# Patient Record
Sex: Female | Born: 1935 | Race: Black or African American | Hispanic: No | Smoking: Former smoker
Health system: Southern US, Community
[De-identification: ages and names within clinical notes are randomized; demographics above are authoritative.]

## PROBLEM LIST (undated history)

## (undated) DIAGNOSIS — I509 Heart failure, unspecified: Secondary | ICD-10-CM

## (undated) DIAGNOSIS — N189 Chronic kidney disease, unspecified: Secondary | ICD-10-CM

## (undated) DIAGNOSIS — E119 Type 2 diabetes mellitus without complications: Secondary | ICD-10-CM

## (undated) DIAGNOSIS — I214 Non-ST elevation (NSTEMI) myocardial infarction: Secondary | ICD-10-CM

## (undated) DIAGNOSIS — R001 Bradycardia, unspecified: Secondary | ICD-10-CM

## (undated) DIAGNOSIS — Z515 Encounter for palliative care: Secondary | ICD-10-CM

## (undated) DIAGNOSIS — G459 Transient cerebral ischemic attack, unspecified: Secondary | ICD-10-CM

## (undated) DIAGNOSIS — I2510 Atherosclerotic heart disease of native coronary artery without angina pectoris: Secondary | ICD-10-CM

## (undated) DIAGNOSIS — I219 Acute myocardial infarction, unspecified: Secondary | ICD-10-CM

## (undated) DIAGNOSIS — E785 Hyperlipidemia, unspecified: Secondary | ICD-10-CM

## (undated) DIAGNOSIS — I10 Essential (primary) hypertension: Secondary | ICD-10-CM

## (undated) DIAGNOSIS — M1990 Unspecified osteoarthritis, unspecified site: Secondary | ICD-10-CM

## (undated) HISTORY — PX: EYE SURGERY: SHX253

---

## 1997-06-20 ENCOUNTER — Ambulatory Visit (HOSPITAL_COMMUNITY): Admission: RE | Admit: 1997-06-20 | Discharge: 1997-06-20 | Payer: Self-pay

## 1997-07-17 ENCOUNTER — Ambulatory Visit (HOSPITAL_COMMUNITY): Admission: RE | Admit: 1997-07-17 | Discharge: 1997-07-17 | Payer: Self-pay | Admitting: Internal Medicine

## 1997-08-30 ENCOUNTER — Other Ambulatory Visit: Admission: RE | Admit: 1997-08-30 | Discharge: 1997-08-30 | Payer: Self-pay | Admitting: Internal Medicine

## 1998-07-02 ENCOUNTER — Other Ambulatory Visit: Admission: RE | Admit: 1998-07-02 | Discharge: 1998-07-02 | Payer: Self-pay | Admitting: Family Medicine

## 1998-07-04 ENCOUNTER — Encounter: Payer: Self-pay | Admitting: Internal Medicine

## 1998-07-04 ENCOUNTER — Ambulatory Visit (HOSPITAL_COMMUNITY): Admission: RE | Admit: 1998-07-04 | Discharge: 1998-07-04 | Payer: Self-pay | Admitting: Internal Medicine

## 1999-07-16 ENCOUNTER — Other Ambulatory Visit: Admission: RE | Admit: 1999-07-16 | Discharge: 1999-07-16 | Payer: Self-pay | Admitting: Family Medicine

## 2000-09-06 ENCOUNTER — Ambulatory Visit (HOSPITAL_COMMUNITY): Admission: RE | Admit: 2000-09-06 | Discharge: 2000-09-06 | Payer: Self-pay | Admitting: Family Medicine

## 2001-03-08 ENCOUNTER — Other Ambulatory Visit: Admission: RE | Admit: 2001-03-08 | Discharge: 2001-03-08 | Payer: Self-pay | Admitting: Family Medicine

## 2001-08-24 ENCOUNTER — Ambulatory Visit (HOSPITAL_COMMUNITY): Admission: RE | Admit: 2001-08-24 | Discharge: 2001-08-24 | Payer: Self-pay | Admitting: Obstetrics & Gynecology

## 2001-12-19 ENCOUNTER — Other Ambulatory Visit: Admission: RE | Admit: 2001-12-19 | Discharge: 2001-12-19 | Payer: Self-pay | Admitting: *Deleted

## 2002-12-21 ENCOUNTER — Ambulatory Visit: Admission: RE | Admit: 2002-12-21 | Discharge: 2002-12-21 | Payer: Self-pay | Admitting: Unknown Physician Specialty

## 2002-12-21 ENCOUNTER — Encounter: Payer: Self-pay | Admitting: Cardiovascular Disease

## 2002-12-26 ENCOUNTER — Ambulatory Visit (HOSPITAL_COMMUNITY): Admission: RE | Admit: 2002-12-26 | Discharge: 2002-12-26 | Payer: Self-pay | Admitting: Family Medicine

## 2002-12-26 ENCOUNTER — Encounter: Payer: Self-pay | Admitting: Occupational Therapy

## 2003-01-04 DIAGNOSIS — H903 Sensorineural hearing loss, bilateral: Secondary | ICD-10-CM

## 2003-01-18 ENCOUNTER — Ambulatory Visit (HOSPITAL_COMMUNITY): Admission: RE | Admit: 2003-01-18 | Discharge: 2003-01-18 | Payer: Self-pay | Admitting: Family Medicine

## 2004-02-05 ENCOUNTER — Ambulatory Visit: Payer: Self-pay | Admitting: Family Medicine

## 2004-02-20 ENCOUNTER — Ambulatory Visit (HOSPITAL_COMMUNITY): Admission: RE | Admit: 2004-02-20 | Discharge: 2004-02-20 | Payer: Self-pay | Admitting: Family Medicine

## 2004-02-26 ENCOUNTER — Ambulatory Visit: Payer: Self-pay | Admitting: Family Medicine

## 2004-03-10 ENCOUNTER — Ambulatory Visit: Payer: Self-pay | Admitting: Family Medicine

## 2004-03-12 ENCOUNTER — Ambulatory Visit (HOSPITAL_COMMUNITY): Admission: RE | Admit: 2004-03-12 | Discharge: 2004-03-12 | Payer: Self-pay | Admitting: Family Medicine

## 2004-04-11 ENCOUNTER — Ambulatory Visit: Payer: Self-pay | Admitting: Family Medicine

## 2004-04-23 ENCOUNTER — Ambulatory Visit: Payer: Self-pay | Admitting: Family Medicine

## 2004-06-17 ENCOUNTER — Ambulatory Visit: Payer: Self-pay | Admitting: Family Medicine

## 2004-10-29 ENCOUNTER — Ambulatory Visit: Payer: Self-pay | Admitting: Family Medicine

## 2005-04-10 ENCOUNTER — Ambulatory Visit (HOSPITAL_COMMUNITY): Admission: RE | Admit: 2005-04-10 | Discharge: 2005-04-10 | Payer: Self-pay | Admitting: Family Medicine

## 2005-05-20 ENCOUNTER — Ambulatory Visit: Payer: Self-pay | Admitting: Family Medicine

## 2005-06-01 ENCOUNTER — Ambulatory Visit: Payer: Self-pay | Admitting: Family Medicine

## 2005-06-05 ENCOUNTER — Ambulatory Visit: Payer: Self-pay | Admitting: Family Medicine

## 2005-06-12 ENCOUNTER — Ambulatory Visit: Payer: Self-pay | Admitting: Family Medicine

## 2005-06-24 ENCOUNTER — Ambulatory Visit: Payer: Self-pay | Admitting: Family Medicine

## 2005-08-27 ENCOUNTER — Ambulatory Visit: Payer: Self-pay | Admitting: Family Medicine

## 2005-09-14 ENCOUNTER — Ambulatory Visit: Payer: Self-pay | Admitting: Family Medicine

## 2005-09-18 ENCOUNTER — Ambulatory Visit: Payer: Self-pay | Admitting: Cardiology

## 2005-09-21 ENCOUNTER — Ambulatory Visit: Payer: Self-pay | Admitting: Family Medicine

## 2005-10-08 ENCOUNTER — Ambulatory Visit: Payer: Self-pay

## 2005-10-08 ENCOUNTER — Encounter: Payer: Self-pay | Admitting: Cardiology

## 2005-10-09 ENCOUNTER — Ambulatory Visit: Payer: Self-pay | Admitting: Family Medicine

## 2005-10-12 ENCOUNTER — Ambulatory Visit: Payer: Self-pay | Admitting: Family Medicine

## 2005-10-23 ENCOUNTER — Ambulatory Visit: Payer: Self-pay | Admitting: Cardiology

## 2005-11-26 ENCOUNTER — Ambulatory Visit: Payer: Self-pay | Admitting: Family Medicine

## 2006-01-14 ENCOUNTER — Ambulatory Visit: Payer: Self-pay | Admitting: Family Medicine

## 2006-01-21 ENCOUNTER — Ambulatory Visit: Payer: Self-pay | Admitting: Family Medicine

## 2006-02-22 ENCOUNTER — Encounter: Admission: RE | Admit: 2006-02-22 | Discharge: 2006-02-22 | Payer: Self-pay | Admitting: Nephrology

## 2006-03-18 ENCOUNTER — Ambulatory Visit: Payer: Self-pay | Admitting: Family Medicine

## 2006-05-06 ENCOUNTER — Ambulatory Visit: Payer: Self-pay | Admitting: Internal Medicine

## 2006-05-12 ENCOUNTER — Ambulatory Visit: Payer: Self-pay | Admitting: Family Medicine

## 2006-08-27 ENCOUNTER — Inpatient Hospital Stay (HOSPITAL_COMMUNITY): Admission: EM | Admit: 2006-08-27 | Discharge: 2006-08-31 | Payer: Self-pay | Admitting: Emergency Medicine

## 2006-08-27 ENCOUNTER — Ambulatory Visit: Payer: Self-pay | Admitting: Cardiology

## 2006-08-27 DIAGNOSIS — Z9889 Other specified postprocedural states: Secondary | ICD-10-CM

## 2006-08-27 DIAGNOSIS — I214 Non-ST elevation (NSTEMI) myocardial infarction: Secondary | ICD-10-CM

## 2006-08-27 HISTORY — DX: Non-ST elevation (NSTEMI) myocardial infarction: I21.4

## 2006-09-03 ENCOUNTER — Ambulatory Visit: Payer: Self-pay | Admitting: Cardiology

## 2006-09-03 LAB — CONVERTED CEMR LAB
CO2: 32 meq/L (ref 19–32)
Calcium: 9.3 mg/dL (ref 8.4–10.5)
Chloride: 105 meq/L (ref 96–112)
Creatinine, Ser: 1.2 mg/dL (ref 0.4–1.2)
GFR calc Af Amer: 56 mL/min
Glucose, Bld: 107 mg/dL — ABNORMAL HIGH (ref 70–99)

## 2006-09-07 ENCOUNTER — Ambulatory Visit: Payer: Self-pay | Admitting: Cardiology

## 2006-09-07 LAB — CONVERTED CEMR LAB
CO2: 29 meq/L (ref 19–32)
Chloride: 106 meq/L (ref 96–112)
Creatinine, Ser: 1.2 mg/dL (ref 0.4–1.2)
Glucose, Bld: 112 mg/dL — ABNORMAL HIGH (ref 70–99)
Sodium: 140 meq/L (ref 135–145)

## 2006-09-14 ENCOUNTER — Ambulatory Visit: Payer: Self-pay | Admitting: Family Medicine

## 2006-10-01 ENCOUNTER — Ambulatory Visit: Payer: Self-pay | Admitting: Cardiology

## 2006-10-01 ENCOUNTER — Observation Stay (HOSPITAL_COMMUNITY): Admission: EM | Admit: 2006-10-01 | Discharge: 2006-10-02 | Payer: Self-pay | Admitting: Emergency Medicine

## 2006-10-08 ENCOUNTER — Ambulatory Visit: Payer: Self-pay

## 2006-10-08 ENCOUNTER — Ambulatory Visit: Payer: Self-pay | Admitting: Cardiology

## 2006-10-08 LAB — CONVERTED CEMR LAB
Eosinophils Absolute: 0.2 10*3/uL (ref 0.0–0.6)
Lymphocytes Relative: 35.5 % (ref 12.0–46.0)
MCHC: 33.9 g/dL (ref 30.0–36.0)
MCV: 95.6 fL (ref 78.0–100.0)
Monocytes Absolute: 0.4 10*3/uL (ref 0.2–0.7)
Monocytes Relative: 6.9 % (ref 3.0–11.0)
Neutro Abs: 3.1 10*3/uL (ref 1.4–7.7)
Platelets: 191 10*3/uL (ref 150–400)

## 2006-10-22 ENCOUNTER — Ambulatory Visit: Payer: Self-pay | Admitting: Cardiology

## 2006-11-16 ENCOUNTER — Ambulatory Visit: Payer: Self-pay | Admitting: Cardiology

## 2006-11-29 ENCOUNTER — Ambulatory Visit: Payer: Self-pay | Admitting: Family Medicine

## 2006-12-15 ENCOUNTER — Emergency Department (HOSPITAL_COMMUNITY): Admission: EM | Admit: 2006-12-15 | Discharge: 2006-12-15 | Payer: Self-pay | Admitting: Emergency Medicine

## 2006-12-16 ENCOUNTER — Ambulatory Visit: Payer: Self-pay | Admitting: Cardiology

## 2006-12-21 ENCOUNTER — Ambulatory Visit: Payer: Self-pay | Admitting: Family Medicine

## 2006-12-21 DIAGNOSIS — I219 Acute myocardial infarction, unspecified: Secondary | ICD-10-CM | POA: Insufficient documentation

## 2006-12-21 DIAGNOSIS — K5730 Diverticulosis of large intestine without perforation or abscess without bleeding: Secondary | ICD-10-CM | POA: Insufficient documentation

## 2006-12-21 DIAGNOSIS — D649 Anemia, unspecified: Secondary | ICD-10-CM

## 2006-12-21 DIAGNOSIS — E785 Hyperlipidemia, unspecified: Secondary | ICD-10-CM

## 2006-12-21 DIAGNOSIS — E669 Obesity, unspecified: Secondary | ICD-10-CM

## 2006-12-21 DIAGNOSIS — R609 Edema, unspecified: Secondary | ICD-10-CM

## 2006-12-21 DIAGNOSIS — E119 Type 2 diabetes mellitus without complications: Secondary | ICD-10-CM

## 2006-12-21 DIAGNOSIS — J45909 Unspecified asthma, uncomplicated: Secondary | ICD-10-CM | POA: Insufficient documentation

## 2006-12-21 DIAGNOSIS — N189 Chronic kidney disease, unspecified: Secondary | ICD-10-CM | POA: Insufficient documentation

## 2006-12-22 ENCOUNTER — Ambulatory Visit: Payer: Self-pay

## 2006-12-22 ENCOUNTER — Encounter: Payer: Self-pay | Admitting: Cardiology

## 2006-12-22 ENCOUNTER — Encounter (INDEPENDENT_AMBULATORY_CARE_PROVIDER_SITE_OTHER): Payer: Self-pay | Admitting: Family Medicine

## 2006-12-22 LAB — CONVERTED CEMR LAB
ALT: 19 units/L (ref 0–35)
AST: 19 units/L (ref 0–37)
Basophils Relative: 0.5 % (ref 0.0–1.0)
Bilirubin, Direct: 0.1 mg/dL (ref 0.0–0.3)
CO2: 32 meq/L (ref 19–32)
Calcium: 9.8 mg/dL (ref 8.4–10.5)
Chloride: 104 meq/L (ref 96–112)
Cholesterol: 192 mg/dL (ref 0–200)
Eosinophils Absolute: 0.2 10*3/uL (ref 0.0–0.6)
Eosinophils Relative: 3.2 % (ref 0.0–5.0)
GFR calc non Af Amer: 39 mL/min
Glucose, Bld: 127 mg/dL — ABNORMAL HIGH (ref 70–99)
HCT: 33.6 % — ABNORMAL LOW (ref 36.0–46.0)
HDL: 34.3 mg/dL — ABNORMAL LOW (ref >39.0)
Lymphocytes Relative: 32.2 % (ref 12.0–46.0)
MCV: 94.7 fL (ref 78.0–100.0)
Neutrophils Relative %: 54.6 % (ref 43.0–77.0)
Prothrombin Time: 12.6 s (ref 10.9–13.3)
RBC: 3.55 M/uL — ABNORMAL LOW (ref 3.87–5.11)
Sodium: 141 meq/L (ref 135–145)
Total Bilirubin: 0.8 mg/dL (ref 0.3–1.2)
Total Protein: 7.1 g/dL (ref 6.0–8.3)
VLDL: 25 mg/dL (ref 0–40)
WBC: 5.3 10*3/uL (ref 4.5–10.5)
aPTT: 25.9 s (ref 21.7–29.8)

## 2006-12-23 ENCOUNTER — Ambulatory Visit: Payer: Self-pay | Admitting: Cardiology

## 2006-12-23 ENCOUNTER — Inpatient Hospital Stay (HOSPITAL_BASED_OUTPATIENT_CLINIC_OR_DEPARTMENT_OTHER): Admission: RE | Admit: 2006-12-23 | Discharge: 2006-12-23 | Payer: Self-pay | Admitting: Cardiology

## 2006-12-23 ENCOUNTER — Inpatient Hospital Stay (HOSPITAL_COMMUNITY): Admission: AD | Admit: 2006-12-23 | Discharge: 2006-12-28 | Payer: Self-pay | Admitting: Cardiology

## 2006-12-23 HISTORY — PX: CARDIAC CATHETERIZATION: SHX172

## 2007-01-04 ENCOUNTER — Ambulatory Visit: Payer: Self-pay | Admitting: Cardiology

## 2007-01-10 ENCOUNTER — Ambulatory Visit: Payer: Self-pay | Admitting: Cardiology

## 2007-01-19 ENCOUNTER — Telehealth (INDEPENDENT_AMBULATORY_CARE_PROVIDER_SITE_OTHER): Payer: Self-pay | Admitting: *Deleted

## 2007-02-11 ENCOUNTER — Encounter (INDEPENDENT_AMBULATORY_CARE_PROVIDER_SITE_OTHER): Payer: Self-pay | Admitting: Family Medicine

## 2007-02-18 ENCOUNTER — Telehealth (INDEPENDENT_AMBULATORY_CARE_PROVIDER_SITE_OTHER): Payer: Self-pay | Admitting: *Deleted

## 2007-02-24 ENCOUNTER — Encounter (INDEPENDENT_AMBULATORY_CARE_PROVIDER_SITE_OTHER): Payer: Self-pay | Admitting: Family Medicine

## 2007-03-01 ENCOUNTER — Ambulatory Visit: Payer: Self-pay | Admitting: Cardiology

## 2007-03-01 ENCOUNTER — Encounter (INDEPENDENT_AMBULATORY_CARE_PROVIDER_SITE_OTHER): Payer: Self-pay | Admitting: Family Medicine

## 2007-03-02 ENCOUNTER — Ambulatory Visit: Payer: Self-pay | Admitting: Family Medicine

## 2007-03-04 ENCOUNTER — Encounter (INDEPENDENT_AMBULATORY_CARE_PROVIDER_SITE_OTHER): Payer: Self-pay | Admitting: Family Medicine

## 2007-03-09 ENCOUNTER — Encounter (INDEPENDENT_AMBULATORY_CARE_PROVIDER_SITE_OTHER): Payer: Self-pay | Admitting: Family Medicine

## 2007-03-15 ENCOUNTER — Ambulatory Visit: Payer: Self-pay | Admitting: Cardiology

## 2007-03-15 ENCOUNTER — Inpatient Hospital Stay (HOSPITAL_COMMUNITY): Admission: EM | Admit: 2007-03-15 | Discharge: 2007-03-24 | Payer: Self-pay | Admitting: Emergency Medicine

## 2007-03-16 ENCOUNTER — Encounter: Payer: Self-pay | Admitting: Cardiothoracic Surgery

## 2007-03-16 ENCOUNTER — Ambulatory Visit: Payer: Self-pay | Admitting: Cardiothoracic Surgery

## 2007-03-16 ENCOUNTER — Ambulatory Visit: Payer: Self-pay | Admitting: Vascular Surgery

## 2007-03-16 HISTORY — PX: CARDIAC CATHETERIZATION: SHX172

## 2007-03-18 HISTORY — PX: CORONARY ANGIOPLASTY WITH STENT PLACEMENT: SHX49

## 2007-03-18 HISTORY — PX: CORONARY ARTERY BYPASS GRAFT: SHX141

## 2007-04-21 ENCOUNTER — Encounter: Admission: RE | Admit: 2007-04-21 | Discharge: 2007-04-21 | Payer: Self-pay | Admitting: Cardiothoracic Surgery

## 2007-04-21 ENCOUNTER — Ambulatory Visit: Payer: Self-pay | Admitting: Cardiothoracic Surgery

## 2007-04-25 ENCOUNTER — Ambulatory Visit: Payer: Self-pay | Admitting: Family Medicine

## 2007-04-26 ENCOUNTER — Ambulatory Visit: Payer: Self-pay | Admitting: Cardiology

## 2007-04-26 ENCOUNTER — Encounter (INDEPENDENT_AMBULATORY_CARE_PROVIDER_SITE_OTHER): Payer: Self-pay | Admitting: Family Medicine

## 2007-04-29 DIAGNOSIS — I2510 Atherosclerotic heart disease of native coronary artery without angina pectoris: Secondary | ICD-10-CM | POA: Insufficient documentation

## 2007-05-05 DIAGNOSIS — I219 Acute myocardial infarction, unspecified: Secondary | ICD-10-CM

## 2007-05-05 HISTORY — DX: Acute myocardial infarction, unspecified: I21.9

## 2007-06-09 ENCOUNTER — Encounter (INDEPENDENT_AMBULATORY_CARE_PROVIDER_SITE_OTHER): Payer: Self-pay | Admitting: Family Medicine

## 2007-06-23 ENCOUNTER — Ambulatory Visit: Payer: Self-pay | Admitting: Cardiology

## 2007-07-01 ENCOUNTER — Encounter (INDEPENDENT_AMBULATORY_CARE_PROVIDER_SITE_OTHER): Payer: Self-pay | Admitting: Family Medicine

## 2007-07-01 ENCOUNTER — Telehealth (INDEPENDENT_AMBULATORY_CARE_PROVIDER_SITE_OTHER): Payer: Self-pay | Admitting: *Deleted

## 2007-07-07 ENCOUNTER — Encounter (INDEPENDENT_AMBULATORY_CARE_PROVIDER_SITE_OTHER): Payer: Self-pay | Admitting: Family Medicine

## 2007-07-08 ENCOUNTER — Ambulatory Visit: Payer: Self-pay | Admitting: Cardiology

## 2007-07-08 LAB — CONVERTED CEMR LAB
BUN: 22 mg/dL (ref 6–23)
CO2: 34 meq/L — ABNORMAL HIGH (ref 19–32)
Calcium: 9.6 mg/dL (ref 8.4–10.5)
Chloride: 105 meq/L (ref 96–112)
Creatinine, Ser: 1.1 mg/dL (ref 0.4–1.2)
Pro B Natriuretic peptide (BNP): 328 pg/mL — ABNORMAL HIGH (ref 0.0–100.0)

## 2007-08-15 ENCOUNTER — Ambulatory Visit: Payer: Self-pay | Admitting: Family Medicine

## 2007-08-15 LAB — CONVERTED CEMR LAB: Blood Glucose, Fingerstick: 86

## 2007-08-18 ENCOUNTER — Encounter (INDEPENDENT_AMBULATORY_CARE_PROVIDER_SITE_OTHER): Payer: Self-pay | Admitting: Family Medicine

## 2007-08-18 ENCOUNTER — Ambulatory Visit: Payer: Self-pay | Admitting: Cardiology

## 2007-09-01 LAB — CONVERTED CEMR LAB
CO2: 33 meq/L — ABNORMAL HIGH (ref 19–32)
Chloride: 102 meq/L (ref 96–112)
Creatinine, Ser: 1.2 mg/dL (ref 0.4–1.2)
GFR calc non Af Amer: 46 mL/min
Potassium: 4.5 meq/L (ref 3.5–5.1)
Sodium: 139 meq/L (ref 135–145)

## 2007-09-02 ENCOUNTER — Ambulatory Visit: Payer: Self-pay | Admitting: Cardiology

## 2007-09-22 ENCOUNTER — Ambulatory Visit: Payer: Self-pay | Admitting: Cardiology

## 2007-10-10 ENCOUNTER — Encounter (INDEPENDENT_AMBULATORY_CARE_PROVIDER_SITE_OTHER): Payer: Self-pay | Admitting: Family Medicine

## 2007-10-17 ENCOUNTER — Ambulatory Visit (HOSPITAL_COMMUNITY): Admission: RE | Admit: 2007-10-17 | Discharge: 2007-10-17 | Payer: Self-pay | Admitting: Ophthalmology

## 2007-10-17 ENCOUNTER — Encounter (INDEPENDENT_AMBULATORY_CARE_PROVIDER_SITE_OTHER): Payer: Self-pay | Admitting: Family Medicine

## 2007-10-17 HISTORY — PX: CATARACT EXTRACTION W/ INTRAOCULAR LENS IMPLANT: SHX1309

## 2007-10-24 ENCOUNTER — Ambulatory Visit: Payer: Self-pay | Admitting: Cardiology

## 2007-10-25 ENCOUNTER — Encounter (INDEPENDENT_AMBULATORY_CARE_PROVIDER_SITE_OTHER): Payer: Self-pay | Admitting: Family Medicine

## 2007-12-15 ENCOUNTER — Encounter (HOSPITAL_COMMUNITY): Admission: RE | Admit: 2007-12-15 | Discharge: 2008-03-14 | Payer: Self-pay | Admitting: Cardiology

## 2007-12-27 ENCOUNTER — Ambulatory Visit: Payer: Self-pay | Admitting: Family Medicine

## 2007-12-27 LAB — CONVERTED CEMR LAB
Blood Glucose, Fingerstick: 146
Hgb A1c MFr Bld: 6 %

## 2007-12-28 ENCOUNTER — Encounter (INDEPENDENT_AMBULATORY_CARE_PROVIDER_SITE_OTHER): Payer: Self-pay | Admitting: Family Medicine

## 2007-12-28 LAB — CONVERTED CEMR LAB: Microalb, Ur: 0.2 mg/dL (ref 0.00–1.89)

## 2008-01-06 LAB — CONVERTED CEMR LAB
ALT: 17 units/L (ref 0–35)
AST: 20 units/L (ref 0–37)
Albumin: 4.3 g/dL (ref 3.5–5.2)
Basophils Absolute: 0 10*3/uL (ref 0.0–0.1)
Basophils Relative: 0 % (ref 0–1)
CO2: 28 meq/L (ref 19–32)
Calcium: 9.5 mg/dL (ref 8.4–10.5)
Chloride: 105 meq/L (ref 96–112)
Cholesterol: 162 mg/dL (ref 0–200)
Hemoglobin: 11.8 g/dL — ABNORMAL LOW (ref 12.0–15.0)
Lymphocytes Relative: 31 % (ref 12–46)
MCHC: 32.1 g/dL (ref 30.0–36.0)
Monocytes Absolute: 0.4 10*3/uL (ref 0.1–1.0)
Monocytes Relative: 8 % (ref 3–12)
Neutro Abs: 3.2 10*3/uL (ref 1.7–7.7)
Neutrophils Relative %: 59 % (ref 43–77)
Potassium: 4.9 meq/L (ref 3.5–5.3)
RBC: 3.78 M/uL — ABNORMAL LOW (ref 3.87–5.11)
TSH: 1.378 microintl units/mL (ref 0.350–4.50)

## 2008-02-01 ENCOUNTER — Encounter (INDEPENDENT_AMBULATORY_CARE_PROVIDER_SITE_OTHER): Payer: Self-pay | Admitting: Family Medicine

## 2008-02-10 ENCOUNTER — Ambulatory Visit: Payer: Self-pay | Admitting: Cardiology

## 2008-02-22 ENCOUNTER — Ambulatory Visit: Payer: Self-pay | Admitting: Cardiology

## 2008-02-22 LAB — CONVERTED CEMR LAB
CO2: 34 meq/L — ABNORMAL HIGH (ref 19–32)
Chloride: 104 meq/L (ref 96–112)
GFR calc non Af Amer: 39 mL/min
Potassium: 4.4 meq/L (ref 3.5–5.1)
Sodium: 143 meq/L (ref 135–145)

## 2008-03-12 ENCOUNTER — Ambulatory Visit: Payer: Self-pay | Admitting: Family Medicine

## 2008-03-12 DIAGNOSIS — M545 Low back pain: Secondary | ICD-10-CM

## 2008-03-12 LAB — CONVERTED CEMR LAB: Blood Glucose, Fingerstick: 199

## 2008-03-14 ENCOUNTER — Encounter (INDEPENDENT_AMBULATORY_CARE_PROVIDER_SITE_OTHER): Payer: Self-pay | Admitting: Family Medicine

## 2008-03-25 DIAGNOSIS — I5042 Chronic combined systolic (congestive) and diastolic (congestive) heart failure: Secondary | ICD-10-CM | POA: Insufficient documentation

## 2008-03-28 ENCOUNTER — Ambulatory Visit: Payer: Self-pay | Admitting: Family Medicine

## 2008-03-28 LAB — CONVERTED CEMR LAB
Chloride: 102 meq/L (ref 96–112)
Creatinine, Ser: 1.27 mg/dL — ABNORMAL HIGH (ref 0.40–1.20)
Potassium: 4.9 meq/L (ref 3.5–5.3)
Sodium: 139 meq/L (ref 135–145)

## 2008-04-13 ENCOUNTER — Encounter (INDEPENDENT_AMBULATORY_CARE_PROVIDER_SITE_OTHER): Payer: Self-pay | Admitting: Family Medicine

## 2008-04-18 ENCOUNTER — Ambulatory Visit: Payer: Self-pay | Admitting: Family Medicine

## 2008-04-18 LAB — CONVERTED CEMR LAB: Hgb A1c MFr Bld: 6.2 %

## 2008-05-08 ENCOUNTER — Ambulatory Visit (HOSPITAL_COMMUNITY): Admission: RE | Admit: 2008-05-08 | Discharge: 2008-05-08 | Payer: Self-pay | Admitting: Family Medicine

## 2008-05-10 ENCOUNTER — Encounter (INDEPENDENT_AMBULATORY_CARE_PROVIDER_SITE_OTHER): Payer: Self-pay | Admitting: Family Medicine

## 2008-06-05 ENCOUNTER — Encounter: Payer: Self-pay | Admitting: Cardiology

## 2008-06-05 ENCOUNTER — Ambulatory Visit: Payer: Self-pay | Admitting: Cardiology

## 2008-06-05 DIAGNOSIS — I6529 Occlusion and stenosis of unspecified carotid artery: Secondary | ICD-10-CM

## 2008-06-12 ENCOUNTER — Telehealth (INDEPENDENT_AMBULATORY_CARE_PROVIDER_SITE_OTHER): Payer: Self-pay | Admitting: *Deleted

## 2008-06-13 ENCOUNTER — Telehealth (INDEPENDENT_AMBULATORY_CARE_PROVIDER_SITE_OTHER): Payer: Self-pay | Admitting: *Deleted

## 2008-06-15 ENCOUNTER — Encounter (INDEPENDENT_AMBULATORY_CARE_PROVIDER_SITE_OTHER): Payer: Self-pay | Admitting: Family Medicine

## 2008-07-11 ENCOUNTER — Telehealth (INDEPENDENT_AMBULATORY_CARE_PROVIDER_SITE_OTHER): Payer: Self-pay | Admitting: *Deleted

## 2008-07-12 ENCOUNTER — Encounter (INDEPENDENT_AMBULATORY_CARE_PROVIDER_SITE_OTHER): Payer: Self-pay | Admitting: Family Medicine

## 2008-07-25 ENCOUNTER — Emergency Department (HOSPITAL_COMMUNITY): Admission: EM | Admit: 2008-07-25 | Discharge: 2008-07-25 | Payer: Self-pay | Admitting: Emergency Medicine

## 2008-07-25 ENCOUNTER — Encounter (INDEPENDENT_AMBULATORY_CARE_PROVIDER_SITE_OTHER): Payer: Self-pay | Admitting: Family Medicine

## 2008-07-27 ENCOUNTER — Encounter (INDEPENDENT_AMBULATORY_CARE_PROVIDER_SITE_OTHER): Payer: Self-pay | Admitting: Family Medicine

## 2008-07-27 ENCOUNTER — Emergency Department (HOSPITAL_COMMUNITY): Admission: EM | Admit: 2008-07-27 | Discharge: 2008-07-27 | Payer: Self-pay | Admitting: Family Medicine

## 2008-09-12 ENCOUNTER — Ambulatory Visit: Payer: Self-pay | Admitting: Cardiology

## 2008-09-12 DIAGNOSIS — E7800 Pure hypercholesterolemia, unspecified: Secondary | ICD-10-CM

## 2008-09-12 LAB — CONVERTED CEMR LAB
ALT: 15 units/L (ref 0–35)
Albumin: 3.6 g/dL (ref 3.5–5.2)
Bilirubin, Direct: 0.1 mg/dL (ref 0.0–0.3)
Cholesterol: 150 mg/dL (ref 0–200)
HDL: 43.4 mg/dL (ref >39.00)
Total Protein: 7.1 g/dL (ref 6.0–8.3)
Triglycerides: 82 mg/dL (ref 0.0–149.0)
VLDL: 16.4 mg/dL (ref 0.0–40.0)

## 2008-09-13 ENCOUNTER — Ambulatory Visit: Payer: Self-pay

## 2008-09-13 ENCOUNTER — Ambulatory Visit: Payer: Self-pay | Admitting: Cardiology

## 2008-09-13 ENCOUNTER — Encounter: Payer: Self-pay | Admitting: Cardiology

## 2008-09-19 ENCOUNTER — Encounter: Payer: Self-pay | Admitting: Cardiology

## 2008-09-20 ENCOUNTER — Encounter (INDEPENDENT_AMBULATORY_CARE_PROVIDER_SITE_OTHER): Payer: Self-pay | Admitting: Family Medicine

## 2008-09-26 ENCOUNTER — Ambulatory Visit: Payer: Self-pay | Admitting: Family Medicine

## 2008-09-26 LAB — CONVERTED CEMR LAB: Hgb A1c MFr Bld: 6.2 %

## 2008-10-11 DIAGNOSIS — Z8719 Personal history of other diseases of the digestive system: Secondary | ICD-10-CM | POA: Insufficient documentation

## 2008-10-12 ENCOUNTER — Inpatient Hospital Stay (HOSPITAL_COMMUNITY): Admission: EM | Admit: 2008-10-12 | Discharge: 2008-10-15 | Payer: Self-pay | Admitting: Emergency Medicine

## 2008-10-12 ENCOUNTER — Ambulatory Visit: Payer: Self-pay | Admitting: Cardiology

## 2008-10-13 ENCOUNTER — Ambulatory Visit: Payer: Self-pay | Admitting: Internal Medicine

## 2008-10-16 ENCOUNTER — Telehealth: Payer: Self-pay | Admitting: Cardiology

## 2008-10-17 ENCOUNTER — Telehealth (INDEPENDENT_AMBULATORY_CARE_PROVIDER_SITE_OTHER): Payer: Self-pay | Admitting: Internal Medicine

## 2008-10-19 ENCOUNTER — Encounter (INDEPENDENT_AMBULATORY_CARE_PROVIDER_SITE_OTHER): Payer: Self-pay | Admitting: Family Medicine

## 2008-10-22 ENCOUNTER — Telehealth (INDEPENDENT_AMBULATORY_CARE_PROVIDER_SITE_OTHER): Payer: Self-pay | Admitting: Family Medicine

## 2008-10-22 ENCOUNTER — Encounter (INDEPENDENT_AMBULATORY_CARE_PROVIDER_SITE_OTHER): Payer: Self-pay | Admitting: Nurse Practitioner

## 2008-10-31 ENCOUNTER — Encounter (INDEPENDENT_AMBULATORY_CARE_PROVIDER_SITE_OTHER): Payer: Self-pay | Admitting: Internal Medicine

## 2008-11-06 ENCOUNTER — Encounter (INDEPENDENT_AMBULATORY_CARE_PROVIDER_SITE_OTHER): Payer: Self-pay | Admitting: Internal Medicine

## 2008-11-06 ENCOUNTER — Ambulatory Visit: Payer: Self-pay | Admitting: Nurse Practitioner

## 2008-11-06 LAB — CONVERTED CEMR LAB
Basophils Absolute: 0 10*3/uL (ref 0.0–0.1)
Basophils Relative: 0 % (ref 0–1)
Eosinophils Relative: 2 % (ref 0–5)
Hemoglobin: 10.2 g/dL — ABNORMAL LOW (ref 12.0–15.0)
MCHC: 30.4 g/dL (ref 30.0–36.0)
Monocytes Absolute: 0.5 10*3/uL (ref 0.1–1.0)
Neutro Abs: 3.8 10*3/uL (ref 1.7–7.7)
RDW: 13.5 % (ref 11.5–15.5)

## 2008-11-07 ENCOUNTER — Encounter (INDEPENDENT_AMBULATORY_CARE_PROVIDER_SITE_OTHER): Payer: Self-pay | Admitting: Nurse Practitioner

## 2008-11-19 ENCOUNTER — Encounter: Payer: Self-pay | Admitting: Internal Medicine

## 2008-12-06 ENCOUNTER — Encounter: Payer: Self-pay | Admitting: Physician Assistant

## 2008-12-28 ENCOUNTER — Encounter (INDEPENDENT_AMBULATORY_CARE_PROVIDER_SITE_OTHER): Payer: Self-pay | Admitting: Internal Medicine

## 2009-01-01 ENCOUNTER — Encounter (INDEPENDENT_AMBULATORY_CARE_PROVIDER_SITE_OTHER): Payer: Self-pay | Admitting: *Deleted

## 2009-02-28 ENCOUNTER — Ambulatory Visit: Payer: Self-pay | Admitting: Physician Assistant

## 2009-02-28 LAB — CONVERTED CEMR LAB: Blood Glucose, Fingerstick: 117

## 2009-03-04 ENCOUNTER — Ambulatory Visit: Payer: Self-pay | Admitting: Internal Medicine

## 2009-03-04 DIAGNOSIS — R5383 Other fatigue: Secondary | ICD-10-CM

## 2009-03-04 DIAGNOSIS — R5381 Other malaise: Secondary | ICD-10-CM | POA: Insufficient documentation

## 2009-03-04 DIAGNOSIS — I5032 Chronic diastolic (congestive) heart failure: Secondary | ICD-10-CM

## 2009-03-05 ENCOUNTER — Encounter: Payer: Self-pay | Admitting: Physician Assistant

## 2009-03-27 ENCOUNTER — Ambulatory Visit: Payer: Self-pay | Admitting: Physician Assistant

## 2009-04-02 ENCOUNTER — Ambulatory Visit: Payer: Self-pay | Admitting: Physician Assistant

## 2009-04-02 DIAGNOSIS — R269 Unspecified abnormalities of gait and mobility: Secondary | ICD-10-CM | POA: Insufficient documentation

## 2009-04-02 LAB — CONVERTED CEMR LAB
CO2: 30 meq/L (ref 19–32)
Calcium: 9.4 mg/dL (ref 8.4–10.5)
Creatinine, Ser: 1.74 mg/dL — ABNORMAL HIGH (ref 0.40–1.20)
Glucose, Bld: 100 mg/dL — ABNORMAL HIGH (ref 70–99)

## 2009-04-03 ENCOUNTER — Encounter: Payer: Self-pay | Admitting: Physician Assistant

## 2009-04-19 ENCOUNTER — Ambulatory Visit (HOSPITAL_COMMUNITY): Admission: RE | Admit: 2009-04-19 | Discharge: 2009-04-19 | Payer: Self-pay | Admitting: Internal Medicine

## 2009-04-19 ENCOUNTER — Encounter: Payer: Self-pay | Admitting: Physician Assistant

## 2009-05-02 ENCOUNTER — Encounter: Admission: RE | Admit: 2009-05-02 | Discharge: 2009-05-07 | Payer: Self-pay | Admitting: Physician Assistant

## 2009-05-07 ENCOUNTER — Encounter: Admission: RE | Admit: 2009-05-07 | Discharge: 2009-05-31 | Payer: Self-pay | Admitting: Physician Assistant

## 2009-05-13 ENCOUNTER — Encounter: Payer: Self-pay | Admitting: Physician Assistant

## 2009-05-15 ENCOUNTER — Encounter: Payer: Self-pay | Admitting: Physician Assistant

## 2009-05-15 DIAGNOSIS — M810 Age-related osteoporosis without current pathological fracture: Secondary | ICD-10-CM | POA: Insufficient documentation

## 2009-05-16 ENCOUNTER — Encounter: Payer: Self-pay | Admitting: Physician Assistant

## 2009-05-19 ENCOUNTER — Encounter: Payer: Self-pay | Admitting: Physician Assistant

## 2009-05-30 ENCOUNTER — Encounter: Payer: Self-pay | Admitting: Physician Assistant

## 2009-05-30 ENCOUNTER — Encounter (INDEPENDENT_AMBULATORY_CARE_PROVIDER_SITE_OTHER): Payer: Self-pay | Admitting: *Deleted

## 2009-05-31 ENCOUNTER — Encounter: Payer: Self-pay | Admitting: Physician Assistant

## 2009-07-11 ENCOUNTER — Encounter: Payer: Self-pay | Admitting: Physician Assistant

## 2009-07-15 ENCOUNTER — Encounter: Payer: Self-pay | Admitting: Physician Assistant

## 2009-07-23 ENCOUNTER — Encounter: Payer: Self-pay | Admitting: Physician Assistant

## 2009-07-26 ENCOUNTER — Telehealth: Payer: Self-pay | Admitting: Physician Assistant

## 2009-08-15 ENCOUNTER — Ambulatory Visit: Payer: Self-pay | Admitting: Internal Medicine

## 2010-03-12 ENCOUNTER — Ambulatory Visit: Payer: Self-pay | Admitting: Nurse Practitioner

## 2010-03-20 ENCOUNTER — Ambulatory Visit: Payer: Self-pay | Admitting: Nurse Practitioner

## 2010-03-20 DIAGNOSIS — I10 Essential (primary) hypertension: Secondary | ICD-10-CM

## 2010-03-20 LAB — CONVERTED CEMR LAB
HDL goal, serum: 40 mg/dL
Hgb A1c MFr Bld: 6.1 %

## 2010-03-21 LAB — CONVERTED CEMR LAB
ALT: 15 units/L (ref 0–35)
Albumin: 4.2 g/dL (ref 3.5–5.2)
Basophils Absolute: 0 10*3/uL (ref 0.0–0.1)
CO2: 29 meq/L (ref 19–32)
Calcium: 8.9 mg/dL (ref 8.4–10.5)
Chloride: 101 meq/L (ref 96–112)
Creatinine, Ser: 1.24 mg/dL — ABNORMAL HIGH (ref 0.40–1.20)
Hemoglobin: 11.7 g/dL — ABNORMAL LOW (ref 12.0–15.0)
Lymphocytes Relative: 33 % (ref 12–46)
Monocytes Absolute: 0.7 10*3/uL (ref 0.1–1.0)
Monocytes Relative: 12 % (ref 3–12)
Neutro Abs: 3.2 10*3/uL (ref 1.7–7.7)
RBC: 3.64 M/uL — ABNORMAL LOW (ref 3.87–5.11)
RDW: 12.8 % (ref 11.5–15.5)
TSH: 1.064 microintl units/mL (ref 0.350–4.500)

## 2010-04-03 ENCOUNTER — Ambulatory Visit: Payer: Self-pay | Admitting: Internal Medicine

## 2010-04-18 ENCOUNTER — Ambulatory Visit: Payer: Self-pay | Admitting: Nurse Practitioner

## 2010-05-20 ENCOUNTER — Encounter (INDEPENDENT_AMBULATORY_CARE_PROVIDER_SITE_OTHER): Payer: Self-pay | Admitting: Internal Medicine

## 2010-05-24 ENCOUNTER — Encounter: Payer: Self-pay | Admitting: Occupational Therapy

## 2010-06-01 LAB — CONVERTED CEMR LAB
Blood Glucose, Fingerstick: 117
Nitrite: NEGATIVE
Specific Gravity, Urine: 1.025
WBC Urine, dipstick: NEGATIVE
pH: 6

## 2010-06-03 NOTE — Progress Notes (Signed)
Summary: Office Visit/DEPRESSION SCREENING  Office Visit/DEPRESSION SCREENING   Imported By: Arta Bruce 05/24/2009 09:59:31  _____________________________________________________________________  External Attachment:    Type:   Image     Comment:   External Document

## 2010-06-03 NOTE — Miscellaneous (Signed)
Summary: Rehab Report//DISCHARGE  Rehab Report//DISCHARGE   Imported By: Arta Bruce 07/29/2009 16:07:06  _____________________________________________________________________  External Attachment:    Type:   Image     Comment:   External Document

## 2010-06-03 NOTE — Miscellaneous (Signed)
Summary: Colonscopy 12/2008:  Needs Repeat 12/2013  Clinical Lists Changes  Observations: Added new observation of COLONRECACT: Repeat colonoscopy in 5 years.  (12/06/2008 11:03) Added new observation of COLONOSCOPY:  Results: Sessile Polyp.  Results: Hemorrhoids. ("medium")    Results: Diverticulosis.       Pathology:  Adenomatous polyp.  (Tubular Adenoma)      Location:  [facility].  (Dr. Jeani Hawking)  Performed for diverticular bleeding  (12/06/2008 11:03)      Colonoscopy  Procedure date:  12/06/2008  Findings:       Results: Sessile Polyp.  Results: Hemorrhoids. ('medium')    Results: Diverticulosis.       Pathology:  Adenomatous polyp.  (Tubular Adenoma)      Location:  [facility].  (Dr. Jeani Hawking)  Performed for diverticular bleeding   Comments:      Repeat colonoscopy in 5 years.

## 2010-06-03 NOTE — Miscellaneous (Signed)
Summary: Rehab Report//INITIAL SUMMARY  Rehab Report//INITIAL SUMMARY   Imported By: Arta Bruce 07/11/2009 15:57:00  _____________________________________________________________________  External Attachment:    Type:   Image     Comment:   External Document

## 2010-06-03 NOTE — Assessment & Plan Note (Signed)
Summary: HAD TO GO TO ER///[place]   Vital Signs:  Patient profile:   77 year old female Height:      61.5 inches Weight:      229 pounds BMI:     42.72 Temp:     97.2 degrees F oral Pulse rate:   76 / minute Pulse rhythm:   regular Resp:     18 per minute BP sitting:   160 / 82  (right arm) Cuff size:   large  Vitals Entered By: [place] Shannon (Sep 26, 2008 10:34 AM)  Serial Vital Signs/Assessments:  Time      Position  BP       Pulse  Resp  Temp     By 11:17 AM            130/64   80                    Beverley Fiedler MD  CC: pt is here for f/u...Marland KitchenMarland KitchenMarland Kitchen   Pain Assessment Patient in pain? yes     Location: joint Intensity: 6 Type: aching Onset of pain  Constant CBG Result 158 Comments     Primary Care Provider:  [facility]  CC:  pt is here for f/u......  .  History of Present Illness: Here for f/u on 07/27/2008 ER visit for bronchitis...given Doxy and tussionex. No further cough. No fevers. still occasionally feels SOB with her exercises([employer] aerobics for about 15 min).   Saw Dr.Hochrein,cards, last week.Had carotid dopplers done.  Meds reviewed(bottles) and pt had 2 bottles(cozaar and Losartin) plus she does not have her lasix. pt says has old bottle of lasix at home 20 mg and has been taking 2 two times a day.  Current Medications (verified): 1)  Levemir Flexpen 100 Unit/ml Soln (Insulin Detemir) .... 25  Units Subcutaneously Each Morning 2)  Plavix 75 Mg Tabs (Clopidogrel Bisulfate) .[name].. 1 By Mouth Qday 3)  Bayer Low Strength 81 Mg  Tbec (Aspirin) .[name].. 1 By Mouth Qday 4)  Advair Diskus 250-50 Mcg/dose Misc (Fluticasone-Salmeterol) .[name].. 1 Inh Bid 5)  Caltrate 600+d Plus 600-400 Mg-Unit  Chew (Calcium Carbonate-Vit D-Min) .[name].. 1 By Mouth Qday 6)  Coreg 12.5 Mg  Tabs (Carvedilol) .... Take 1 Tablet By Mouth Every 12 Hours(Per Dr.hochrein) 7)  Lasix 40 Mg Tabs (Furosemide) .... Two Times A Day 8)  Potassium Chloride Crys Cr 20 Meq  Tbcr (Potassium Chloride  Crys Cr) .... Take 2 Tablet Once By Mouth Daily 9)  Levemir Flexpen Needles .... Use For Daily Levemir Injection. 10)  Cozaar 50 Mg Tabs (Losartan Potassium) .... Two Times A Day 11)  Simvastatin 40 Mg Tabs (Simvastatin) .... Take One Tablet At Bedtime  Allergies (verified): 1)  ! * Avoid Nsaids  Past History:  Past Medical History: Reviewed history from 03/25/2008 and no changes required.  1. Coronary artery disease, status post CABG   2. Mild LV dysfunction (EF 40%-50%).   3. Hypertension.   4. Morbid obesity.   5. Diabetes mellitus.   6. Dyslipidemia.   7. Asthma.   8. Renal insufficiency.   9. Hyponatremia.   10.Diastolic heart failure.   Past Surgical History: Reviewed history from 03/25/2008 and no changes required. Coronary artery bypass graft; 03/18/2007 (Dr.Gerhardt LIMA to the LAD, SVG to       distal circumflex, and SVG to right coronary artery)  Physical Exam  General:  Well-developed,well-nourished,in no acute distress; alert,appropriate and cooperative throughout examination Lungs:  Normal  respiratory effort, chest expands symmetrically. Lungs are clear to auscultation, no crackles or wheezes. Heart:  Normal rate and regular rhythm. S1 and S2 normal without gallop, murmur, click, rub or other extra sounds. Extremities:  No CCE.  Diabetes Management Exam:    Foot Exam (with socks and/or shoes not present):       Sensory-Monofilament:          Left foot: normal          Right foot: normal   Impression & Recommendations:  Problem # 1:  DIABETES MELLITUS, TYPE II (ICD-250.00) HgA1c=6.2% Pt to see Eye MD,Dr.Gary Rankin, this Friday. Td and pneumovax UTD. Had recent labs done per Dr.Hochrein and present in EMR.  Her updated medication list for this problem includes:    Levemir Flexpen 100 Unit/ml Soln (Insulin detemir) .[name]... 25  units subcutaneously each morning    Bayer Low Strength 81 Mg Tbec (Aspirin) .[name]... 1 by mouth qday    Cozaar 50 Mg Tabs (Losartan  potassium) .[name]..[name] Two times a day  Problem # 2:  Bronchitis dx'd ER 07/2008 Seems to have resolved.  Complete Medication List: 1)  Levemir Flexpen 100 Unit/ml Soln (Insulin detemir) .... 25  units subcutaneously each morning 2)  Plavix 75 Mg Tabs (Clopidogrel bisulfate) .[name].. 1 by mouth qday 3)  Bayer Low Strength 81 Mg Tbec (Aspirin) .[name].. 1 by mouth qday 4)  Advair Diskus 250-50 Mcg/dose Misc (Fluticasone-salmeterol) .[name].. 1 inh bid 5)  Caltrate 600+d Plus 600-400 Mg-unit Chew (Calcium carbonate-vit d-min) .[name].. 1 by mouth qday 6)  Coreg 12.5 Mg Tabs (Carvedilol) .... Take 1 tablet by mouth every 12 hours(per dr.hochrein) 7)  Lasix 40 Mg Tabs (Furosemide) .... Two times a day 8)  Potassium Chloride Crys Cr 20 Meq Tbcr (Potassium chloride crys cr) .... Take 2 tablet once by mouth daily 9)  Levemir Flexpen Needles  .... Use for daily levemir injection. 10)  Cozaar 50 Mg Tabs (Losartan potassium) .... Two times a day 11)  Simvastatin 40 Mg Tabs (Simvastatin) .... Take one tablet at bedtime  Patient Instructions: 1)  Please schedule a follow-up appointment in 6 months or as needed. Prescriptions: LEVEMIR FLEXPEN 100 UNIT/ML SOLN (INSULIN DETEMIR) 25  units subcutaneously each morning  #1 month x 5   Entered and Authorized by:   Beverley Fiedler MD   Signed by:   Beverley Fiedler MD on 09/26/2008   Method used:   Electronically to        [facility]  [id number]* (retail)       [address]       [place], [place]  [address]       Ph: [phone] or [phone]       Fax: [phone]   RxID:   [id number]   Laboratory Results   Blood Tests   Date/Time Received: Sep 26, 2008 10:40 AM   HGBA1C: 6.2%   (Normal Range: Non-Diabetic - 3-6%   Control Diabetic - 6-8%) CBG Random:: 158mg /dL       Last LDL:                                                 90 (09/12/2008 9:17:57 AM)        Diabetic Foot Exam Foot Inspection Is there a  history of a  foot ulcer?              No Is there a foot ulcer now?              No Can the patient see the bottom of their feet?          Yes Are the shoes appropriate in style and fit?          Yes Is there swelling or an abnormal foot shape?          No Are the toenails long?                No Are the toenails thick?                Yes Are the toenails ingrown?              No Is there heavy callous build-up?              Yes Is there a claw toe deformity?                          No Is there elevated skin temperature?            No Is there limited ankle dorsiflexion?            No Is there foot or ankle muscle weakness?            No Do you have pain in calf while walking?           No         10-g (5.07) Semmes-Weinstein Monofilament Test Performed by: [place] Shannon          Right Foot          Left Foot Visual Inspection               Test Control      normal         normal Site 1         normal         normal Site 2         normal         normal Site 3         normal         normal Site 4         normal         normal Site 5         normal         normal Site 6         normal         normal Site 7         normal         normal Site 8         normal         normal Site 9         normal         normal Site 10         normal         normal  Impression      normal         normal

## 2010-06-03 NOTE — Letter (Signed)
Summary: [facility]ILED REQUESTED RECORDS TO [facility]  MAILED REQUESTED RECORDS TO [facility]   Imported By: Arta Bruce 09/16/2009 16:04:05  _____________________________________________________________________  External Attachment:    Type:   Image     Comment:   External Document

## 2010-06-03 NOTE — Progress Notes (Signed)
  Phone Note Outgoing Call   Summary of Call: Rec'd notification patient may not be filling simvastatin. Please find out if she is taking simvastatin. If not, is she taking something else for chol.? If not, why is she not taking anything? Initial call taken by: Brynda Rim,  July 26, 2009 4:48 PM  Follow-up for Phone Call        spoke with pt and she is no longer with [place] because of her insurance and she says she has another primary provider now Follow-up by: [place] Shannon,  July 29, 2009 3:39 PM  Additional Follow-up for Phone Call Additional follow up Details #1::        ok Additional Follow-up by: Tereso Newcomer PA-C,  July 29, 2009 5:18 PM

## 2010-06-03 NOTE — Assessment & Plan Note (Signed)
Summary: Diabetes/HTN   Vital Signs:  Patient profile:   77 year old female Menstrual status:  postmenopausal Weight:      230.0 pounds BMI:     43.62 Temp:     97.1 degrees F oral Pulse rate:   90 / minute Pulse rhythm:   regular Resp:     20 per minute BP sitting:   210 / 90  (left arm) Cuff size:   regular  Vitals Entered By: Levon Hedger (March 20, 2010 12:24 PM)  Nutrition Counseling: Patient's BMI is greater than 25 and therefore counseled on weight management options.  Serial Vital Signs/Assessments:  Time      Position  BP       Pulse  Resp  Temp     By                     200/94   88                    Arm[place] Shannon  CC: renew medication has been without medication over a week, Lipid Management, Hypertension Management Is Patient Diabetic? Yes Pain Assessment Patient in pain? no      CBG Result 184 CBG Device ID B  Does patient need assistance? Functional Status Self care Ambulation Normal     Menstrual Status postmenopausal   Primary Care Provider:  [facility]  CC:  renew medication has been without medication over a week, Lipid Management, and Hypertension Management.  History of Present Illness:  Pt into the office for f/u. Reports that she has changed providers since her last visit here due to insurance coverage. She has now decided to return to this office for primary care. Pt reports that her medications were changed in that office and she did NOT take them as ordered because she felt she was stable on her previous medicatons.  Pt did NOT take any medications today.    Hypertension History:      She complains of peripheral edema, but denies headache, chest pain, and palpitations.  pt did not take her blood pressure medications as ordered.        Positive major cardiovascular risk factors include female age 11 years old or older, diabetes, hyperlipidemia, and hypertension.  Negative major cardiovascular risk factors include  non-tobacco-user status.        Positive history for target organ damage include ASHD (either angina/prior MI/prior CABG) and cardiac end organ damage (either CHF or LVH).    Lipid Management History:      Positive NCEP/ATP III risk factors include female age 37 years old or older, diabetes, hypertension, and ASHD (either angina/prior MI/prior CABG).  Negative NCEP/ATP III risk factors include non-tobacco-user status.        Comments include: pt is not fasting today for labs and she has not taking cholesterol medications consecutively over the past 2 weeks.      Allergies (verified): 1)  ! * Avoid Nsaids  Review of Systems General:  Denies fever. CV:  Complains of shortness of breath with exertion and swelling of feet; denies chest pain or discomfort. Resp:  Denies cough. GI:  Denies abdominal pain, nausea, and vomiting. MS:  Complains of low back pain.  Physical Exam  General:  alert.   Head:  normocephalic.   Lungs:  normal breath sounds.   Heart:  normal rate and regular rhythm.   Extremities:  1+ left pedal edema and 1+ right pedal edema.  Neurologic:  alert & oriented X3.   Skin:  color normal.   Psych:  Oriented X3.    Diabetes Management Exam:    Foot Exam (with socks and/or shoes not present):       Sensory-Monofilament:          Left foot: normal          Right foot: normal   Impression & Recommendations:  Problem # 1:  HYPERTENSION, BENIGN ESSENTIAL (ICD-401.1) BP VERY elevated today advised pt to restart on medications clonidine 0.1mg  by mouth given in office today DASH diet reviewed Her updated medication list for this problem includes:    Lasix 40 Mg Tabs (Furosemide) .[name]... Take one tablet by mouth twice daily.    Cozaar 100 Mg Tabs (Losartan potassium) .[name]... Take 1 tablet by mouth once a day    Carvedilol 12.5 Mg Tabs (Carvedilol) .[name]... Take 1 tablet by mouth twice a day  Problem # 2:  DIABETES MELLITUS, TYPE II (ICD-250.00) pt is stable  continue  current medications Her updated medication list for this problem includes:    Levemir Flexpen 100 Unit/ml Soln (Insulin detemir) .[name]... 20  units subcutaneously each morning    Cozaar 100 Mg Tabs (Losartan potassium) .[name]... Take 1 tablet by mouth once a day  Orders: Capillary Blood Glucose/CBG (82948) Hemoglobin A1C (83036)  Problem # 3:  PURE HYPERCHOLESTEROLEMIA (ICD-272.0)  Her updated medication list for this problem includes:    Simvastatin 20 Mg Tabs (Simvastatin) ..... One tablet by mouth nightly for cholesterol  Problem # 4:  ANEMIA, NORMOCYTIC, CHRONIC (ICD-285.9)  Orders: T-CBC w/Diff ([address])  Problem # 5:  OBESITY NOS (ICD-278.00)  Orders: T-TSH ([address]-91478)  Problem # 6:  DEPENDENT EDEMA, LEGS, BILATERAL (ICD-782.3)  Her updated medication list for this problem includes:    Lasix 40 Mg Tabs (Furosemide) .[name]... Take one tablet by mouth twice daily. Pt reports that she is not able to get her medications today will complete forms so that pt can get medications [facility] so that medications will be delivered to pts house  Complete Medication List: 1)  Levemir Flexpen 100 Unit/ml Soln (Insulin detemir) .... 20  units subcutaneously each morning 2)  Advair Diskus 250-50 Mcg/dose Misc (Fluticasone-salmeterol) .... One inhalation twice daily 3)  Caltrate 600+d Plus 600-400 Mg-unit Chew (Calcium carbonate-vit d-min) .... Take 1 tablet by mouth two times a day 4)  Lasix 40 Mg Tabs (Furosemide) .... Take one tablet by mouth twice daily. 5)  Potassium Chloride Crys Cr 20 Meq Tbcr (Potassium chloride crys cr) .... One tablet by mouth two times a day 6)  Novofine 30g X 8 Mm Misc (Insulin pen needle) .... To use with levemir flexpen 7)  Cozaar 100 Mg Tabs (Losartan potassium) .... Take 1 tablet by mouth once a day 8)  Simvastatin 20 Mg Tabs (Simvastatin) .... One tablet by mouth nightly for cholesterol 9)  Carvedilol 12.5 Mg Tabs (Carvedilol) ....  Take 1 tablet by mouth twice a day 10)  Singulair 10 Mg Tabs (Montelukast sodium) .... One tablet by mouth nightly for allergies/asthma  Other Orders: T-Comprehensive Metabolic Panel ([address]-13086)  Hypertension Assessment/Plan:      The patient's hypertensive risk group is category C: Target organ damage and/or diabetes.  Today's blood pressure is 210/90.  Her blood pressure goal is < 130/80.  Lipid Assessment/Plan:      Based on NCEP/ATP III, the patient's risk factor category is "history of coronary disease, peripheral vascular disease, cerebrovascular disease,  or aortic aneurysm along with either diabetes, current smoker, or LDL > 130 plus HDL < 40 plus triglycerides > 200".  The patient's lipid goals are as follows: Total cholesterol goal is 200; LDL cholesterol goal is 70; HDL cholesterol goal is 40; Triglyceride goal is 150.    Patient Instructions: 1)  Restart on all your medications as previously prescribed in this office. 2)  I will contact [facility] so that they can deliver the medications to your home. 3)  They will contact you. 4)  Follow up in 2 weeks for triage visit - blood pressure check  5)  goal < 140/90 Prescriptions: POTASSIUM CHLORIDE CRYS CR 20 MEQ  TBCR (POTASSIUM CHLORIDE CRYS CR) One tablet by mouth two times a day  #60 x 5   Entered and Authorized by:   Lehman Prom FNP   Signed by:   Lehman Prom FNP on 03/20/2010   Method used:   Print then Give to Patient   RxID:   [id number] SIMVASTATIN 20 MG TABS (SIMVASTATIN) One tablet by mouth nightly for cholesterol  #30 x 5   Entered and Authorized by:   Lehman Prom FNP   Signed by:   Lehman Prom FNP on 03/20/2010   Method used:   Print then Give to Patient   RxID:   [id number] CARVEDILOL 12.5 MG TABS (CARVEDILOL) Take 1 tablet by mouth twice a day  #60 Tablet x 5   Entered and Authorized by:   Lehman Prom FNP   Signed by:   Lehman Prom FNP on 03/20/2010   Method  used:   Print then Give to Patient   RxID:   [id number] SINGULAIR 10 MG TABS (MONTELUKAST SODIUM) One tablet by mouth nightly for allergies/asthma  #30 Tablet x 5   Entered and Authorized by:   Lehman Prom FNP   Signed by:   Lehman Prom FNP on 03/20/2010   Method used:   Print then Give to Patient   RxID:   [id number] COZAAR 100 MG TABS (LOSARTAN POTASSIUM) Take 1 tablet by mouth once a day  #30 x 5   Entered and Authorized by:   Lehman Prom FNP   Signed by:   Lehman Prom FNP on 03/20/2010   Method used:   Print then Give to Patient   RxID:   [id number] LASIX 40 MG TABS (FUROSEMIDE) Take one tablet by mouth twice daily.  #60 x 5   Entered and Authorized by:   Lehman Prom FNP   Signed by:   Lehman Prom FNP on 03/20/2010   Method used:   Print then Give to Patient   RxID:   [id number] ADVAIR DISKUS 250-50 MCG/DOSE MISC (FLUTICASONE-SALMETEROL) One inhalation twice daily  #1 x 5   Entered and Authorized by:   Lehman Prom FNP   Signed by:   Lehman Prom FNP on 03/20/2010   Method used:   Print then Give to Patient   RxID:   [id number] LEVEMIR FLEXPEN 100 UNIT/ML SOLN (INSULIN DETEMIR) 20  units subcutaneously each morning  #1 month qs x 5   Entered and Authorized by:   Lehman Prom FNP   Signed by:   Lehman Prom FNP on 03/20/2010   Method used:   Print then Give to Patient   RxID:   [id number]   Diabetic Foot Exam Last Podiatry Exam Date: 02/28/2009 Foot Inspection Is there a history of a foot ulcer?  No Is there a foot ulcer now?              No Is there swelling or an abnormal foot shape?          No Are the toenails long?                Yes Are the toenails thick?                Yes Are the toenails ingrown?              No Is there heavy callous build-up?              No Is there pain in the calf muscle (Intermittent claudication) when walking?    NoIs there a claw toe deformity?               No Is there elevated skin temperature?            No Is there limited ankle dorsiflexion?            No Is there foot or ankle muscle weakness?            No  Diabetic Foot Care Education Patient educated on appropriate care of diabetic feet.  Pulse Check          Right Foot          Left Foot Dorsalis Pedis:        normal            normal    10-g (5.07) Semmes-Weinstein Monofilament Test Performed by: Levon Hedger          Right Foot          Left Foot Visual Inspection                 Orders Added: 1)  Capillary Blood Glucose/CBG [82948] 2)  Est. Patient Level IV [21308] 3)  Hemoglobin A1C [83036] 4)  T-Comprehensive Metabolic Panel [80053-22900] 5)  T-CBC w/Diff [65784-69629] 6)  T-TSH [52841-32440]     Last LDL:                                                 90 (09/12/2008 9:17:57 AM)        Diabetic Foot Exam Last Podiatry Exam Date: 02/28/2009 Diabetic Foot Care Education :Patient educated on appropriate care of diabetic feet.  Pulse Check          Right Foot          Left Foot Dorsalis Pedis:        normal            normal    10-g (5.07) Semmes-Weinstein Monofilament Test Performed by: Levon Hedger          Right Foot          Left Foot Visual Inspection               Test Control      normal         normal Site 1         abnormal         normal Site 2         normal         normal Site 3  normal         normal Site 4         abnormal         normal Site 5         normal         normal Site 6         normal         normal Site 7         abnormal         normal Site 8         normal         normal Site 9         abnormal         normal Site 10         normal         normal  Impression      normal         normal   Laboratory Results   Blood Tests   Date/Time Received: March 20, 2010 1:11 PM   HGBA1C: 6.1%   (Normal Range: Non-Diabetic - 3-6%   Control Diabetic - 6-8%) CBG Random:: 184mg /dL

## 2010-06-03 NOTE — Medication Information (Signed)
Summary: [facility]/dr.'s order //faxed  [facility]/dr[facility]s order //faxed   Imported By: Arta Bruce 07/11/2009 10:31:57  _____________________________________________________________________  External Attachment:    Type:   Image     Comment:   External Document

## 2010-06-03 NOTE — Letter (Signed)
Summary: DEXA SCAN REPORT  DEXA SCAN REPORT   Imported By: Arta Bruce 05/28/2009 10:02:54  _____________________________________________________________________  External Attachment:    Type:   Image     Comment:   External Document

## 2010-06-03 NOTE — Letter (Signed)
Summary: colonoscopy report  colonoscopy report   Imported By: Arta Bruce 06/20/2009 11:55:18  _____________________________________________________________________  External Attachment:    Type:   Image     Comment:   External Document

## 2010-06-03 NOTE — Letter (Signed)
Summary: STATIN USE IN DIABETIC PT  STATIN USE IN DIABETIC PT   Imported By: Arta Bruce 10/07/2009 10:58:52  _____________________________________________________________________  External Attachment:    Type:   Image     Comment:   External Document

## 2010-06-03 NOTE — Letter (Signed)
Summary: *HSN Results Follow up  [facility]  [address] [place], [place] [address]   Phone: [phone]  Fax: [phone]      05/30/2009   BEVIN DAS [address] [place], [place]  [address]   Dear  [name],                            ____S.Drinkard,FNP   ____D. Gore,FNP       ____B. McPherson,MD   ____V. Rankins,MD    ____E. Mulberry,MD    ____N. Daphine Deutscher, FNP  ____D. Reche Dixon, MD    ____K. Philipp Deputy, MD    ____Other     This letter is to inform you that your recent test(s):  _______Pap Smear    _______Lab Test     _______X-ray    _______ is within acceptable limits  ___X____ requires a medication change  _______ requires a follow-up lab visit  _______ requires a follow-up visit with your provider   Comments:  We have been trying to reach you.  Please give the office a call at your earliest convenience.       _________________________________________________________ If you have any questions, please contact our office                     Sincerely,  [place] Shannon [facility]

## 2010-06-03 NOTE — Assessment & Plan Note (Signed)
Summary: BLOOD PRESSURE//MC  Nurse Visit   Vital Signs:  Patient profile:   77 year old female Menstrual status:  postmenopausal Pulse rate:   76 / minute Pulse rhythm:   regular Resp:     20 per minute BP sitting:   164 / 72  (right arm) Cuff size:   regular  Vitals Entered By: Dutch Quint RN (April 03, 2010 9:10 AM)  Impression & Recommendations:  Problem # 1:  HYPERTENSION, BENIGN ESSENTIAL (ICD-401.1)  BP still elevated Not taking meds prescribed by this office. Taking amlodipine 5 mg. daily prescribed by cardiologist To start taking Carvedilol Hold Cozaar for now Return in two weeks for BP recheck with triage nurse -- bring all med bottles  Her updated medication list for this problem includes:    Lasix 40 Mg Tabs (Furosemide) .[name]... Take one tablet by mouth twice daily.    Cozaar 100 Mg Tabs (Losartan potassium) .[name]... Take 1 tablet by mouth once a day    Carvedilol 12.5 Mg Tabs (Carvedilol) .[name]... Take 1 tablet by mouth twice a day  Orders: Est. Patient Level I (28315)   Patient Instructions: 1)  Reviewed with Jesse Fall 2)  Your blood pressure is still elevated 3)  Continue taking the amlodipine 5 mg. as you have been doing. 4)  Start taking the Carvedilol 12.5 mg. by mouth twice a day. 5)  Return in two weeks for a blood pressure check with the triage nurse -- bring all of your medication bottles with you.  Make sure you take your medications at least 2 hours before you come. 6)  Call if you have any questions or if anything changes.   Primary Care Provider:  [facility]  CC:  BP recheck.  History of Present Illness: 03/20/10 Had been off meds -- to restart meds as ordered from this office.  BP 210/90.  States she started having some shakiness about a week now.  States she has not been taking Advair, Caltrate, potassium, singulair, carvedilol or losartan.  States she is only taking amlodipine 5 mg.(confirmed with pharmacy) daily that was ordered by  cardiologist.  Rochele Pages that med about 7 am.     Review of Systems CV:  Complains of shortness of breath with exertion and swelling of feet; SOB is per normal, no change.  Has pain in back of legs on palpation only.  +2 pitting edema to RLE, trace pitting to LLE.  States is having "chilly" feeling.   Physical Exam  General:  normal appearance.     CC: BP recheck Is Patient Diabetic? Yes Did you bring your meter with you today? No Pain Assessment Patient in pain? no     Location: lower back Onset of pain  Intermittent  Does patient need assistance? Functional Status Self care Ambulation Impaired:Risk for fall Comments Uses cane to ambulate   Allergies: 1)  ! * Avoid Nsaids  Orders Added: 1)  Est. Patient Level I [17616]

## 2010-06-03 NOTE — Assessment & Plan Note (Signed)
Summary: FLU SHOT//[facility]  Nurse Visit   Allergies: 1)  ! * Avoid Nsaids  Immunizations Administered:  Influenza Vaccine # 1:    Vaccine Type: Fluvax 3+    Site: right deltoid    Mfr: Gl[employer]oSmith[employer]    Dose: 0.5 ml    Route: IM    Given by: Gaylyn Cheers RN    Exp. Date: 11/01/2010    Lot #: [id number]    VIS given: 11/26/09 version given March 12, 2010.  Flu Vaccine Consent Questions:    Do you have a history of severe allergic reactions to this vaccine? no    Any prior history of allergic reactions to egg and/or gelatin? no    Do you have a sensitivity to the preservative Thimersol? no    Do you have a past history of Guillan-Barre Syndrome? no    Do you currently have an acute febrile illness? no    Have you ever had a severe reaction to latex? no    Vaccine information given and explained to patient? yes    Are you currently pregnant? no  Orders Added: 1)  Flu Vaccine 79yrs + [90658] 2)  Admin 1st Vaccine [30865]

## 2010-06-03 NOTE — Assessment & Plan Note (Signed)
Summary: 4 month rov/sl   Visit Type:  Follow-up Primary Provider:  [facility]  CC:  SOB.  History of Present Illness: 77 y/o woman with multiple medical problems including CAD s/p stenting x 2 and then CABG 2008 by Dr. Tyrone Sage, diastolic HF (EF 60% by ECHO 5/10), obesity, HTN, HL and DM2.  We saw her severeal months ago and lasix was increased for volume overload.  Returns for routine f/u. Doing OK. Tries to walk for 30 minutes over the day. Does get dyspneic with just mild activity. No change. Edema much better. Weight down 8 pounds. No orthopnea or PND. Compliant with all medications. (Didn't take lasix this am due to appt). No CP. SBP typically in 135-140 range.   Has scale at home but doesn't weigh herself every day b/c she thinks it is not healthy.   Current Medications (verified): 1)  Levemir Flexpen 100 Unit/ml Soln (Insulin Detemir) .... 20  Units Subcutaneously Each Morning 2)  Advair Diskus 250-50 Mcg/dose Misc (Fluticasone-Salmeterol) .... One Inhalation Twice Daily 3)  Caltrate 600+d Plus 600-400 Mg-Unit  Chew (Calcium Carbonate-Vit D-Min) .... Take 1 Tablet By Mouth Two Times A Day 4)  Lasix 40 Mg Tabs (Furosemide) .... Take One Tablet By Mouth Twice Daily. 5)  Potassium Chloride Crys Cr 20 Meq  Tbcr (Potassium Chloride Crys Cr) .... Take 2 Tablet Once By Mouth Daily 6)  Novofine 30g X 8 Mm Misc (Insulin Pen Needle) .... To Use With Levemir Flexpen 7)  Cozaar 100 Mg Tabs (Losartan Potassium) .... Take 1 Tablet By Mouth Once A Day 8)  Simvastatin 40 Mg Tabs (Simvastatin) .... Take One Tablet At Bedtime 9)  Carvedilol 12.5 Mg Tabs (Carvedilol) .... Take 1 Tablet By Mouth Twice A Day 10)  Singulair 10 Mg Tabs (Montelukast Sodium) .... One Tablet By Mouth Nightly For Allergies/asthma  Allergies (verified): 1)  ! * Avoid Nsaids  Past History:  Past Medical History: Last updated: 05/15/2009  1. Coronary artery disease, status post CABG   2. h/o mild LV dysfunction (EF  40%-50%)      --ECHO 5/10: EF 60%  RSVP  ~50 + diastolic dysfunctio  3. Hypertension.   4. Morbid obesity.   5. Diabetes mellitus.   6. Dyslipidemia.   7. Asthma.   8. Renal insufficiency.   9. Hyponatremia.   10.Diastolic heart failure.  11. Osteoporosis (DEXA 04/2009 Tscore -3.4)  Review of Systems       As per HPI and past medical history; otherwise all systems negative.   Vital Signs:  Patient profile:   77 year old female Height:      61 inches Weight:      221 pounds BMI:     41.91 Pulse rate:   85 / minute BP sitting:   156 / 72  (left arm) Cuff size:   regular  Vitals Entered By: Hardin Negus, RMA (August 15, 2009 2:28 PM)  Physical Exam  General:   Gen: well appearing. no resp difficulty HEENT: normal Neck: supple. JVP 6. Carotids 2+ bilat; no bruits. No lymphadenopathy or thryomegaly appreciated. Cor: PMI nondisplaced. Regular rate & rhythm. No rubs, gallops. +S4. Lungs: clear Abdomen: obese soft, nontender, nondistended. Good bowel sounds. Extremities: no cyanosis, clubbing, rash, 1+ edema Neuro: alert & orientedx3, cranial nerves grossly intact. moves all 4 extremities w/o difficulty. affect pleasant     Impression & Recommendations:  Problem # 1:  DIASTOLIC HEART FAILURE, CHRONIC (ICD-428.32) Doing fairly well. Only mild volume overload. We had  long talk about daily weights and how to use sliding scale lasix. (If weight up 3 or more pounds take 1 1/2 lasix tablets in am and 1 at night).  Problem # 2:  HYPERTENSION (ICD-401.9) Mildly elevated. Followed by PCP.  Conside addin amlodipine as needed.  Problem # 3:  CORONARY ARTERY DISEASE (ICD-414.00) Stable. No evidence of ischemia. Continue current regimen.  Patient Instructions: 1)  Follow up in 9 months

## 2010-06-03 NOTE — Miscellaneous (Signed)
Summary: DEXA:  Osteoporosis  Patient's bone density shows that she has osteoporosis.  I need to start her on Fosamax 70 mg every week and she needs to increase her caltrate to two times a day. What pharmacy??   [facility] on [address]....... spoke with pt and she is aware of bone density results..... [name]  May 16, 2009 4:56 PM   Clinical Lists Changes  Problems: Added new problem of OSTEOPOROSIS (ICD-733.00) - Signed Assessed OSTEOPOROSIS as comment only -  DEXA scan + for osteoporosis will contact patient to start  Fosamax 70 mg q week and increase Caltrat + D to two times a day  Her updated medication list for this problem includes:    Caltrate 600+d Plus 600-400 Mg-unit Chew (Calcium carbonate-vit d-min) .[name]... 1 by mouth qday  - Signed Observations: Added new observation of PAST MED HX:  1. Coronary artery disease, status post CABG   2. h/o mild LV dysfunction (EF 40%-50%)      --ECHO 5/10: EF 60%  RSVP  ~50 + diastolic dysfunctio  3. Hypertension.   4. Morbid obesity.   5. Diabetes mellitus.   6. Dyslipidemia.   7. Asthma.   8. Renal insufficiency.   9. Hyponatremia.   10.Diastolic heart failure.  11. Osteoporosis (DEXA 04/2009 Tscore -3.4) (05/15/2009 21:17) Added new observation of BONE DENSITY: Right Femur Neck with a T score of -3.4 (04/19/2009 21:20)      Bone Density  Procedure date:  04/19/2009  Findings:      Right Femur Neck with a T score of -3.4  Comments:       Assessment:  Osteoporosis.        Past History:  Past Medical History:  1. Coronary artery disease, status post CABG   2. h/o mild LV dysfunction (EF 40%-50%)      --ECHO 5/10: EF 60%  RSVP  ~50 + diastolic dysfunctio  3. Hypertension.   4. Morbid obesity.   5. Diabetes mellitus.   6. Dyslipidemia.   7. Asthma.   8. Renal insufficiency.   9. Hyponatremia.   10.Diastolic heart failure.  11. Osteoporosis (DEXA 04/2009 Tscore -3.4)   Impression &  Recommendations:  Problem # 1:  OSTEOPOROSIS (ICD-733.00) Assessment Comment Only  DEXA scan + for osteoporosis will contact patient to start  Fosamax 70 mg q week and increase Caltrat + D to two times a day  Her updated medication list for this problem includes:    Caltrate 600+d Plus 600-400 Mg-unit Chew (Calcium carbonate-vit d-min) .[name]... 1 by mouth qday  Complete Medication List: 1)  Levemir Flexpen 100 Unit/ml Soln (Insulin detemir) .... 20  units subcutaneously each morning 2)  Plavix 75 Mg Tabs (Clopidogrel bisulfate) .[name].. 1 by mouth qday 3)  Advair Diskus 250-50 Mcg/dose Misc (Fluticasone-salmeterol) .... One inhalation twice daily 4)  Caltrate 600+d Plus 600-400 Mg-unit Chew (Calcium carbonate-vit d-min) .[name].. 1 by mouth qday 5)  Lasix 40 Mg Tabs (Furosemide) .... Take one tablet by mouth twice daily. 6)  Potassium Chloride Crys Cr 20 Meq Tbcr (Potassium chloride crys cr) .... Take 2 tablet once by mouth daily 7)  Novofine 30g X 8 Mm Misc (Insulin pen needle) .... To use with levemir flexpen 8)  Cozaar 100 Mg Tabs (Losartan potassium) .... Take 1 tablet by mouth once a day 9)  Simvastatin 40 Mg Tabs (Simvastatin) .... Take one tablet at bedtime 10)  Carvedilol 12.5 Mg Tabs (Carvedilol) .... Take 1 tablet by mouth twice a day  11)  Singulair 10 Mg Tabs (Montelukast sodium) .... One tablet by mouth nightly for allergies/asthma  Appended Document: Orders Update I have reviewed her findings with Dr. Reche Dixon. In light of her kidney disease, heart disease and other health problems, I have decided to hold off on starting her on Fosamax.  In review of her kidney function, it is too low for her to take the Fosamax.  I want her to take the Caltrate two times a day like I suggested.  This is all she needs to do for now.  She does not need the Fosamax.    Left message on answering machine for pt to call back...Marland KitchenMarland KitchenArmenia Richardson  May 27, 2009 12:34 PM  Left message on answering machine  for pt to call back...Marland KitchenMarland KitchenArmenia Richardson  May 29, 2009 10:20 AM   Left message on answering machine for pt to call back...Marland KitchenMarland KitchenMarland Kitchen will mail letter. [name]  May 30, 2009 12:35 PM     Clinical Lists Changes  Problems: Assessed OSTEOPOROSIS as comment only - reviewed with Dr. Reche Dixon GFR too low to take Fosamax with comorbidities, rec. taking Ca+Vit D only increase caltrate to two times a day   Her updated medication list for this problem includes:    Caltrate 600+d Plus 600-400 Mg-unit Chew (Calcium carbonate-vit d-min) .[name]... 1 by mouth qday  - Signed Medications: Changed medication from CALTRATE 600+D PLUS 600-400 MG-UNIT  CHEW (CALCIUM CARBONATE-VIT D-MIN) 1 by mouth qday to CALTRATE 600+D PLUS 600-400 MG-UNIT  CHEW (CALCIUM CARBONATE-VIT D-MIN) Take 1 tablet by mouth two times a day - Signed        Impression & Recommendations:  Problem # 1:  OSTEOPOROSIS (ICD-733.00) reviewed with Dr. Reche Dixon GFR too low to take Fosamax with comorbidities, rec. taking Ca+Vit D only increase caltrate to two times a day   Her updated medication list for this problem includes:    Caltrate 600+d Plus 600-400 Mg-unit Chew (Calcium carbonate-vit d-min) .[name]... Take 1 tablet by mouth two times a day  Complete Medication List: 1)  Levemir Flexpen 100 Unit/ml Soln (Insulin detemir) .... 20  units subcutaneously each morning 2)  Plavix 75 Mg Tabs (Clopidogrel bisulfate) .[name].. 1 by mouth qday 3)  Advair Diskus 250-50 Mcg/dose Misc (Fluticasone-salmeterol) .... One inhalation twice daily 4)  Caltrate 600+d Plus 600-400 Mg-unit Chew (Calcium carbonate-vit d-min) .... Take 1 tablet by mouth two times a day 5)  Lasix 40 Mg Tabs (Furosemide) .... Take one tablet by mouth twice daily. 6)  Potassium Chloride Crys Cr 20 Meq Tbcr (Potassium chloride crys cr) .... Take 2 tablet once by mouth daily 7)  Novofine 30g X 8 Mm Misc (Insulin pen needle) .... To use with levemir flexpen 8)  Cozaar 100 Mg Tabs  (Losartan potassium) .... Take 1 tablet by mouth once a day 9)  Simvastatin 40 Mg Tabs (Simvastatin) .... Take one tablet at bedtime 10)  Carvedilol 12.5 Mg Tabs (Carvedilol) .... Take 1 tablet by mouth twice a day 11)  Singulair 10 Mg Tabs (Montelukast sodium) .... One tablet by mouth nightly for allergies/asthma

## 2010-06-05 NOTE — Assessment & Plan Note (Signed)
Summary: BLOOD PRESSURE//[facility]  Nurse Visit   Vital Signs:  Patient profile:   77 year old female Menstrual status:  postmenopausal Weight:      233.8 pounds Pulse rate:   68 / minute Pulse rhythm:   regular Resp:     20 per minute BP sitting:   140 / 62  (right arm) Cuff size:   regular  Vitals Entered By: Dutch Quint RN (April 18, 2010 9:18 AM)  Patient Instructions: 1)  Your blood pressure has greatly improved! 2)  Keep doing what you're doing, continue taking your medications daily. 3)  Stop Crestor, continue taking simvastatin 4)  Make appointment for three months with provider for follow-up with your blood pressure. 5)  Call if anything changes or if you have any questions.   Impression & Recommendations:  Problem # 1:  HYPERTENSION, BENIGN ESSENTIAL (ICD-401.1)  BP greatly improved Continue meds F/U with provider in 3 months  Her updated medication list for this problem includes:    Lasix 40 Mg Tabs (Furosemide) .[name]... Take one tablet by mouth twice daily.    Cozaar 100 Mg Tabs (Losartan potassium) .[name]... Take 1 tablet by mouth once a day    Carvedilol 12.5 Mg Tabs (Carvedilol) .[name]... Take 1 tablet by mouth twice a day  Orders: Est. Patient Level I (16109)  Problem # 2:  PURE HYPERCHOLESTEROLEMIA (ICD-272.0) Has been taking Crestor and simvastatin Instructed not to take Crestor, just simvastatin  Her updated medication list for this problem includes:    Simvastatin 20 Mg Tabs (Simvastatin) ..... One tablet by mouth nightly for cholesterol   Serial Vital Signs/Assessments:  Time      Position  BP       Pulse  Resp  Temp     By 9:39 AM   R Arm     110/72                         Dutch Quint RN 9:39 AM   L Arm     118/56                         Dutch Quint RN  CC: BP recheck Is Patient Diabetic? Yes Did you bring your meter with you today? No Pain Assessment Patient in pain? yes     Location: back of left leg Intensity: 6-7 Type: aching, sometimes  sharp Onset of pain  just came on today  Does patient need assistance? Functional Status Self care Ambulation Impaired:Risk for fall Comments Uses cane for ambulation   Primary Provider:  [facility]  CC:  BP recheck.  History of Present Illness: 04/03/10 - BP 164/72.  Was only taking amlodipine Rx'd by cardiologist.  Was told to hold Cozaar and start carvedilol which she had not been taking.  Took all her BP meds this morning, did not take lasix.  Started carvedilol last Thursday, has been taking daily since.  Confirmed meds against medication list -- has been taking Crestor and simvastatin both.  Asymptomatic.     Physical Exam  General:  normal appearance. Extremities:  BLE grossly edematous -- 2+ left pedal edema and 2+ right pedal edema.     Review of Systems Eyes:  Complains of peripheral edema; Denies SOB except with exertion, but states told that she's "breathing hard" denies pain with respirations.  Back of legs only hurt with palpation.  Both legs are swollen, states edema varies.  Denies headache, visual changes.  .   Allergies: 1)  ! * Avoid Nsaids  Orders Added: 1)  Est. Patient Level I [04540]

## 2010-06-05 NOTE — Medication Information (Signed)
Summary: RX Folder/DIABETES TESTING SUPPLIES  RX Folder/DIABETES TESTING SUPPLIES   Imported By: Arta Bruce 05/20/2010 14:51:24  _____________________________________________________________________  External Attachment:    Type:   Image     Comment:   External Document

## 2010-06-06 NOTE — Letter (Signed)
Summary: TEST ORDER FORM//DEXA SCAN//APPT DATE & TIME  TEST ORDER FORM//DEXA SCAN//APPT DATE & TIME   Imported By: Arta Bruce 05/23/2009 15:14:28  _____________________________________________________________________  External Attachment:    Type:   Image     Comment:   External Document

## 2010-06-06 NOTE — Medication Information (Signed)
Summary: [name] //[employer]  DOCTOR ORDERS //[employer]   Imported By: Arta Bruce 07/09/2009 12:47:19  _____________________________________________________________________  External Attachment:    Type:   Image     Comment:   External Document

## 2010-06-06 NOTE — Letter (Signed)
Summary: REFERRAL/PHYSICAL THERAPY  REFERRAL/PHYSICAL THERAPY   Imported By: Arta Bruce 06/11/2009 15:07:38  _____________________________________________________________________  External Attachment:    Type:   Image     Comment:   External Document

## 2010-08-11 LAB — BASIC METABOLIC PANEL
BUN: 28 mg/dL — ABNORMAL HIGH (ref 6–23)
CO2: 29 mEq/L (ref 19–32)
CO2: 29 mEq/L (ref 19–32)
Calcium: 8.6 mg/dL (ref 8.4–10.5)
Chloride: 102 mEq/L (ref 96–112)
Chloride: 107 mEq/L (ref 96–112)
Creatinine, Ser: 1.3 mg/dL — ABNORMAL HIGH (ref 0.4–1.2)
Creatinine, Ser: 1.37 mg/dL — ABNORMAL HIGH (ref 0.4–1.2)
GFR calc Af Amer: 45 mL/min — ABNORMAL LOW (ref >60)
Glucose, Bld: 127 mg/dL — ABNORMAL HIGH (ref 70–99)
Glucose, Bld: 94 mg/dL (ref 70–99)
Sodium: 138 mEq/L (ref 135–145)

## 2010-08-11 LAB — DIFFERENTIAL
Basophils Absolute: 0 10*3/uL (ref 0.0–0.1)
Basophils Absolute: 0.1 10*3/uL (ref 0.0–0.1)
Basophils Relative: 0 % (ref 0–1)
Basophils Relative: 0 % (ref 0–1)
Eosinophils Absolute: 0.1 10*3/uL (ref 0.0–0.7)
Eosinophils Relative: 3 % (ref 0–5)
Lymphocytes Relative: 28 % (ref 12–46)
Lymphocytes Relative: 29 % (ref 12–46)
Lymphs Abs: 1.4 10*3/uL (ref 0.7–4.0)
Monocytes Absolute: 0.4 10*3/uL (ref 0.1–1.0)
Monocytes Absolute: 0.5 10*3/uL (ref 0.1–1.0)
Monocytes Relative: 11 % (ref 3–12)
Neutro Abs: 2.8 10*3/uL (ref 1.7–7.7)
Neutrophils Relative %: 58 % (ref 43–77)
Neutrophils Relative %: 59 % (ref 43–77)

## 2010-08-11 LAB — GLUCOSE, CAPILLARY
Glucose-Capillary: 100 mg/dL — ABNORMAL HIGH (ref 70–99)
Glucose-Capillary: 101 mg/dL — ABNORMAL HIGH (ref 70–99)
Glucose-Capillary: 104 mg/dL — ABNORMAL HIGH (ref 70–99)
Glucose-Capillary: 107 mg/dL — ABNORMAL HIGH (ref 70–99)
Glucose-Capillary: 107 mg/dL — ABNORMAL HIGH (ref 70–99)
Glucose-Capillary: 110 mg/dL — ABNORMAL HIGH (ref 70–99)
Glucose-Capillary: 134 mg/dL — ABNORMAL HIGH (ref 70–99)
Glucose-Capillary: 139 mg/dL — ABNORMAL HIGH (ref 70–99)
Glucose-Capillary: 95 mg/dL (ref 70–99)
Glucose-Capillary: 99 mg/dL (ref 70–99)

## 2010-08-11 LAB — COMPREHENSIVE METABOLIC PANEL
ALT: 14 U/L (ref 0–35)
AST: 20 U/L (ref 0–37)
Albumin: 3 g/dL — ABNORMAL LOW (ref 3.5–5.2)
Alkaline Phosphatase: 72 U/L (ref 39–117)
Alkaline Phosphatase: 86 U/L (ref 39–117)
BUN: 21 mg/dL (ref 6–23)
BUN: 28 mg/dL — ABNORMAL HIGH (ref 6–23)
CO2: 28 mEq/L (ref 19–32)
CO2: 29 mEq/L (ref 19–32)
Calcium: 8.6 mg/dL (ref 8.4–10.5)
Calcium: 8.8 mg/dL (ref 8.4–10.5)
Chloride: 104 mEq/L (ref 96–112)
Chloride: 106 mEq/L (ref 96–112)
Creatinine, Ser: 1.23 mg/dL — ABNORMAL HIGH (ref 0.4–1.2)
Creatinine, Ser: 1.25 mg/dL — ABNORMAL HIGH (ref 0.4–1.2)
GFR calc Af Amer: 51 mL/min — ABNORMAL LOW (ref >60)
GFR calc non Af Amer: 39 mL/min — ABNORMAL LOW (ref >60)
GFR calc non Af Amer: 42 mL/min — ABNORMAL LOW (ref >60)
Glucose, Bld: 114 mg/dL — ABNORMAL HIGH (ref 70–99)
Glucose, Bld: 481 mg/dL — ABNORMAL HIGH (ref 70–99)
Potassium: 4.5 mEq/L (ref 3.5–5.1)
Potassium: 5.1 mEq/L (ref 3.5–5.1)
Sodium: 138 mEq/L (ref 135–145)
Total Bilirubin: 0.5 mg/dL (ref 0.3–1.2)
Total Bilirubin: 0.8 mg/dL (ref 0.3–1.2)
Total Protein: 6.7 g/dL (ref 6.0–8.3)

## 2010-08-11 LAB — CBC
HCT: 24.8 % — ABNORMAL LOW (ref 36.0–46.0)
HCT: 33.2 % — ABNORMAL LOW (ref 36.0–46.0)
Hemoglobin: 10.5 g/dL — ABNORMAL LOW (ref 12.0–15.0)
Hemoglobin: 11.1 g/dL — ABNORMAL LOW (ref 12.0–15.0)
MCHC: 33.6 g/dL (ref 30.0–36.0)
MCHC: 34.1 g/dL (ref 30.0–36.0)
MCHC: 34.2 g/dL (ref 30.0–36.0)
MCV: 94 fL (ref 78.0–100.0)
MCV: 96.1 fL (ref 78.0–100.0)
RBC: 2.58 MIL/uL — ABNORMAL LOW (ref 3.87–5.11)
RBC: 2.66 MIL/uL — ABNORMAL LOW (ref 3.87–5.11)
RBC: 3.18 MIL/uL — ABNORMAL LOW (ref 3.87–5.11)
RDW: 12.8 % (ref 11.5–15.5)
RDW: 14.1 % (ref 11.5–15.5)
WBC: 3.6 10*3/uL — ABNORMAL LOW (ref 4.0–10.5)

## 2010-08-11 LAB — TYPE AND SCREEN: ABO/RH(D): A POS

## 2010-08-11 LAB — MAGNESIUM: Magnesium: 2.2 mg/dL (ref 1.5–2.5)

## 2010-08-11 LAB — TSH: TSH: 1.529 u[IU]/mL (ref 0.350–4.500)

## 2010-08-11 LAB — HEMOGLOBIN AND HEMATOCRIT, BLOOD
HCT: 27.4 % — ABNORMAL LOW (ref 36.0–46.0)
HCT: 27.6 % — ABNORMAL LOW (ref 36.0–46.0)
HCT: 28.8 % — ABNORMAL LOW (ref 36.0–46.0)
HCT: 30.8 % — ABNORMAL LOW (ref 36.0–46.0)
Hemoglobin: 10.1 g/dL — ABNORMAL LOW (ref 12.0–15.0)
Hemoglobin: 9.7 g/dL — ABNORMAL LOW (ref 12.0–15.0)

## 2010-08-11 LAB — PREPARE RBC (CROSSMATCH)

## 2010-08-11 LAB — POCT CARDIAC MARKERS: CKMB, poc: 2.2 ng/mL (ref 1.0–8.0)

## 2010-08-11 LAB — PROTIME-INR
INR: 1.1 (ref 0.00–1.49)
INR: 1.1 (ref 0.00–1.49)
INR: 1.2 (ref 0.00–1.49)
Prothrombin Time: 15 seconds (ref 11.6–15.2)

## 2010-08-11 LAB — HEMOGLOBIN A1C: Mean Plasma Glucose: 114 mg/dL

## 2010-08-14 LAB — GLUCOSE, CAPILLARY

## 2010-09-16 NOTE — Assessment & Plan Note (Signed)
[facility]                            CARDIOLOGY OFFICE NOTE   Kristina, Richardson                     MRN:          [id number]  DATE:01/10/2007                            DOB:          1928/04/22    PRIMARY:  Dr. Beverley Fiedler.   REASON FOR PRESENTATION:  Evaluate patient, status post recent  angioplasty and stenting.   HISTORY OF PRESENT ILLNESS:  The patient presents for evaluation of her  known coronary disease.  She had a stress perfusion study after  describing increasing shortness of breath and some chest pressure with  exertion.  It showed inferolateral wall scar with moderate peri-infarct  ischemia and her ejection fraction was slightly low at 45%.  The  catheterization demonstrated the left main had 30% stenosis followed by  distal 50% stenosis; the LAD had ostial 30% stenosis followed by  proximal long 30% stenosis; there was distal 50% stenosis; the first  diagonal had 40% stenosis; the circumflex had an ostial long 99%  stenosis in a previously stented area; there was 30% after the stent.  There were mid stents traveling into the marginal.  There was a diffuse  in-stent restenosis there.  The right coronary artery had 25% and 30%  lesions.  A PDA had a long 60% stenosis.  EF was approximately 50% with  moderate lateral wall akinesis.  She subsequently had PCI of the in-  stent restenosis using a new Promus stent cutting balloon angioplasty  and PCI of the in-stent restenotic lesion in the mid circumflex using  cutting balloon.   She came back to the office for a nurse visit and was slightly  lightheaded.   She has felt well since then.  She has had some lightheadedness upon  standing, but no presyncope or syncope.  She has had none of the chest  pressure that she had prior to her stress test and is starting to walk  again.  She denies any neck, arm discomfort, activity-induced nausea,  vomiting or excessive diaphoresis.  She  has had no palpitations,  presyncope or syncope.  She denies any PND or orthopnea.   PAST MEDICAL HISTORY:  1. Coronary artery disease (status post non-Q-wave myocardial      infarction, 2008, and angioplasty/stenting then and as described      above).  2. Hypertension.  3. Diabetes mellitus.  4. Dyslipidemia.  5. Obesity.  6. Asthma  7. Renal insufficiency.  8. Hyponatremia, diastolic heart failure.   ALLERGIES:  None.   MEDICATIONS:  1. Caltrate.  2. Whole Source dietary vitamin.  3. Pravastatin 40 mg daily.  4. Advair 250/50.  5. Triamterine/hydrochlorothiazide 37.5/25.  6. Aspirin 81 mg daily.  7. Carvedilol 25 mg b.i.d.  8. Plavix 75 mg daily.  9. Levemir.  10.Furosemide 20 mg daily.  11.Insulin.  12.Lisinopril 10 mg daily.   REVIEW OF SYSTEMS:  As stated in the HPI and otherwise negative for  other systems.   PHYSICAL EXAMINATION:  The patient is in no distress.  Blood pressure 132/59, heart rate 67 and regular.  HEENT:  Eyelids unremarkable.  Pupils are  equal, round and reactive to  light.  Fundi not visualized.  Oral mucosa unremarkable.  NECK:  No jugular venous distention at 45 degrees.  Carotid upstroke  brisk and symmetric, no bruits.  No thyromegaly.  LYMPHATICS:  No cervical, axillary or inguinal adenopathy.  LUNGS:  Clear to auscultation bilaterally.  BACK:  No costovertebral angle tenderness.  CHEST:  Unremarkable.  HEART:  PMI not displaced or sustained.  S1 and S2 within normal limits.  No S3, no S4, no clicks, no rubs, no murmurs.  ABDOMEN:  Obese.  Positive bowel sounds, normal in frequency and pitch.  No bruits, no rebound, no guarding, no midline pulsatile mass, no  hepatomegaly, no splenomegaly.  SKIN:  No rashes, no nodules.  EXTREMITIES:  Pulses 2+.  No edema.   EKG:  Sinus rhythm, rate 67, axis within normal limits, intervals within  normal limits, no acute ST-T wave changes.   ASSESSMENT AND PLAN:  1. Coronary disease:  The patient is  doing well, status post      percutaneous transluminal coronary intervention and stenting.  She      is to continue on Plavix indefinitely.  We will continue with      aggressive risk reduction.  No further cardiovascular testing is      suggested at this time.  2. Dyslipidemia.  Her LDL on 20 mg of Pravastatin was 133; that was      increased to 40.  She has been relatively intolerant of other      drugs.  I am going to work hard though to manage this aggressively.  3. Hypertension.  Blood pressure is controlled.  She will continue      with the medications as      listed.  4. Followup:  I will see her back in about 2 months or sooner if      needed.     [name], MD, [facility]  Electronically Signed    JH/MedQ  DD: 01/10/2007  DT: 01/11/2007  Job #: [id number]   cc:   [name], M.D.

## 2010-09-16 NOTE — Assessment & Plan Note (Signed)
[facility]                            CARDIOLOGY OFFICE NOTE   AMAYIAH, GOSNELL                     MRN:          [id number]  DATE:03/01/2007                            DOB:          Aug 25, 1927    PRIMARY CARE PHYSICIAN:  Turkey R. Rankins, M.D.   REASON FOR PRESENTATION:  Evaluate patient with coronary disease and  hypertension.   HISTORY OF PRESENT ILLNESS:  The patient is 77 years old. She has a  history of coronary disease as described below. She has had no further  chest discomfort since I last saw her. She has some chronic dyspnea with  exertion, but this is not any worse than previous. She has no resting  shortness of breath. Denies any PND or orthopnea. She admits to dietary  indiscretion which she believes is the cause of her elevated blood  pressure today. She was losing weight, but has actually gained back 4-5  pounds. She denies any chest pain, neck or arm discomfort. She has had  no palpitations, pre-syncope or syncope.   PAST MEDICAL HISTORY:  1. Coronary artery disease (Left main 30% stenosis followed by distal      50% stenosis; the left anterior descending artery had ostial 30%      stenosis followed by proximal long 30% stenosis. There was distal      50% stenosis. The first diagonal had 40% stenosis. Circumflex had      ostial long 99% stenosis in a previously stented area. There was      30% stenosis after the stent. There were mid stents traveling into      a marginal. There was diffuse in-stent re-stenosis. The right      coronary artery had 25-30% stenosis. The posterior descending      artery had 60% stenosis.) She had subsequently percutaneous      coronary intervention of the in-stent re-stenosis using a new      Promus stent and cutting balloon angioplasty. She had percutaneous      coronary intervention of the in-stent re-stenosis in the mid      circumflex using cutting balloon.  2. Mildly reduced ejection  fraction (45-50%).  3. Hypertension.  4. Diabetes mellitus.  5. Dyslipidemia.  6. Obesity.  7. Asthma.  8. Renal insufficiency.  9. Hyponatremia.  10.Diastolic heart failure.   ALLERGIES:  None.   CURRENT MEDICATIONS:  1. Caltrate.  2. Whole source dietary.  3. Advair.  4. Triamterene/hydrochlorothiazide 7.5/25.  5. Aspirin 81 mg daily.  6. Carvedilol 25 mg b.i.d.  7. Plavix 75 mg daily.  8. Levemir.  9. Furosemide 20 mg a day.  10.Insulin.  11.Lisinopril 10 mg daily.  12.Pravastatin 80 mg daily.   REVIEW OF SYSTEMS:  As stated in the HPI and otherwise negative for  other systems.   PHYSICAL EXAMINATION:  The patient is in no distress. Blood pressure  210/90, heart rate 70 and regular. Weight 227 pounds. BMI is 41.  HEENT: Eyelids unremarkable. Pupils equal, round, and reactive to light.  Fundi not visualized.  NECK: No jugular venous distention at 45 degrees.  Carotid upstroke brisk  and symmetrical. No bruits, no thyromegaly.  LYMPHATICS: No cervical, axillary or inguinal adenopathy.  LUNGS: Clear to auscultation bilaterally.  BACK: No costovertebral angle tenderness.  CHEST: Unremarkable.  HEART: PMI not displaced or sustained. S1, S2 within normal limits. No  S3. No S4. No clicks, rub or murmurs.  ABDOMEN: Obese, positive bowel sounds, normal in frequency and pitch. No  bruits. No rebounds. No guarding. No midline pulsatile mass. No  hepatomegaly, splenomegaly.  SKIN: No rashes, no nodules.  EXTREMITIES: 2+ pulses throughout.   ASSESSMENT/PLAN:  1. Hypertension. Blood pressure is elevated today. I suspect it is      elevated more frequently than she realizes. For management of her      diastolic heart failure, risk reduction and slightly reduced      ejection fraction, I am going to increase the lisinopril to 20 mg      daily. She will get a BMET in two weeks. She will continue to have      the medications as listed. She needs to lose weight.  2. Coronary  disease. She is having no symptoms consistent with new      stenosis. She will continue with secondary risk reduction. She      knows she cannot stop the Plavix.  3. Obesity. We talked about the need to lose weight with diet and      exercise.  4. Diabetes mellitus per Dr.  Luciana Axe.  5. Dyslipidemia. The patient is tolerating pravastatin. She had a      slightly elevated LDL recently. When she comes back we will get      another lipid profile.  6. Followup. Will see the patient back in about four months or sooner      if needed.     [name], MD, [facility]  Electronically Signed   JH/MedQ  DD: 03/01/2007  DT: 03/01/2007  Job #: [id number]   cc:   [name], M.D.

## 2010-09-16 NOTE — Assessment & Plan Note (Signed)
OFFICE VISIT   [name], [name]  DOB:  01/10/1928                                        April 21, 2007  CHART #:  [id number]   HISTORY OF PRESENT ILLNESS:  The patient is a 77 year old black female  who is now one month status post urgent coronary artery bypass grafting  x3.  She is in the office for her first postoperative visit.  She  reports overall that she has been making slow but steady progress.  She  describes some shortness of breath, but does feel that this is improving  over time.  Additionally she has had some mild chest discomfort  associated with her incision, but this also is improving and she uses no  pain medication at this time.  She also describes peripheral edema  although this also is reported as improving over time.  She reports that  she is not going to cardiac rehabilitation, but does ambulation on her  own, catching the bus to go to [employer] to take college classes.  She also  walks the steps while she is there.  She canceled her postoperative  cardiology appointment as she was out of town at that time, but is  scheduled to see [name], MD, [facility] in the office next week.   Chest x-ray was reviewed and it reveals improving aeration with  decreased bibasilar atelectasis.  There is also trace bilateral pleural  effusions.  This is improved from previous film.   PHYSICAL EXAMINATION:  VITAL SIGNS:  Blood pressure 163/80, pulse 85,  respirations 18, oxygen saturation 93% on room air.  GENERAL:  Elderly black female in no acute distress.  LUNGS:  Clear breath sounds without rales, rhonchi, or wheeze.  HEART:  Regular rate and rhythm.  Normal S1 and S2 without murmurs,  rubs, or gallops.  EXTREMITIES:  The right lower extremity has significant edema from the  level of the foot to the knee.  The left side has edema, but much less  so.  Incisions are all healing well without evidence of infection.   ASSESSMENT:  The patient is  improving over time.  She is encouraged to  continue her ambulation.  We have instructed her to double her Lasix  dosage from 20 mg daily to 40 mg.  She will be reassessed by Dr.  Antoine Poche in his office next week to further manage her volume overload.  She is encouraged to continue her sternal precautions with no lifting  more than approximately 20 pounds.  In regard to follow-up at the  surgery office, this will be now on a p.r.n. basis.   Rowe Clack, P.A.-C.   Sherryll Burger  D:  04/21/2007  T:  04/22/2007  Job:  [id number]   cc:   [name], MD, [facility]  [name], M.D.

## 2010-09-16 NOTE — Discharge Summary (Signed)
NAME:  [name], [name]              ACCOUNT NO.:  [id number]   MEDICAL RECORD NO.:  [id number]          PATIENT TYPE:  INP   LOCATION:  6531                         FACILITY:  [facility]   PHYSICIAN:  Kristina Beals. Juanda Chance, MD, FACCDATE OF BIRTH:  [birth year]   DATE OF ADMISSION:  12/23/2006  DATE OF DISCHARGE:  12/28/2006                               DISCHARGE SUMMARY   PRIMARY CARDIOLOGIST:  Kristina Rotunda, MD, [facility]   PRIMARY CARE PHYSICIAN:  [name], M.D.   DISCHARGE DIAGNOSES:  Unstable angina, coronary artery disease.   SECONDARY DIAGNOSES:  1. Hypertension.  2. Hyperlipidemia.  3. Type 2 diabetes mellitus.  4. Obesity.  5. Asthma.  6. Renal insufficiency.  7. Hyponatremia.  8. History of diastolic congestive heart failure.   ALLERGIES:  No known drug allergies.   PROCEDURES:  Left heart cardiac catheterization with successful PCI and  cutting balloon angioplasty as well as drug-eluting stent placement  within the circumflex.   HISTORY OF PRESENT ILLNESS:  This is a 77 year old African-American  female with prior history of coronary artery disease status post non-ST-  elevation MI in April 2008 with successful drug-eluting stent placement  in the proximal left circumflex who recently saw Dr. Antoine Richardson in clinic  on August 14 with a vague complaint of chest discomfort.  She  subsequently underwent Myoview functional testing revealing an EF of 45%  as well as inferolateral infarct with peri-infarct ischemia.  As her EF  was down from previous documentation, decision was made to perform  cardiac catheterization.   HOSPITAL COURSE:  The patient was initially brought in for outpatient  cardiac catheterization.  However, this showed significant in-stent  restenosis within the left circumflex.  Further, she had chest  discomfort, and decision was made to admit her for PCI.  Cardiac markers  did reveal a slight bump with a CK of 179, MB of 3.6, troponin I of  0.33.  She had  no recurrence of chest discomfort over the weekend and  was taken back to the catheterization lab on August 25 when she  underwent successful cutting balloon angioplasty as well as placement of  a 2.5 x 12 mm PROMUS drug-eluting stent.  She tolerated this procedure  well and postprocedure has been ambulating without recurrent chest  discomfort.  She will be discharged home today in satisfactory  condition.   DISCHARGE LABORATORY DATA:  Hemoglobin 10.1, hematocrit 29.9, WBC 6.3,  platelets 152, MCV 94.3. Sodium 139, potassium 4.6, chloride 105, CO2  29, BUN 19, creatinine 1.23, glucose 112.  Total bilirubin 0.7, alkaline  phosphatase 72, AST 16, ALT 16, albumin 3.0. CK 144, MB 4.1. Total  cholesterol 165, triglycerides 100, HDL 33, LDL 112. Calcium 9.5.  TSH  0.782.   DISPOSITION:  Patient is being discharged home today in good condition.   FOLLOWUP PLANS AND APPOINTMENTS:  [name] has followup with Dr.  Antoine Richardson on September 8 at 9:30 a.m.[name]  She has followup with Dr. Luciana Richardson  as previously scheduled.   DISCHARGE MEDICATIONS:  1. Aspirin 325 mg daily  2. Plavix 75 mg daily  3. Advair  250/50 one puff 3 times a day.  4. Caltrate dietary supplement daily  5. Triamterene/hydrochlorothiazide 37.5/25 mg daily,  6. Lisinopril 20 mg daily.  7. Carvedilol 25 mg b.i.d.  8. Furosemide 20 mg daily.  9. Levemir as previously prescribed  10.Pravachol 4 mg nightly.  11.Regular insulin FlexPen as previously prescribed  12.Nitroglycerin 0.4 mg sublingual p.r.n. chest pain.   OUTSTANDING LABORATORY STUDIES:  None.   DURATION OF DISCHARGE ENCOUNTER:  45 minutes including physician time.      Nicolasa Ducking, ANP      Bruce R. Juanda Chance, MD, [facility]  Electronically Signed    CB/MEDQ  D:  12/28/2006  T:  12/28/2006  Job:  [id number]   cc:   Fanny Dance. Richardson, M.D.

## 2010-09-16 NOTE — Consult Note (Signed)
NAME:  [name], [name] NO.:  [id number]   MEDICAL RECORD NO.:  [id number]          PATIENT TYPE:  INP   LOCATION:  3701                         FACILITY:  [facility]   PHYSICIAN:  Sheliah Plane, MD    DATE OF BIRTH:  1928-03-26   DATE OF CONSULTATION:  03/16/2007  DATE OF DISCHARGE:                                 CONSULTATION   REQUESTING PHYSICIAN:  [name], M.D.   Elenor Quinones CARDIOLOGIST:  [name], M.D.   PRIMARY CARE PHYSICIAN:  Turkey R. Rankins, M.D., [facility].   REASON FOR CONSULTATION:  Recurrent in-stent stenosis and progression of  coronary artery disease with unstable angina.   HISTORY OF PRESENT ILLNESS:  The patient is a 77 year old female with  known coronary occlusive disease. Her symptoms started with a non-ST MI  in April of 2008 and underwent placement of 2 ostial drug-eluting stents  in the proximal circumflex and 1 in the mid circumflex.  In April of  2008, she was started on Plavix at that time. She developed recurrent  symptoms in August of 2008 with restenosis and had a cutting balloon and  restenting from the ostium of the circumflex. She initially did well but  over the past 2 weeks has had increasing episodes of substernal chest  pain with shortness of breath. She denies nausea or vomiting with it. In  addition, she underwent cardiac catheterization today after having an  episode of pain early this morning and then after her arrival to the  floor prior to me seeing her had what she described as the worst episode  of pain that she had that was relieved with nitroglycerin. She is now  pain free on nitroglycerin drip.   Cardiac risk factors include hypertension, hyperlipidemia, positive  family history - mother died at age 21 of myocardial infarction -  father's history is unknown. She has 25-year history of diabetes, is a  smoker but quit more than 40 years ago. The patient has a history of  asthma, chronic  renal insufficiency, acute diastolic congestive heart  failure and chronic diastolic congestive heart failure. Denies previous  stroke. Denies claudication. Does have known renal insufficiency.  Current creatinine is 1.6. She has had no previous surgery before. The  patient lives with her grandson. Does not use any alcohol.   CURRENT MEDICATIONS:  Include:  1. Advair inhaler 250/50 b.i.d.  2. Lasix 20 a day.  3. Lisinopril 20 a day.  4. Triamterene hydrochlorothiazide 37.5/25 daily.  5. Aspirin 81 mg a day.  6. Plavix 75 mg a day. She received her dose of Plavix today.  7. Coreg 25 mg b.i.d.  8. Pravastatin 80 mg at night.  9. Levemir 32 units q.a.m.  10.Nitroglycerin p.r.n.   Cardiac review of systems is positive for chest pain, exertional  shortness of breath, palpitations, lower extremity edema. She denies  resting shortness of breath. General review of systems:  The patient's  weight has been stable. Denies constitutional symptoms. She denies any  visual changes. Denies amaurosis or TIAs. Denies hematuria. Denies blood  in her stool. Since being on Plavix,  she has had no increasing bleeding  tendency. Denies psychiatric history. Does have complaint of numbness in  the lower extremities.   On exam, the patient is resting on 3700, awake and alert on  nitroglycerin drip. Blood pressure 154/78, heart rate 64, respiratory  rate 18, temperature is 98.2, O2 saturation is 94% on room air.  The patient's lungs are clear bilaterally.  CARDIAC EXAM:  Reveals regular rate and rhythm without murmur or gallop.  ABDOMINAL EXAM:  Is benign. She is moderately obese. There is a palpable  mass.  The groin site is free of significant bleeding or hematoma.  LOWER EXTREMITIES:  Appear to have adequate veins at the ankles. She has  +1 DP and PT pulses bilaterally.   LABORATORY FINDINGS:  CK was 198, MB was 3.4. Hematocrit was 28, white  count was 4.9. Troponin is 0.05.   Cardiac catheterization  films are reviewed and discussed with Dr.  Antoine Poche. The patient has greater than 90% stenosis of the ostium of the  circumflex. Also has dampening and ostial right coronary artery stenosis  with areas of at least sequential 70-80% lesions of the right.  LAD has  proximal 60% stenosis. Ventriculogram was not done to avoid dye load.   IMPRESSION:  Patient with unstable angina with at least two episodes of  pain today with restenosis of critical ostial circumflex and three-  vessel disease. The patient is now pain free on nitroglycerin and  heparin. Ideally, we would like to wait 5-7 days before proceeding with  surgery to allow washout of the Plavix. However, with her symptoms, it  is very unlikely that she will remain stable for that long. After  discussing with Dr. Antoine Poche, we will move the patient to the intensive  care unit on heparin and nitroglycerin and proceed with bypass surgery  tomorrow. I have discussed the risks and options with the patient and  her daughter, specifically risks of death, infection, stroke, myocardial  infarction, bleeding, blood transfusion. We specifically discussed the  risk of increasing risk of bleeding and requirements for transfusion  because of the use of Plavix. The patient is agreeable with this and  will obtain an echocardiogram and Doppler studies this evening.      Sheliah Plane, MD  Electronically Signed     EG/MEDQ  D:  03/16/2007  T:  03/16/2007  Job:  [id number]

## 2010-09-16 NOTE — Assessment & Plan Note (Signed)
[facility]                            CARDIOLOGY OFFICE NOTE   Kristina, Richardson                     MRN:          [id number]  DATE:11/16/2006                            DOB:          1927/12/19    PRIMARY:  Dr. Beverley Fiedler.   REASON FOR PRESENTATION:  Evaluate patient with coronary artery disease.   HISTORY OF PRESENT ILLNESS:  The patient returns for followup. Since I  last saw her, she has been trying to walk.  She says she does this about  20 minutes 3 or 4 days a week.  She tries to go up stairs.  She says her  legs feel sluggish.  She does not feel like she has the energy that she  should.  Unfortunately, her weight has gone up rather than gone down.  She says she is not really dieting.  She has not had any new chest  complaints.  She has not had any more blurred vision.  She has not had  any loss of speech or motor.  She has not had any chest discomfort, neck  or arm discomfort.  She has not had any palpitation, no presyncope or  syncope.  She has had no PND or orthopnea.   PAST MEDICAL HISTORY:  Coronary artery disease (status post non-Q-wave  myocardial infarction August 27, 2006.  A 99% proximal circumflex treated  with drug-eluting stent, mid 70% circumflex treated with drug-eluting  stent, the left main had 30% stenosis, the LAD 50% stenosis, the PDA 50%  stenosis, OM 40% stenosis.  EF of 60%).  Hypertension, diabetes  mellitus, dyslipidemia, obesity, asthma, renal insufficiency,  hyponatremia, diastolic heart failure.   ALLERGIES:  None.   MEDICATIONS:  1. Multivitamin.  2. Levemir.  3. Plavix 75 mg daily.  4. Triamterene/hydrochlorothiazide 37.5/25.  5. Aspirin 325 mg daily.  6. Coreg 12.5 mg b.i.d.  7. Lisinopril 10 mg daily.  8. Calcium.  9. __________ 40 mg daily.   REVIEW OF SYSTEMS:  As stated in the HPI.  Otherwise negative for other  systems.   PHYSICAL EXAMINATION:  The patient is in no acute distress.  Blood pressure 182/94, heart rate 77 and regular.  Weight 229 pounds.  Body mass index 41.  HEENT:  Eyelids unremarkable, pupils equal, round and reactive to light,  fundi not visualized.  Oral mucosa unremarkable.  NECK:  No jugular venous distention at 45 degrees, carotid upstroke  brisk and symmetric, no bruits, no thyromegaly.  LYMPHATICS:  No cervical, axillary, inguinal adenopathy.  LUNGS:  Clear to auscultation bilaterally.  BACK:  No costovertebral angle tenderness.  CHEST:  Unremarkable.  HEART:  PMI not displaced or sustained.  S1 and S2 within normal limits.  No S3, no S4, no clicks, no rubs, no murmurs.  ABDOMEN:  Obese, positive bowel sounds, normal in frequency and pitch.  No bruits, no rebound, no guarding, no midline pulsatile mass, no  hepatomegaly, no splenomegaly.  SKIN:  No rashes, no nodules.  EXTREMITIES:  2+ pulses throughout, mild bilateral ankle edema, no  cyanosis or clubbing.  NEURO:  Oriented to person,  place and time.  Cranial nerves II through  XII grossly intact, motor grossly intact.   ASSESSMENT AND PLAN:  1. Coronary artery disease.  The patient is having no symptoms related      to this.  No further cardiovascular testing is suggested.  She will      continue the medications as listed with the change as described      below.  2. Hypertension.  She is still encouraged with lifestyle changes and      we discussed these again.  However, I do not know that she is going      to accomplish this.  I am going to increase the lisinopril to 20 mg      daily.  She will be given written instructions to get a BMET in 2      weeks.  She had some mild renal insufficiency and we will watch      this closely.  3. Obesity as above.  4. Dyslipidemia.  This will be followed by Dr. Barbaraann Barthel.  The goal will      be an LDL of less than 70 and HDL in the 40s.  5. Diastolic heart failure.  This will be managed in the context of      treating her blood pressure and also  educating her.  A Dietary      Approach to Stop Hypertension diet would be a reasonable one for      her.  6. Followup.  We will see her back in about 4 months or sooner if      needed.     [name], MD, [facility]  Electronically Signed    JH/MedQ  DD: 11/16/2006  DT: 11/17/2006  Job #: [id number]   cc:   [name], M.D.

## 2010-09-16 NOTE — H&P (Signed)
NAME:  BALERIA, WYMAN NO.:  [id number]   MEDICAL RECORD NO.:  [id number]          PATIENT TYPE:  INP   LOCATION:  5017                         FACILITY:  [facility]   PHYSICIAN:  Manus Gunning, MD      DATE OF BIRTH:  April 28, 1928   DATE OF ADMISSION:  10/12/2008  DATE OF DISCHARGE:                              HISTORY & PHYSICAL   ADMITTING SERVICE:  Hospitalist Service.   CHIEF COMPLAINT:  Blood clots per rectum.   HISTORY OF PRESENT ILLNESS:  Ms. Crumbley is a pleasant 77 year old  African American female who presented today post passing a large blood  clot during a bowel movement.  She claims that since this and since her  initial presentation she has had additional problem in the emergency  department and all have consisted of blood clots.  She claims that this  has never happened before and that she has had a colonoscopy in the  remote past for which she cannot exactly when.  It is not part of our  electronic medical record.  She denies any abdominal pain, no nausea or  vomiting, no straining during bowel movement, no constipation, no  diarrhea, no melenic stools.  She describes them as dark black maroon  colored clots that come out with her feces.  There is no dysuria,  polyuria or hematuria.  No chest pain, palpitations, PND or orthopnea.  No cough, no expectoration.  No dyspnea on exertion.  No loss of  consciousness, no syncope, no presyncope, no headaches, no blurring of  vision, no tinnitus.   PAST MEDICAL/SURGICAL HISTORY:  1. CAD, status post CABG with stents.  2. History of diabetes mellitus type 2.  3. Hypertension.  4. Dyslipidemia.  5. Asthma.   FAMILY HISTORY:  Mother had heart disease, father unknown.   SOCIAL HISTORY:  Quit tobacco 40 years ago.  Denies illicits, denies  alcohol.   ALLERGIES:  THE PATIENT HAS NO KNOWN DRUG ALLERGIES.   HOME MEDICATIONS:  Unfortunately, the patient could not recall and there  is no recent discharge or  clinic notes to verify the patient's home  medications.  She claims that her daughter will obtain her medicines  tomorrow and bring them to the hospital.   REVIEW OF SYSTEMS:  Essentially a 14-point review of systems performed  with pertinent positives and negatives as described above.   PHYSICAL EXAMINATION:  VITAL SIGNS:  At the time of presentation,  temperature 98.6, heart rate 75, respiratory rate 16, blood pressure  200/81.  O2 saturation 97% on room air.  GENERAL:  A well-nourished, well-developed African American female  sleeping in bed, easily arousable.  HEENT:  Normocephalic, atraumatic.  Moist oral mucosa.  No thrush,  erythema or postnasal drip.  Eyes anicteric.  Extraocular muscles are  intact.  Pupils are equal and reactive to light and accommodation.  CARDIOVASCULAR:  S1-S2 normal.  Regular rate and rhythm.  No murmurs,  rubs or gallops.  RS:  Air entry is bilaterally equal.  No rales, rhonchi or wheezes  appreciated.  ABDOMEN:  Soft, nontender, nondistended.  Positive bowel sounds.  No  organomegaly.  EXTREMITIES:  No clubbing, cyanosis or edema.  Positive bilateral  dorsalis pedis.  CNS:  Alert and oriented x3 after waking patient from sleep.  Power,  sensation and reflexes bilaterally symmetrical.  SKIN:  No breakdown, swelling, ulcerations or masses.  HEM/ONC:  No palpable lymphadenopathy, ecchymosis, bruising or  petechiae.   LABORATORY TESTS:  Sodium 129, potassium 4.6, chloride 97, CO2 of 22,  glucose 481.  BUN 21, creatinine 1.25, T bili 1, alk phos 46, AST 18,  ALT 20, __________, albumin 3.8, calcium 8.6.  INR 1.1, Protime 14.3.  White blood cell count 14,900.  Hemoglobin 11.1, hematocrit 33.2,  platelet count 145, polymorphs 58.  Troponin-I less than 0.05.  CK-MB  2.2, myoglobin 141.   ASSESSMENT/PLAN:  1. Hematochezia with bright red blood clots per rectum.  The patient      does not describe them overtly as bright red blood, but more likely       maroon __________.  At this time, we believe this is most likely      secondary to her diverticulosis which has been described on her CT      abdomen and pelvis.  We will check serial hemoglobin and hematocrit      q.4 hours.  If hemoglobin drops below 9, we will transfuse 2 units      of packed red blood cells in light of the patient's coronary artery      disease.  At this time, we will type and cross and hold 2 units and      continue to monitor.  Likely, the patient will require a      gastroenterology consult to evaluate further.  2. Hypertension, currently better controlled post emergency department      p.r.n. medications.  We will start hydralazine 10 mg IV q.6 hours.      The systolic blood pressure is greater than 180, we will hold all      p.o. medications at this time, especially since we do not know      exactly what they are.  I cannot corroborate  the emergency      department list whatsoever at this time.  The patient cannot      provide me with any names and we will await the daughter's arrival      in the morning with her home medications.  3. Diabetes.  Again, cannot corroborate the dose, the patient does      know.  Therefore, __________protocol, especially since the patient      is going to be n.p.o. until further orders.  4. Gastrointestinal and deep venous thrombosis prophylaxis.  Protonix      40 mg IV daily, as well as sequential compression devices.      Manus Gunning, MD     SP/MEDQ  D:  10/12/2008  T:  10/12/2008  Job:  [id number]

## 2010-09-16 NOTE — Consult Note (Signed)
NAME:  [name], [name] NO.:  [id number]   MEDICAL RECORD NO.:  [id number]          PATIENT TYPE:  INP   LOCATION:  5017                         FACILITY:  [facility]   PHYSICIAN:  Jordan Hawks. Elnoria Howard, MD    DATE OF BIRTH:  01/12/1928   DATE OF CONSULTATION:  10/12/2008  DATE OF DISCHARGE:                                 CONSULTATION   REASON FOR CONSULTATION:  Diverticular bleed.   This is an unassigned patient from the Hospitalist Service.   HISTORY OF PRESENT ILLNESS:  This is a 77 year old female with a past  medical history of coronary artery disease status post bypass with stent  placement, diabetes, hypertension, hyperlipidemia, asthma, and morbid  obesity.  She was admitted to the hospital with complaints of pain with  hematochezia.  The patient states that she had multiple bouts of bloody  bowel movements when she was at home, subsequently she presented to the  emergency room, had 4 more bowel movements and today she reports having  6 bowel movements.  No complaints of any abdominal pain, nausea,  vomiting, fevers, or chills.  In the past, she had a colonoscopy, but  she cannot remember who performed and what was exactly identified at  that time.  A CT scan was performed in the emergency room.  She was  noted to have descending colon and sigmoid diverticulosis.  At this  time, she denies any chest pain, shortness of breath, or dyspnea on  exertion.   PAST MEDICAL HISTORY:  As stated above.   PAST SURGICAL HISTORY:  As stated above.   FAMILY HISTORY:  Noncontributory.   SOCIAL HISTORY:  Negative for alcohol, tobacco, or illicit drug use.   ALLERGIES:  No known drug allergies.   MEDICATIONS:  1. Coreg 12.5 mg p.o. b.i.d.  2. Sliding scale insulin.  3. Cozaar 50 mg p.o. daily.  4. Protonix 40 mg IV daily.  5. Zocor 40 mg p.o. daily.  6. Tylenol 650 mg p.o. q.4 h. p.r.n.  7. Hydralazine 10 mg IV q.6 h. p.r.n.  8. Zofran 4 mg IV q.6 h p.r.n.   ALLERGIES:   No known drug allergies.   REVIEW OF SYSTEMS:  As stated above in the history of present illness,  otherwise, negative.   PHYSICAL EXAMINATION:  VITAL SIGNS:  Blood pressure is 160/74, heart  rate is 61, respirations 20, and temperature is 97.9.  No evidence of  any orthostasis.  GENERAL:  The patient is in no acute distress.  Alert and oriented.  HEENT:  Normocephalic, atraumatic.  Extraocular muscles intact.  NECK:  Supple.  No lymphadenopathy.  LUNGS:  Clear to auscultation bilaterally.  CARDIOVASCULAR:  Regular rate and rhythm.  ABDOMEN:  Morbidly obese, soft, nontender, and nondistended.  EXTREMITIES:  No clubbing, cyanosis, or edema.  RECTAL:  Fresh blood on the examination.  No masses palpated.   LABORATORY VALUES:  White blood cell count is 5.0, hemoglobin 9.3, MCV  is 96.7, platelets 137.  Sodium 138, potassium 5.1, chloride 104, CO2 of  28, glucose 114, BUN 41, and creatinine 1.3.  Total bilirubin 0.8,  alk  phos 86, AST is 18, ALT of 14.   IMPRESSION:  1. Diverticular bleed.  2. History of coronary artery bypass, previously on Plavix.  At this      time, the patient is stable hemodynamically.  Given her      presentation and the findings on her CT scan, I believe this is a      diverticular bleed.  Hopefully, this should stop, if needed      transfusions can be provided.   PLAN:  Plan at this time is supportive care and if bleeding persists  then GI endoscopic evaluations can be performed and possible surgical  intervention if the bleeding does not cease.      Jordan Hawks Elnoria Howard, MD  Electronically Signed     PDH/MEDQ  D:  10/12/2008  T:  10/13/2008  Job:  [id number]

## 2010-09-16 NOTE — Cardiovascular Report (Signed)
NAME:  [name], [name] NO.:  [id number]   MEDICAL RECORD NO.:  [id number]          PATIENT TYPE:  INP   LOCATION:  2304                         FACILITY:  [facility]   PHYSICIAN:  [name], MD, FACCDATE OF BIRTH:  03/23/1928   DATE OF PROCEDURE:  03/16/2007  DATE OF DISCHARGE:                            CARDIAC CATHETERIZATION   PRIMARY CARE PHYSICIAN:  Dr. Barton Fanny.   CARDIOLOGIST:  Dr. Antoine Poche.   PROCEDURE:  Left heart catheterization/coronary arteriography.   INDICATION FOR PROCEDURE:  Evaluate patient with chest pain and unstable  angina.  She has had multiple interventions to her circumflex.  She has  had restenosis of a drug-eluting stent to the ostium with cutting  balloon angioplasty within the stent.   PROCEDURE NOTE:  Left heart catheterization performed via right femoral  artery.  The artery was cannulated using anterior wall puncture with #6-  French arterial sheath inserted via the modified Seldinger technique.  Judkins and pigtail catheter were utilized.  The patient a procedure  well and left the lab in stable condition.   RESULTS:  Hemodynamics:  LV1 73/21, AO 175/67.  Coronaries:  Left main  had distal 25% stenosis.  The proximal and ostial LAD had 40-50%  stenosis.  There is mid 30% stenosis.  There is a more distal 30%  stenosis.  First diagonal was moderate-sized with mid 40% stenosis.  The  circumflex had ostial long 99% stenosis in a previously stented area.  The remainder of the mid stent covering the marginal had diffuse luminal  irregularities.  Mid obtuse marginal was small without high-grade  disease.  The right coronary artery was the dominant vessel.  There was  ostial 80% stenosis.  There were diffuse luminal irregularities.  The  PDA was somewhat small to moderate and had long proximal 70% stenosis.  Left ventriculogram:  We crossed the left ventricle for pressures but  did not inject secondary to some  insufficiency.   CONCLUSION:  Severe circumflex and right coronary artery stenosis.  There is recurrent stenosis in the circumflex.  There is moderate LAD  disease.  Given the recurrent high-grade nature of the circumflex as  well as the RCA stenosis, the patient will be referred for consideration  of CABG.      [name], MD, [facility]  Electronically Signed     JH/MEDQ  D:  03/16/2007  T:  03/17/2007  Job:  [id number]   cc:   [name], M.D.

## 2010-09-16 NOTE — Assessment & Plan Note (Signed)
[facility]                            CARDIOLOGY OFFICE NOTE   [name], RORABAUGH                     MRN:          [id number]  DATE:10/22/2006                            DOB:          [birth year]    PRIMARY:  Dr. Beverley Fiedler.   REASON FOR PRESENTATION:  Evaluate patient status post hospitalization  for blurred vision.   HISTORY OF PRESENT ILLNESS:  The patient was last admitted on May 30 for  blurred vision.  No cardiac etiology was clearly identified for this.  She had previously been admitted on April 25 with a non-Q-wave  myocardial infarction.  She had coronary anatomy as described below.   Since being home she has continued to have occasional chest discomfort.  However, it is sporadic.  It is an aching discomfort.  It hurts in her  shoulders and spreads to her chest.  This is similar to symptoms that  she has had for quite awhile and not similar to the pain she had at the  time of her non-Q-wave myocardial infarction.  She tries to walk stairs  and walk a hallway.  With this she can bring on this discomfort.  She  denies any resting symptoms such as substernal chest pressure, neck or  arm discomfort consistent with previous angina.  She has no shortness of  breath, PND or orthopnea.  She had no palpitations, presyncope or  syncope.  Of note, she is taking her aspirin only every other day.  She  is currently not taking her Vytorin.   PAST MEDICAL HISTORY:  1. Coronary artery disease (status post non-Q-wave myocardial      infarction August 27, 2006.  Heart catheterization demonstrated 99%      proximal circumflex treated with a drug-eluting stent, mid 70%      circumflex treated with drug-eluting stents, the left main had 30%      stenosis, LAD 50% stenosis, PDA 50% stenosis, OM 40% stenosis.      This was managed medically.  Her EF was 60%.).  2. Hypertension.  3. Diabetes mellitus.  4. Dyslipidemia.  5. Obesity.  6. Asthma.  7.  Renal insufficiency.  8. Hyponatremia.  9. Diastolic heart failure.   ALLERGIES:  None.   MEDICATIONS:  1. Multivitamin.  2. Levemir.  3. Plavix 75 mg daily.  4. Triamterene hydrochlorothiazide 37.5/25 daily.  5. Aspirin 325 mg daily.  6. Coreg 12.5 mg b.i.d.  7. Lisinopril 10 mg daily.  8. Calcium.  9. Coreg 40 mg daily.   REVIEW OF SYSTEMS:  As stated in the HPI and otherwise negative for  other systems.   PHYSICAL EXAMINATION:  The patient is in no acute distress.  Blood  pressure 133/65, heart rate 74 and regular, weight 225 pounds.  HEENT:  Eyes unremarkable, pupils equal, round, react to light, fundi  not visualized, oral mucosa unremarkable.  NECK:  No jugular venous distention at 45 degrees, carotid upstroke  brisk and symmetric, no bruits, no thyromegaly.  LYMPHATICS:  No cervical, axillary or inguinal adenopathy.  LUNGS:  Clear to auscultation bilaterally.  BACK:  No  costovertebral angle tenderness.  CHEST:  Unremarkable.  HEART:  PMI not displaced or sustained, S1 and S2 within normal limits,  no S3, no S4, no clicks, no rubs, no murmurs.  ABDOMEN:  Obese, positive bowel sounds normal in frequency and pitch, no  bruits, no rebound, no guarding, no midline pulses, no masses, no  organomegaly.  SKIN:  No rashes, no nodules.  EXTREMITIES:  Upper pulses 2+, 2+ femorals, absent dorsalis pedis and  posterior tibialis bilaterally, no cyanosis, no clubbing, no edema.  NEURO:  Oriented to person, place and time, cranial nerves II-XII  grossly intact, motor grossly intact.   EKG:  Sinus rhythm, rate 69, axis within normal limits, intervals within  normal limits, no acute ST-T wave changes.   ASSESSMENT AND PLAN:  1. Dizziness.  The patient did not have an etiology to her dizziness      identified.  She did have an outpatient carotid ultrasound that      demonstrated 60-79% right stenosis and 40-59% left stenosis.  She      is due to have followup of this in 6  months.  2. Coronary disease.  The patient had coronary disease as described.      She understands the importance of her medications, and in      particular the Plavix.  I am going to have her start taking an 81      mg aspirin rather than taking the adult aspirin every other day.      Will practice secondary risk reduction.  Dyslipidemia.  The patient      is currently not taking a statin.  I have convinced her to try      Pravastatin at 20 mg a day.  She can check lipid and liver in about      8 weeks.  3. Followup.  Will see the patient back in about 4 months or sooner if      needed.    [name], MD, [facility]  Electronically Signed   JH/MedQ  DD: 10/24/2006  DT: 10/24/2006  Job #: [id number]   cc:   [name], M.D.

## 2010-09-16 NOTE — H&P (Signed)
NAME:  KAMAURI, KATHOL NO.:  [id number]   MEDICAL RECORD NO.:  [id number]          PATIENT TYPE:  OBV   LOCATION:  1852                         FACILITY:  [facility]   PHYSICIAN:  Salvadore Farber, MD  DATE OF BIRTH:  [birth year]   DATE OF ADMISSION:  10/01/2006  DATE OF DISCHARGE:                              HISTORY & PHYSICAL   Primary care physician is Turkey R. Rankins, MD.  Primary cardiologist is [name], MD.   CHIEF COMPLAINTS:  Blurring vision.   HISTORY OF PRESENT ILLNESS:  Ms. Avera is a 77 year old African  American female with a history of coronary artery disease that was  diagnosed on August 27, 2006.  She was seen in the office Sep 07, 2006,  and doing well.   Today she was in her usual state of health upon a rising and got dressed  and was getting on the bus to run an errand.  While she was on the bus  she had sudden onset of blurred vision.  She stated that she did not  lose vision and did not feel that she was going to lose consciousness.  She stated that her vision was blurred but she could still see colors  and shapes.  She also had developed left shoulder pain last p.m. and  that was ongoing.  She had a little nausea and some shortness of breath.  Because of the associated left arm pain as well as the shortness of  breath and nausea, she took sublingual nitroglycerin x2.  Her symptoms  then resolved.  She contacted her family and came to the emergency room.  Currently she is asymptomatic.   Ms. Knoke has some ongoing complaints that include a multi-year history  of sharp pain in her left arm and shoulder that she has had in the past  and is felt secondary to bursitis.  She also complains of occasional  dull pain in her left shoulder that radiates into her upper arm.  She  states that she mainly gets this at night and it lasts approximately 5  minutes.  She states that reaches a 6/10.  She stated that these  symptoms preceded her MI  and they are not associated with exertion.  She  also complains of bilateral lower extremity weakness that she has had  for years but she states that she has able to ambulate 20-30 minutes  five times a week per rehab guidelines.  She complains of fatigue after  this and feels that she has to rest in the afternoons but states she is  doing her best.  She also complains of dyspnea on exertion but is not  short of breath at rest.  She denies orthopnea or PND and states that  her symptoms have improved slightly since she was discharged from the  hospital in April.   PAST MEDICAL HISTORY:  1. Diabetes.  2. Hypertension.  3. Hyperlipidemia.  4. Family history of coronary artery disease but not prematurely.  5. Obesity.  6. Non-ST segment elevation MI August 27, 2006, with circumflex 99%      proximally  treated with one drug-eluting stent and 70% mid stenosis      treated with another Endeavor drug-eluting stent.  7. Residual left main 30% stenosis, LAD 50% stenosis, PDA 50%, and OM-      1 40%, medical therapy recommended.  8. Preserved left ventricular function with an EF of 60% at      catheterization.  9. Chronic diastolic CHF.  10.CKD stage III.  11.History of asthma.   SURGICAL HISTORY:  Cardiac catheterization.   ALLERGIES:  No known drug allergies.   CURRENT MEDICATIONS:  Ms. Mannina is not able to name her medications and  states that she is taking a half of 1 pill and 1-1/2 of another pill  that is different from the medication list from our office.  Her family  has been requested to bring all of her pill bottles to the hospital so  that she can tell us exactly when she is taking.  Per the office visit  on Sep 07, 2006:   1. Advair 250/50 mcg b.i.d.  2. Aspirin 325 mg daily.  3. Lasix 20 mg daily.  4. Vytorin 10/80 mg daily.  5. Triamterene/hydrochlorothiazide 37.5/25 mg daily.  6. Plavix 75 mg daily.  7. NovoLog 10 32 units q.a.m.  8. Coreg 12.5 mg b.i.d.  9.  Lisinopril 10 mg daily.   SOCIAL HISTORY:  She lives in [place] with her grandson.  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  She quit tobacco 40 years ago and does not  abuse alcohol or drugs.  She feels that she eats a reasonably heart-  healthy diabetic diet and tries to exercise 20-30 minutes 5 times a  week.   FAMILY HISTORY:  Her mother died at age 52 with a history of coronary  artery disease.  Her father died in his 85s without any coronary artery  disease, and she is an only child.   REVIEW OF SYSTEMS:  She states that she has had the beginnings of chest  tightness approximately 8 times since discharge and each was relieved  with sublingual nitroglycerin x1, and none were associated with  exertion.  She denies any hematemesis, hemoptysis, melena.  She denies  any GI symptoms.  She feels that she gets constipated if she does not  get 4 prunes a day.  She denies depression or anxiety.  She has  occasional arthralgias and slight hearing loss.  Review of systems is  otherwise negative.   PHYSICAL EXAMINATION:  VITAL SIGNS:  Temperature is 97.4, blood pressure  initially 114/59, then 142/70, pulse 63, respiratory rate 22, O2  saturation 97% on room air.  GENERAL:  She is a well-developed, elderly, obese African American  female in no acute distress.  HEENT:  Normal.  NECK:  Supple.  There is no lymphadenopathy, thyromegaly or carotid  bruits appreciated.  She has JVD less than one half.  CARDIOVASCULAR:  Her heart is regular in rate and rhythm with an S1 and  S2 and no significant murmur, rub or gallop is noted.  She has slightly  decreased DP pulses, left greater than right, and 2+ bilateral radial  pulses.  No femoral bruits are noted.  LUNGS:  She has a few rales at the bases but her lungs are otherwise  clear.  SKIN:  No rashes or lesions are noted.  ABDOMEN:  Soft and nontender with active bowel  sounds. EXTREMITIES:  She has trace  to 1+ pedal edema noted.  MUSCULOSKELETAL:  There is no joint deformity or effusion and no spine  or CVA tenderness is noted.  NEUROLOGIC:  She is alert and oriented with cranial nerves II-XII  grossly intact.   Chest x-ray showed COPD with chronic changes and decreased lung volumes  with vascular crowding and atelectasis.   EKG:  Sinus bradycardia, rate 54 beats per minute, with Q waves leads II  and III and inverted T-waves in lead III that are slightly different  from an EKG post intervention August 28, 2006.   LABORATORY VALUES:  Hemoglobin 11.2, hematocrit 33, WBC 5.5, platelets  192.  Sodium 139, potassium 5.7, chloride 109, CO2 29, BUN 36,  creatinine 1.6, glucose 120.  Point care markers negative except for a  slightly elevated myoglobin at 293.   IMPRESSION:  Ms. Cody is a 77 year old female who is now 4 weeks post  percutaneous coronary intervention of the circumflex and no other  critical disease.  She has atypical left shoulder pain and a brief less  than 5-minute episode of blurring over the entire visual field.  The  symptoms are not suggestive of amaurosis fugax or cerebrovascular  accident.  I do not think symptoms are vascular but with recent  percutaneous coronary intervention, she will be admitted and cardiac  enzymes will be cycled.  If her enzymes are negative she can be  discharged in the morning with outpatient carotid Dopplers and follow-up  appointment already arranged.  She will be continued on her other  medications, and we will also check orthostatic vital signs.      Theodore Demark, PA-C      Salvadore Farber, MD  Electronically Signed    RB/MEDQ  D:  10/01/2006  T:  10/01/2006  Job:  [id number]   cc:   [name], M.D.

## 2010-09-16 NOTE — Cardiovascular Report (Signed)
NAME:  [name], [name] NO.:  [id number]   MEDICAL RECORD NO.:  [id number]          PATIENT TYPE:  INP   LOCATION:  2917                         FACILITY:  [facility]   PHYSICIAN:  [name], MD, FACCDATE OF BIRTH:  [birth year]   DATE OF PROCEDURE:  12/23/2006  DATE OF DISCHARGE:                            CARDIAC CATHETERIZATION   PRIMARY CARE PHYSICIAN:  Dr. Barton Fanny.   PROCEDURE:  Left heart catheterization/coronary angiography.   INDICATIONS:  Evaluate patient with recurrent dyspnea and chest  discomfort.  She has had previous stenting to her circumflex in the  ostial and mid location.  These were drug-eluting stents but I do not  have the details at the time of this dictation.  This was in April of  this year.  She did have a stress perfusion study which demonstrated new  inferolateral infarct from base to mid with peri-infarct ischemia.   PROCEDURE NOTE:  The catheterization was performed via the right femoral  artery. The artery was cannulated using anterior wall puncture.  A #4-  [place] arterial sheath was inserted via the modified Seldinger  technique.  A preformed Judkins and a pigtail catheter were utilized.  The patient tolerated the procedure well and left the lab in stable  condition.  She did have some chest discomfort.  This was relieved with  sublingual nitroglycerin.  She started on a nitroglycerin drip.   RESULTS:  HEMODYNAMICS:  LV 148/26, AO 145/102.   CORONARIES:  The left main had proximal 30% stenosis and distal 50%  stenosis. The LAD had proximal and ostial 30% stenosis. There was a  proximal mid long 30% stenosis.  There was a distal 50% stenosis. The  first diagonal was large with mid 40% stenosis.  The circumflex had an  ostial long 99% stenosis in the previously stented area.  There was a  proximal 30% stenosis after the stent.  There were mid stents traveling  into the marginal.  These had diffuse 40% InStent restenosis.  The  right  coronary artery was a dominant somewhat narrow caliber vessel.  There  were diffuse 25 and 30% lesions in the mid and proximal segment.  The  PDA was moderate-sized.  It was a long proximal 60% stenosis.   LEFT VENTRICULOGRAM:  The left ventriculogram was obtained in the RAO  projection.  The EF was about 50% with moderate lateral akinesis.   CONCLUSION:  The patient has recurrent stenosis with the proximal  circumflex stenosis being critically diseased.  She is currently having  unstable symptoms status post this cath.  She will be admitted to the  hospital and will review her films for a plans of percutaneous  revascularization.      [name], MD, [facility]  Electronically Signed     JH/MEDQ  D:  12/23/2006  T:  12/24/2006  Job:  [id number]   cc:   [name], M.D.

## 2010-09-16 NOTE — Discharge Summary (Signed)
NAME:  [name], [name] NO.:  [id number]   MEDICAL RECORD NO.:  [id number]          PATIENT TYPE:  INP   LOCATION:  5017                         FACILITY:  [facility]   PHYSICIAN:  Monte Fantasia, MD  DATE OF BIRTH:  22-Mar-1928   DATE OF ADMISSION:  10/11/2008  DATE OF DISCHARGE:  10/15/2008                               DISCHARGE SUMMARY   PRIMARY CARE PHYSICIAN:  Kristina R. Rankins, MD   CONSULTS:  1. GI with Jordan Hawks. Elnoria Howard, MD  2. [facility] with Luis Abed, MD, [place]   DISCHARGE DIAGNOSES:  1. Gastrointestinal bleed.  2. Diverticular bleed.  3. Symptomatic anemia due to acute blood loss.  4. Hypertension.  5. Coronary artery disease, status post coronary artery bypass graft      with stent.  6. Hypertension.  7. Dyslipidemia.  8. Diabetes type 2.  9. Congestive heart failure with diastolic dysfunction.   DISCHARGE MEDICATIONS:  1. KCl, potassium chloride 40 mEq p.o. daily.  2. Cozaar 50 mg p.o. daily.  3. Simvastatin 40 mg p.o. nightly.  4. Levemir 25 units subcutaneous q.a.m.  5. Coreg 12.5 mg p.o. twice daily.  6. Advair Diskus 250/50 inhaled b.i.d.  7. Lasix 40 mg p.o. twice daily.  8. Ferrous sulfate 325 mg p.o. b.i.d.   COURSE DURING THE HOSPITAL STAY:  [name] is a 77 year old pleasant  African American lady.  The patient was admitted on October 12, 2008, with  complaints of bright red blood in her stools.  The patient described of  having clots in her stools and hence reported it to the primary care  physician, who recommended her to be admitted, and the patient was  admitted on October 12, 2008.  The patient had a CT scan of the abdomen and  pelvis with contrast.  CT abdomen showed no acute upper abdominal  abnormalities.  CT pelvis showed sigmoid and distal descending colonic  diverticulosis without evidence of diverticulitis.  No other intrapelvic  abnormalities were noted.  The patient had been on aspirin and Plavix in  view  of her significant cardiac history, status post bypass and stents,  which was held in view of her diverticular bleed.  The patient received  2 units of PRBC transfusion, initially was n.p.o. and once bleeding  controlled, was started on clear liquids and advance diet as tolerated.  The patient also was evaluated by Cardiology in view of her holding  aspirin and Plavix, and agreed with holding aspirin and Plavix in view  of GI bleed.  However, the patient would need to follow up with [facility]  [facility] within next 1 week for possibly starting her aspirin back  again.  Next, also the patient was evaluated by Dr. Jeani Hawking for her  diverticular bleed and conservative line of management was followed.  The patient improved well through the stay and did not have any episodes  of any active bleeding.  As per recommendations of Dr. Elnoria Howard would  recommended to have an outpatient colonoscopy for the same.  The patient  has been counseled for the same and has been recommended  to follow with  Dr. Elnoria Howard.   RADIOLOGICAL INVESTIGATIONS DONE DURING THE STAY IN THE HOSPITAL:  1. CT abdomen with contrast.  Impression:  No acute upper abdominal      findings.  2. CT pelvis.  Impression:  Sigmoid and distal descending colonic      diverticulosis without any evidence of diverticulitis.  No      intrapelvic abnormalities.  3. X-ray chest done on October 13, 2008.  Impression:  No acute disease,      prior CABG, low lung volumes.   LABORATORIES DONE DURING THE STAY IN THE HOSPITAL:  Hemoglobin 10.6,  hematocrit 30.7, total WBC 4.3, and platelet count of 103.  PT 15.5, INR  1.2, and PTT 36.  Sodium 138, potassium 4.2, chloride 102, bicarb 29,  glucose 127, BUN 32, creatinine 1.37, and calcium of 8.7.  Cardiac  enzymes done on admission was negative.  Hemoglobin A1c is 5.2.   DISPOSITION:  The patient is medically stable to be discharged home  today.  The patient has had no episode of any blood in her stools  in the  past 24 hours.  H and H has been maintained stable, status post 2 units  of transfusion.  The patient is asymptomatic for her bleed.  As per the  GI recommendation, the patient needs to have an outpatient colonoscopy.  Also, the patient has been counseled to follow up with her [facility]  Cardiology for possibly restarting her on aspirin and monitoring her for  GI bleed.   Total time for dictation of 40 minutes, which includes 10 minutes of the  patient counseling.      Monte Fantasia, MD  Electronically Signed     MP/MEDQ  D:  10/15/2008  T:  10/16/2008  Job:  [id number]   cc:   [facility]  Jordan Hawks. Elnoria Howard, MD  Fanny Dance. Richardson, M.D.

## 2010-09-16 NOTE — H&P (Signed)
NAME:  [name] NO.:  [id number]   MEDICAL RECORD NO.:  [id number]           PATIENT TYPE:   LOCATION:  3316                         FACILITY:  [facility]   PHYSICIAN:  Jesse Sans. Wall, MD, FACCDATE OF BIRTH:  12-Nov-1927   DATE OF ADMISSION:  03/15/2007  DATE OF DISCHARGE:                              HISTORY & PHYSICAL   HISTORY:  [name] is a 77 year old African American female  who has known coronary artery disease.   She presents with substernal chest tightness and pressure identical to  her previous angina.  This occurred at rest last evening.   She had a non-ST segment elevation MI in April of 2008, with subsequent  stenting x2 with a drug-eluting stent of the circumflex.  She presented  again with  her current symptoms in August of 2008 with restenosis, with  restenting of the circumflex by Dr. Juanda Chance.  She had to take two or  three nitroglycerin last evening to relieve the pain.   PAST MEDICAL HISTORY:  SHE HAS NO KNOWN DRUG ALLERGIES.   CURRENT MEDICATIONS:  1. Advair inhaler 250/50 b.i.d.  2. Lasix 20 mg a day.  3. Lisinopril 20 mg a day.  4. Triamterene/hydrochlorothiazide 37.5/25 mg every day.  5. Aspirin 81 mg a day.  6. Plavix 75 mg a day.  7. Coreg 25 mg b.i.d.  8. Pravastatin 80 mg nightly.  9. Levemir 32 units in the morning.  10.Nitroglycerin p.r.n.   1. She has a history of diastolic congestive heart failure, EF of 50%.  2. History of hypertension.  3. Type 2 diabetes.  4. Dyslipidemia.  5. Obesity.  6. Asthma.  7. Chronic renal insufficiency.  8. Hyponatremia.   SUBSTANCE HISTORY:  She quite smoking 40 years ago.  She does not drink.  She does not use any recreational products.   SOCIAL HISTORY:  She is widowed.  She is on Medicaid.  Lives alone.  Family is nearby in [place].   FAMILY HISTORY:  Is not significant for any premature coronary disease.   REVIEW OF SYSTEMS:  Other than the HPI, is remarkable for  dyspnea on  exertion, weakness and numbness.  The rest of the review of systems is  noncontributory.   PHYSICAL EXAMINATION:  VITAL SIGNS:  Her blood pressure is 110/58.  Her  pulse is 73 and regular.  EKG shows normal sinus rhythm with no acute  changes from previous ECGs.  Her temp is 97.3.  respiratory rate is 16,  unlabored.  OT sat is 95% on room air.  GENERAL:  She is in no acute distress.  She is alert and oriented x3.  HEENT:  She has slight nasocoria.  Extraocular movements are intact.  Sclerae slightly muddy.  Facial symmetry is normal.  Teeth are upper and  lower dentures.  NECK:  Supple.  No enlargement.  Carotid upstrokes are equal  bilaterally, without obvious bruits.  Thyroid is not enlarged.  Trachea  is midline.  LUNGS:  The lungs were clear to auscultation.  HEART:  Reveals distant heart sounds.  PMI is poorly appreciated.  She  has regular rate and rhythm.  ABDOMEN:  The abdominal exam is soft.  Good bowel sounds.  Nontender.  Organomegaly is difficult to assess.  EXTREMITIES:  Reveal 2+ pitting edema bilaterally.  Pulses are intact.  GENITOURINARY:  Without CVA tenderness.  SKIN:  Dry.  NEUROLOGICAL:  Nonfocal, except for the nasocoria.  She has good facial  expression, and there is no obvious sign of ptosis or weakness in the  periorbital area.   LABORATORY DATA:  Creatinine is 1.4, BUN is 42, sugar is 159, potassium  4.8, hemoglobin 10.6, BMP is 87, point of care markers are negative x1,  chest x-ray shows parabronchial thickening, stable cardiomegaly.   ASSESSMENT:  1. Unstable angina with symptoms identical to her first two      presentations.  She has had drug-eluting stents to the circumflex,      with recurrent restenosis in August of 2008, requiring restenting.  2. Diastolic dysfunction.  3. Mildly reduced left ventricular systolic function.  4. Hypertension.  5. Type 2 diabetes.  6. Hyperlipidemia.  7. Obesity.  8. Asthma.  9. Chronic renal  insufficiency.  10.Anemia.   PLAN:  1. Admit to Telemetry.  2. Intravenous nitroglycerin.  3. Unfractionated heparin.  4. Continue other medications.  5. Hydrate for increased risk for contrast nephropathy.  6. Diagnostic cath with coronaries only, to decrease download.   I have discussed this with the patient.  She agrees to proceed with  hospitalization and catheterization.      Thomas C. Daleen Squibb, MD, [facility]  Electronically Signed     TCW/MEDQ  D:  03/15/2007  T:  03/16/2007  Job:  [id number]   cc:   Everardo Beals. Juanda Chance, MD, [facility]  Dr. Ihor Gully  Dr. Barbaraann Barthel

## 2010-09-16 NOTE — Op Note (Signed)
NAME:  [name], [name]              ACCOUNT NO.:  [id number]   MEDICAL RECORD NO.:  [id number]          PATIENT TYPE:  AMB   LOCATION:  SDS                          FACILITY:  [facility]   PHYSICIAN:  Alford Highland. Rankin, M.D.   DATE OF BIRTH:  Oct 11, 1927   DATE OF PROCEDURE:  DATE OF DISCHARGE:  10/17/2007                               OPERATIVE REPORT   PREOPERATIVE DIAGNOSIS:  Nucleosclerotic cataract, left eye.   POSTOPERATIVE DIAGNOSIS:  Nucleosclerotic cataract, left eye.   PROCEDURE:  Phacoemulsification and extracapsular cataract extraction  left eye with intraocular lens placement.   SURGEON:  Alford Highland. Rankin, MD.   ANESTHESIA:  Local pharmacy control.   INDICATION FOR PROCEDURE:  The patient is a 77 year old woman who has  impairment in activities of daily living in the left eye in the basis of  dense nuclerosclerotic cataract.  She wants [place] to attempt to remove the  opacity and place a clear lens implant.  She understands the risk  including the rare occurrence of death, loss of the eye including, but  not limited to hemorrhage, infection, scarring, need for another  surgery, no change in vision, loss of vision, or progressive disease  despite intervention.  After appropriate signed consent was obtained.  The patient was taken to the operating room.  In the operating room,  appropriate monitors followed by 2% Xylocaine was injected, 5 mL of  retrobulbar to the left eye with additional of 5 mL laterally in the  modified [employer]  The left periocular region was sterilely prepped and  draped in the usual sterile fashion.  Lid speculum was applied.  Clear  corneal incision was then made with a 2.7 x 11-blade knife.  Anterior  chamber was deep with Viscoat.  Capsulorrhexis was fashioned with a  cystotome needle.  At this time, phacoemulsification with excellent  hydrodissection.  Delineation was carried out initially to Viscoat to  hydrodissection with the BSS for hydrodelineation.   At this time, BSS  was used to hydrate and delineate the nuclear region.   At this time, phacoemulsification was then started off.  A 15-degree  Superblade was then used to create a separate paracentesis of the  nucleus rotator.  Phacoemulsification was then carried down.  A groove Y-  incision was made and then the fragments were cracked, so that the apex  of the Y could be removed separately.  This allowed for excellent  rotation of the nucleus.  The nucleus was then rotated and removed in  segments.  No complications occurred.  Small remnants of very adherent  cortex were noted.  This could not be removed without significant  stretch on the posterior capsule.  To avoid posterior capsule rupture,  thin portions of cortical material were left peripherally.   At this time, the capsular bag was with Viscoat.  The intraocular lens  was inserted.  The lens was now coned [employer] IQ, which power was +24.5,  model was [id number].  This was placed into the capsular bag without  difficulty and over to the horizontal position with excellent  centration.   At  this time, a safety limbal stitch was placed and through the clear  cornea, the knot was rotated to bury.  Subconjunctival Decadron was  applied.  Sterile patch and Fox shield applied.  The patient was taken  to the short-stay area and discharged home as an outpatient.      Alford Highland Rankin, M.D.  Electronically Signed     GAR/MEDQ  D:  10/17/2007  T:  10/18/2007  Job:  [id number]

## 2010-09-16 NOTE — Assessment & Plan Note (Signed)
[facility]                            CARDIOLOGY OFFICE NOTE   NARELY, NOBLES                     MRN:          [id number]  DATE:06/23/2007                            DOB:          October 09, 1927    PRIMARY:  Dr. Beverley Fiedler.   REASON FOR PRESENTATION:  Evaluate the patient for coronary disease  status post CABG.   HISTORY OF PRESENT ILLNESS:  The patient is 77 years old.  She returns  for follow-up.  She does not think she is doing particularly well.  She  has not lost any weight.  Blood pressure is up.  When she lies down, she  feels kind of a pins and needles discomfort in her chest.  She is  walking daily and doing [employer] exercises.  This is about 1/2  hour total 5 days a week.  With this she does not get any of the chest  discomfort that she had previously.  She does get short of breath with  moderate activity but is not having any resting shortness breath.  Denies any PND or orthopnea.  She had some increasing lower extremity  swelling but she keeps her feet down quite a bit during the day.  She  has taken her medications.  She did not want to stay in cardiac rehab  because she did not have her Medicaid card yet.  She is sounds like she  has been participating in diet, but I am unclear about how much.   PAST MEDICAL HISTORY:  Coronary artery disease status post CABG (LIMA to  the LAD, SVG to distal circumflex, SVG to right coronary artery), mildly  reduced ejection fraction (45-50%), hypertension, morbid obesity,  diabetes, dyslipidemia, asthma, renal insufficiency, hyponatremia,  diastolic heart failure.   MEDICATIONS:  Lisinopril 5 mg daily, potassium 20 mg daily, Coreg 12.5  mg b.i.d., Vytorin 10/20 daily, furosemide 40 mg daily, insulin,  Levemir, Plavix 75 mg daily, aspirin 81 mg daily, Advair 250/50, Whole  Source Dietary, Caltrate 600 mg daily.   REVIEW OF SYSTEMS:  As stated in the HPI, otherwise other  systems.   PHYSICAL EXAMINATION:  The patient is in no acute distress.  Blood pressure 187/88, heart rate to 87 and regular.  HEENT:  Eyelids unremarkable.  Pupils are equal, round, and reactive to  light and accommodation.  Fundi are not visualized.  Oral mucosa  unremarkable.  NECK:  No jugular venous distension 45 degrees, carotid upstroke brisk  and symmetric, no bruits, thyromegaly.  LYMPHATICS:  No cervical, axillary, or inguinal adenopathy.  LUNGS:  Clear to auscultation bilaterally.  BACK:  No costovertebral angle tenderness.  CHEST:  Unremarkable.  HEART:  PMI not displaced or sustained, S1 and S2 within normal limits,  no S3, no S4, no clicks, rubs, murmurs.  ABDOMEN:  Morbidly obese, positive bowel sounds, normal in frequency and  pitch, no bruits, rebound, guarding.  No midline pulsatile mass,  hepatomegaly, splenomegaly.  SKIN:  No rashes, no nodules.  EXTREMITIES:  With 2+ pulses right greater than left mild bilateral  lower extremity edema, cyanosis or clubbing.  NEURO:  Grossly intact throughout.   ASSESSMENT/PLAN:  1. Coronary disease.  Patient is making a slow recovery now from      surgery.  However, she has no acute problems and no further      cardiovascular testing is suggested.  She needs secondary risk      reduction.  2. Obesity.  I think this is a very significant part of the patient's      multiple medical problems.  We discussed this and she knows in no      uncertain terms that she will not feel well until she loses weight.  3. Hypertension.  She is reluctant to take any further medication but      I have convinced any increase lisinopril 10 mg daily.  I will get      BMET in 2 weeks.  Because of her dyspnea also check a BNP.  4. Lower extremity swelling.  She is to keep her feet elevated, which      she has not done.  She is not wearing compression stockings.  She      needs to lose weight as well and watch her salt.  She will remain      on the  current dose of Lasix.  5. Follow-up.  Will see her back in 2 months or sooner if needed.[name], MD, [facility]  Electronically Signed    JH/MedQ  DD: 06/23/2007  DT: 06/24/2007  Job #: [id number]   cc:   [name], M.D.

## 2010-09-16 NOTE — Assessment & Plan Note (Signed)
[facility]                            CARDIOLOGY OFFICE NOTE   [name], [name]                     MRN:          [id number]  DATE:02/10/2008                            DOB:          02-26-1928    PRIMARY CARE PHYSICIAN:  [place] R. Rankins, MD   REASON FOR PRESENTATION:  The patient with hypertension, coronary artery  disease, status post coronary artery bypass graft.   HISTORY OF PRESENT ILLNESS:  The patient is 77 years old.  She presents  for followup of the above.  I have been receiving notes from cardiac  rehab reporting her blood pressures which have been consistently going  up and usually fairly elevated even when she first arrives at rehab.  She has seen Dr. Barbaraann Barthel.  She has had some recent blood work.  She has  not had any acute cardiac complaints.  She is doing some walking.  She  is not getting any chest pressure, neck or arm discomfort.  She is  having no new palpitation, presyncope, or syncope.  She is having no PND  or orthopnea.   PAST MEDICAL HISTORY:  1. Coronary artery disease, status post CABG (LIMA to the LAD, SVG to      distal circumflex, and SVG to right coronary artery in November      2008).  2. Mild LV dysfunction (EF 40%-50%).  3. Hypertension.  4. Morbid obesity.  5. Diabetes mellitus.  6. Dyslipidemia.  7. Asthma.  8. Renal insufficiency.  9. Hyponatremia.  10.Diastolic heart failure.   ALLERGIES AND INTOLERANCES:  None.   MEDICATIONS:  1. Cozaar 50 mg daily.  2. Vytorin 10/20 daily.  3. Coreg 12.5 mg b.i.d.  4. Multivitamin.  5. Plavix 75 mg daily.  6. Aspirin 81 mg daily.  7. Advair 250/50 b.i.d.  8. Levemir.  9. Caltrate.  10.Furosemide 40 mg b.i.d.  11.Potassium 20 mEq daily.   REVIEW OF SYSTEMS:  As stated in HPI, otherwise negative for other  systems.   PHYSICAL EXAMINATION:  GENERAL:  The patient is in no distress.  VITAL SIGNS:  Blood pressure 139/68, heart rate 70 and regular,  weight  225 pounds.  HEENT:  Eyelids are unremarkable.  Pupils are equal, round, and reactive  to light.  Fundi not visualized.  Oral mucosa unremarkable.  NECK:  No jugular vein distention, 45 degrees.  Carotid upstrokes brisk  and symmetrical.  No bruits.  No thyromegaly.  LYMPHATICS:  No cervical, axillary, or inguinal.  LUNGS:  Clear to auscultation bilaterally.  CHEST:  A well-healed sternotomy scar.  HEART:  PMI not displaced or sustained, S1 and S2 within normal limits.  No S3.  No S4.  No clicks.  No rubs.  No murmurs.  ABDOMEN:  Obese, positive bowel sounds, normal in frequency and pitch.  No bruits, rebound, guarding, or midline pulsatile mass.  No hepatomegaly.  No splenomegaly.  SKIN:  No rashes.  EXTREMITIES:  A 2+ pulses upper, 2+ femorals with right femoral bruit,  absent dorsalis pedis and posterior tibialis bilaterally.  Mild  bilateral extremity edema.  NEUROLOGIC:  Oriented  to person, place, and time.  Cranial nerves II  through XII grossly intact.  Motor grossly intact.   EKG, sinus rhythm, right bundle-branch block, no acute ST wave changes.   ASSESSMENT/PLAN:  1. Coronary artery disease.  The patient is having no new symptoms.      She will continue with aggressive risk reduction.  2. Hypertension.  Blood pressures elevated and I will increase her      Cozaar to 50 mg twice a day.  We could also titrate up her beta-      blocker over time.  3. Mildly reduced ejection fraction.  We will manage this in the      context of treating her blood pressure.  4. Renal insufficiency.  We will check a BMET in 2 weeks as we have      made another change on her ARB.  Her creatinine was stable at the      last lab draw.  5. Obesity.  We will again talk to her about the need to lose weight      with diet and exercise.  She just does not understand why she is      not losing weight.  6. Diabetes, per Dr. Barbaraann Barthel.  7. Dyslipidemia.  Her lipid profile was reasonable.  Her HDL  was still      slightly below the 40 target.  This would go up with exercise.  8. Followup.  I will see her back in 6 months or sooner if needed.     [name], MD, [facility]  Electronically Signed   JH/MedQ  DD: 02/10/2008  DT: 02/10/2008  Job #: [id number]   cc:   [name], M.D.

## 2010-09-16 NOTE — Assessment & Plan Note (Signed)
[facility]                            CARDIOLOGY OFFICE NOTE   [name], [place]                     MRN:          [id number]  DATE:09/22/2007                            DOB:          1927-05-10    PRIMARY CARE PHYSICIAN:  Turkey R. Rankins, M.D.   REASON FOR PRESENTATION:  Evaluate patient with coronary disease status  post CABG.   HISTORY OF PRESENT ILLNESS:  The patient is 77 years old.  She presents  for follow-up of the above.  She is trying to do walking every day.  She  has to walk 15 minutes to the bus back and forth which she does daily.  She is not having any new chest discomfort with this.  She is fatigued.  She is having continued swelling in her legs but this perhaps is  slightly better.  She has had no new shortness of breath though she gets  dyspnea with moderate exertion.  She is not having any resting shortness  breath and does not describe any PND or orthopnea.  She is not having  palpitations, presyncope or syncope.  She has pin-like discomfort in her  right ankle.   PAST MEDICAL HISTORY:  1. Coronary artery disease status post CABG (LIMA to the LAD, SVG to      distal circumflex, SVG to right coronary artery).  2. Mildly reduced ejection fraction (40-50%).  3. Hypertension.  4. Morbid obesity.  5. Diabetes mellitus.  6. Dyslipidemia.  7. Asthma.  8. Renal insufficiency.  9. Hyponatremia.  10.Diastolic heart failure.   ALLERGIES AND INTOLERANCES:  None.   MEDICATIONS:  1. Vytorin 10/20.  2. Coreg 12.5 mg b.i.d.  3. Multivitamin.  4. Plavix 75 mg daily.  5. Aspirin 81 mg daily.  6. Advair 250/50 b.i.d.  7. Levemir Flexpen.  8. Caltrate.  9. Furosemide 40 mg b.i.d.  10.Lisinopril 20 mg daily.  11.Potassium 20 mEq daily.   REVIEW OF SYSTEMS:  As stated in HPI, otherwise negative for other  systems.   PHYSICAL EXAMINATION:  GENERAL APPEARANCE:  The patient is in no  distress.  VITAL SIGNS:  Blood pressure  170/80, heart rate 74 and regular, weight  222 pounds, body mass index 40.  HEENT:  Eye lids unremarkable.  Pupils equal, round and reactive to  light.  Fundi not visualized.  Oral mucosa unremarkable.  NECK:  No jugular venous distension at 45 degrees.  Carotid upstroke  brisk and symmetrical.  No bruits, no thyromegaly.  LYMPHATICS:  No cervical, axillary or inguinal adenopathy.  LUNGS:  Clear to auscultation bilaterally.  BACK:  No costovertebral angle tenderness.  CHEST:  Well-healed sternotomy scar.  HEART:  PMI not displaced or sustained, S1 and S2 within normal.  No S3,  no S4, no clicks, rubs, or murmurs.  ABDOMEN:  Obese, positive bowel sounds normal in frequency and pitch, no  bruits, rebound, guarding or midline pulsatile mass.  No hepatomegaly,  no splenomegaly.  SKIN:  No rashes, no nodules.  EXTREMITIES:  She has 2+ pulses, mild bilateral ankle edema, no cyanosis  or clubbing.  Right  saphenous vein graft harvest site intact.  NEURO:  Oriented to person, place and time.  Cranial nerves II-XII  grossly intact, motor grossly intact.   ASSESSMENT/PLAN:  1. Coronary disease.  Patient is having no symptoms related to this.      No further cardiovascular testing is suggested.  She will continue      with risk reduction, though I am not particular sure that she is      participating aggressively in this.  2. Hypertension.  Blood pressure is still not well controlled.  I am      going to increase her lisinopril to 40 mg daily.  She will need to      get a BMET in two weeks.  She will otherwise continue the medicines      as listed.  3. Obesity.  We have discussed the need to diet and exercise.  4. Dyslipidemia per Dr. Barbaraann Barthel.  The goal is an LDL less than 70 and      an HDL greater than 50.  5. Diabetes per Dr. Barbaraann Barthel.  6. Follow-up will her see back in about three months or sooner if      needed.     [name], MD, [facility]  Electronically Signed    JH/MedQ  DD:  09/22/2007  DT: 09/22/2007  Job #: [id number]   cc:   [name], M.D.

## 2010-09-16 NOTE — Consult Note (Signed)
NAME:  [name], WOJDYLA NO.:  [id number]   MEDICAL RECORD NO.:  [id number]          PATIENT TYPE:  INP   LOCATION:  5017                         FACILITY:  [facility]   PHYSICIAN:  Luis Abed, MD, FACCDATE OF BIRTH:  1928/03/22   DATE OF CONSULTATION:  DATE OF DISCHARGE:                                 CONSULTATION   REASON FOR CONSULTATION:  Aminta Sakurai is admitted with a GI bleed.  She is stable at the moment.  She is followed by Dr. Angelina Sheriff for  her cardiology problems.  We have been called and asked to consult  concerning the use of aspirin and Plavix.   HISTORY OF PRESENT ILLNESS:  The patient has known coronary disease.  She describes having had stents in the past but I do not have this data.  After that her last cardiac intervention was coronary artery bypass  grafting in November 2008.  She has done well since then.  She has not  had chest pain.  Historically, she had an ejection fraction in the 40-  50% range.  However, she had an echo in May 2010 with an ejection  fraction of 60%.  There is a history of some diastolic heart failure in  the past.  The patient saw Dr. Antoine Poche on Sep 13, 2008.  Her cardiac  status was stable.  She did have carotid Dopplers then showing a 60-79%  right internal carotid artery stenosis and 40-59% left internal carotid  artery stenosis.  She also has pulmonary hypertension.  The patient is  on aspirin and Plavix.  I suspect that the Plavix has just been  continued over many years because she has been stable.  At this time she  does not have an absolute indication for Plavix.  Certainly she has an  indication for aspirin.   ALLERGIES:  No known drug allergies.  She is suppose to avoid NSAIDS.   MEDICATIONS:  1. Leviner.  2. Plavix 75.  3. Bayer Aspirin.  4. Advair Diskus.  5. Caltrate.  6. Coreg 12.5.  7. Lasix 40 b.i.d.  8. KCl 20 daily.  9. Cozaar 50 b.i.d.  10.Simvastatin 40.   OTHER MEDICAL PROBLEMS:   See the complete list below.   SOCIAL HISTORY:  The patient quit smoking 40 years ago.  She denies  alcohol.   FAMILY HISTORY:  There is a family history of heart disease.   REVIEW OF SYSTEMS:  The patient has denies any fevers or chills.  She is  not having any headaches.  There is no change in her vision or her  hearing.  She has no chest pain.  There is no shortness of breath.  There is no cough.  She is not having any GU symptoms.  She does not  have any musculoskeletal problems.  She does have some peripheral edema.  All other systems are reviewed and are negative.   PHYSICAL EXAMINATION:  VITAL SIGNS:  Her pressure has ranged from 120-  160 systolic/60.  Heart rate is 63.  Temperature is 97.7.  GENERAL:  The patient is oriented to person,  time and place.  Affect is  normal.  HEENT:  Reveals no xanthelasma.  She has normal extraocular motion.  NECK: There are no obvious carotid bruits heard.  There is no jugular  venous distention.  LUNGS:  Lungs are clear.  Respiratory effort is not labored.  CARDIAC:  Exam reveals S1-S2.  There are no clicks or significant  murmurs.  ABDOMEN:  The abdomen is soft.  EXTREMITIES:  She does have 1+ peripheral edema.   LABORATORY DATA:  Her laboratory studies have shown that her hemoglobin  is 9.1 today.  BUN is 41 with a creatinine of 1.3.  TSH is 1.5.  I do  not see chest x-ray data at this time.  Also there is no EKG at this  time for review.   PROBLEM LIST:  1. Current GI bleed.  The patient is being treated and stabilizing.  2. Hypertension. Meds to be continued and then her pressure followed.  3. Diabetes.  4. Coronary artery disease post CABG in November 2008.  She is stable.  5. History of some LV dysfunction, although recent echo showed an      ejection fraction of 60%.  6. Obesity.  7. Hyperlipidemia.  8. History of asthma.  9. History of some mild renal insufficiency.  10.History of diastolic heart failure in the past.  This  is important      as we need to watch her volume status very carefully.  11.Carotid artery disease.  Recent Doppler reveals 60-79% right      internal carotid artery stenosis and 40-59% left internal carotid      artery stenosis.  12.History of moderate pulmonary hypertension.   ASSESSMENT AND PLAN:  At this point the patient is stable and her GI  bleed is being watched carefully.  It certainly is okay to hold Plavix.  It is okay to hold aspirin if you must.  I would try to restart aspirin  as soon as reasonably possible.  She needs his for her coronary disease  and her carotid disease.  It is important to be careful with volume.  She has had some diastolic CHF in the past.  She has some mild edema  now.  I would plan to decrease her IV fluid if possible and watch her  I's and O's and volume status carefully.   We will follow her with you.      Luis Abed, MD, [facility]  Electronically Signed     JDK/MEDQ  D:  10/12/2008  T:  10/12/2008  Job:  [id number]   cc:   [name], MD, [facility]

## 2010-09-16 NOTE — Op Note (Signed)
NAME:  [name], [name] NO.:  [id number]   MEDICAL RECORD NO.:  [id number]          PATIENT TYPE:  INP   LOCATION:  2304                         FACILITY:  [facility]   PHYSICIAN:  Sheliah Plane, MD    DATE OF BIRTH:  03-May-1928   DATE OF PROCEDURE:  03/18/2007  DATE OF DISCHARGE:                               OPERATIVE REPORT   PREOPERATIVE DIAGNOSIS:  Coronary occlusive disease with unstable angina  status post re-stenosis of stent grafts x2.   POSTOPERATIVE DIAGNOSIS:  Coronary occlusive disease with unstable  angina status post re-stenosis of stent grafts x2.   SURGICAL PROCEDURE:  Urgent coronary artery bypass grafting x3 with the  left internal mammary to the left anterior descending coronary artery,  reversed saphenous vein graft to the distal circumflex, reversed  saphenous vein graft to the distal right coronary artery with right  endovein harvesting.   SURGEON:  Gwenith Daily. Tyrone Sage, M.D.   FIRST ASSISTANT:  Zadie Rhine, P.A.-C.   BRIEF HISTORY:  The patient is a 77 year old female who, over the past  eight months, has had two previous interventions, once with three stents  placed in the proximal circumflex and subsequent re-stenosis with re-  dilation with cutting balloon in August 2008.  The patient returns with  severe unstable anginal symptoms with chest pain at rest on heparin and  nitroglycerin and has been on Plavix.  She underwent cardiac  catheterization.  Because of the 99% ostial circumflex lesion, three  vessel coronary artery disease, and ongoing chest pain, it was decided  to proceed more urgently with coronary artery bypass grafting and not  wait in spite of the increased risk of bleeding because of the Plavix.  The patient agreed and signed informed consent.   DESCRIPTION OF PROCEDURE:  With Swan-Ganz and arterial lines in place,  the patient underwent general endotracheal anesthesia without incident.  The skin of the chest and legs  were prepped with Betadine and draped in  the usual sterile manner.  Using the Guidant endovein harvesting system,  vein was harvested from the right thigh and was of adequate quality and  caliber.  A median sternotomy was performed, the left internal mammary  artery was dissected down as a pedicle graft.  The distal artery was  divided, had good free flow.  The pericardium was opened.  Overall  ventricular function appeared preserved.  The patient was systemically  heparinized.  The ascending aorta and right atrium were cannulated and  aortic root vent cardioplegia needle was introduced into the ascending  aorta.  The patient was placed on cardiopulmonary bypass at 2.4 liters  per minute per meter squared.  The sites for anastomosis were dissected  out of the epicardium.  The patient's body temperature was cooled to 30  degrees.  The aortic Cross clamp was applied.  500 mL of cold blood  potassium cardioplegia was administered with rapid diastolic arrest of  the heart.  The myocardial septal temperature was monitored throughout  the crossclamp period.   Attention was turned first to the distal circumflex coronary artery  which was a relatively large  vessel.  It was opened and admitted a 1.5  mm probe.  Using a running 7-0 Prolene, distal anastomosis was  performed.  Additional cold blood cardioplegia was administered down the  vein graft.  Attention was then turned to the distal right coronary  artery which was opened was slightly small, but admitted a 1.5 mm probe.  Using a running 7-0 Prolene, distal anastomosis was performed.  The left  anterior descending coronary artery was opened and was a very diffusely  diseased vessel, opened at the mid portion.  Using a running 8-0  Prolene, the left internal mammary artery was anastomosed to the left  anterior descending coronary artery.  With release of the bulldog, there  was a rise in the myocardial septal temperature.  The bulldog was  placed  back on the mammary artery.  With the Cross clamp still in place, two  punch aortotomies were performed, each of the two vein grafts were  anastomosed to the ascending aorta.  The air was evacuated from the  grafts.  The aortic Cross clamp was removed.  Total Cross clamp time 58  minutes.   The patient was converted to a sinus rhythm.  The sites of anastomoses  were inspected and were free of bleeding.  She was then ventilated and  weaned from cardiopulmonary bypass without difficulty.  She remained  hemodynamically stable.  She was decannulated in the usual fashion.  Protamine sulfate was administered.  With the operative field  hemostatic, two atrial and two ventricular pacing wires were applied.  Graft markers were applied.  A left pleural tube and a Blake drain were  left in place.  The pericardium was reapproximated and the sternum was  closed with #6 stainless steel wire, fascia was closed with interrupted  0 Vicryl, running 3-0 Vicryl in the subcutaneous tissue, and 4-0  subcuticular stitch in the skin edges.  Dry dressings were applied.  Sponge and needle counts were reported as correct at the completion of  the procedure with the exception of a 7-0 needle that was lost on the  leg portion of the incision.  The patient was transferred to the  surgical intensive care unit for further postoperative care.      Sheliah Plane, MD  Electronically Signed     EG/MEDQ  D:  03/18/2007  T:  03/18/2007  Job:  [id number]   cc:   [name], MD, [facility]  [name], M.D.

## 2010-09-16 NOTE — Discharge Summary (Signed)
NAME:  [name], [name] NO.:  [id number]   MEDICAL RECORD NO.:  [id number]          PATIENT TYPE:  OBV   LOCATION:  2038                         FACILITY:  [facility]   PHYSICIAN:  Gerrit Friends. Dietrich Pates, MD, FACCDATE OF BIRTH:  August 12, 1927   DATE OF ADMISSION:  10/01/2006  DATE OF DISCHARGE:  10/02/2006                               DISCHARGE SUMMARY   PRIMARY CARDIOLOGIST:  [name], MD, [facility].   PRIMARY CARE Collen Vincent:  Turkey R. Rankins, M.D.   DISCHARGE DIAGNOSIS:  Atypical left shoulder pain.   SECONDARY DIAGNOSES:  1. Blurring of vision.  2. Coronary artery disease, status post non-ST-elevation myocardial      infarction, August 27, 2006, with percutaneous coronary intervention      and stenting of the left circumflex with two drug-eluting stents.  3. Chronic diastolic congestive heart failure.  4. Stage III chronic kidney disease.  5. History of asthma.  6. Obesity.  7. Hypertension.  8. Hyperlipidemia.  9. Type 2 diabetes mellitus.   ALLERGIES:  NO KNOWN DRUG ALLERGIES.   PROCEDURES:  None.   HISTORY OF PRESENT ILLNESS:  A 77 year old African female with the above  problem list who is recently status post non-ST-elevation MI in April  2008, with drug-eluting stent placement x2 to the left circumflex.  She  has chronic left shoulder discomfort.  She was in her usual state of  health until Oct 01, 2006, when after getting on a city bus, she noted  some blurring of vision and ongoing left shoulder pain which had started  the evening before.  She had mild nausea and shortness of breath.  She  took two sublingual nitroglycerin tablets and all of her symptoms  resolved.  She then presented to the [facility] ED for further  evaluation.  In the ED, her ECG showed no acute ST-T changes and cardiac  markers were negative.   HOSPITAL COURSE:  The patient has ruled out for MI and has had no  recurrent symptoms of either shoulder discomfort or blurring of  vision.  She will be discharged home today in satisfactory condition.  She has  been noted to have a normocytic anemia, and for that reason we will give  her stool cards.  We will follow up a CBC on October 05, 2006.  We have also  arranged for followup carotid ultrasound secondary to her episode of  blurred vision.   DISCHARGE LABORATORY:  Hemoglobin 10.3, hematocrit 30.4, WBC 4.5,  platelets 174, MCV 94.7.  Sodium 139, potassium 4.7, chloride 108, CO2  27, BUN 32, creatinine 1.23, glucose 101.  PT 14.3, INR 1.1, PTT 31.  Total bilirubin 0.6, alkaline phosphatase 83, AST 18, ALT 15, albumin  3.1.  Cardiac enzymes negative x3.  Calcium is 8.7.   DISPOSITION:  The patient is being discharged home today in good  condition.   FOLLOWUP PLANS AND APPOINTMENTS:  1. She will have followup carotid ultrasound on October 05, 2006, in our      office at 9 a.m.  She will also have a CBC on that day.  2. She is  follow up Dr. Antoine Poche on June 20 at 4 p.m.   DISCHARGE MEDICATIONS:  1. Aspirin 325 mg daily.  2. Plavix 75 mg daily.  3. Triamterene/HCTZ 37.5/25 mg daily.  4. Advair 250/50, one puff b.i.d.  5. Lasix 40 mg, a half tablet b.i.d.  6. Lisinopril 10 mg daily.  7. Coreg 12.5 mg, one and a half tablets b.i.d.  8. Levemir Flexpen 32 units daily.  9. Nitroglycerin 0.4 mg sublingual p.r.n. chest pain.   OUTSTANDING LABORATORY STUDIES:  None.   DURATION DISCHARGE ENCOUNTER:  Thirty-two minutes, including physician  time.      Nicolasa Ducking, ANP      Gerrit Friends. Dietrich Pates, MD, [facility]  Electronically Signed    CB/MEDQ  D:  10/02/2006  T:  10/02/2006  Job:  [id number]   cc:   [name], M.D.

## 2010-09-16 NOTE — Assessment & Plan Note (Signed)
[facility]                            CARDIOLOGY OFFICE NOTE   [name]                     MRN:          [id number]  DATE:04/26/2007                            DOB:          May 24, 1927    PRIMARY:  [name].   REASON FOR PRESENTATION:  Evaluate the patient with coronary disease  status post CABG.   HISTORY OF PRESENT ILLNESS:  The patient is 77 years old.  She presented  for recurrent unstable angina.  She has had multiple revascularizations  of her osteal circumflex with stents and in-stent restenosis.  She was  finally sent for CABG.  She had a LIMA to the LAD, SVG to distal  circumflex, SVG to the right coronary artery.   The patient is doing well postoperatively.  She saw Dr. Tyrone Richardson  yesterday and does not have to return to his office.  She is having none  of the chest discomfort that was her previous angina.  She has had some  substernal discomfort related to her previous incision.  She denies any  new shortness of breath.  She has had no palpitations, pre-syncope, or  syncope.  She had no PND or orthopnea.  She has had some lower extremity  swelling.   PAST MEDICAL HISTORY:  Coronary artery disease status post CABG (see  above).  Mildly reduced ejection fraction (45-50%).  Hypertension.  Morbid obesity.  Diabetes.  Dyslipidemia.  Asthma.  Renal insufficiency.  Hyponatremia.  Diastolic heart failure.   ALLERGIES:  None.   MEDICATIONS:  1. Lisinopril 5 mg daily.  2. Potassium 20 mEq daily.  3. Coreg 12.5 mg b.i.d.  4. Vytorin 10/20.  5. Furosemide 40 mg daily.  6. Insulin.  7. Levemir.  8. Plavix 75 mg daily.  9. Aspirin 81 mg daily.  10.Advair 250/50 daily.  11.Caltrate.   REVIEW OF SYSTEMS:  As stated in the HPI and otherwise negative for  other systems.   PHYSICAL EXAMINATION:  The patient is in no distress.  Blood pressure 144/86, heart rate 77 and irregular, weight 222 pounds.  Body mass index  44.  HEENT:  Eyelids unremarkable.  Pupils are equal, round, and reactive to  light and accommodation.  Fundi are not visualized.  Oral mucosa  unremarkable.  NECK:  No jugular venous distension at 45 degrees, carotid upstroke  brisk and symmetric, no bruits, thyromegaly.  LYMPHATICS:  No cervical, axillary, or inguinal adenopathy.  LUNGS:  Clear to auscultation bilaterally.  BACK:  No costovertebral angle tenderness.  CHEST:  Well-healed sternotomy scar without sternal mobility or  drainage.  HEART:  PMI not displaced or sustained, S1 and S2 within normal limits,  no S3, no S4, no clicks, rubs, murmurs.  ABDOMEN:  Morbidly obese, positive bowel sounds, normal in frequency and  pitch, no bruits, rebound, guarding.  No midline pulsatile masses  hepatomegaly, splenomegaly.  SKIN:  No rashes, no nodules.  EXTREMITIES:  With 2+ pulses, right greater than left bilateral lower  extremity moderate edema, no cyanosis, no clubbing.  NEURO:  Oriented to person, place, and time, cranial nerves 2-12  grossly  intact, motor grossly intact.   EKG shows sinus rhythm, rate 77, axis within normal limits, right bundle  branch block, no acute ST-T wave changes.   ASSESSMENT AND PLAN:  1. Coronary disease status post CABG.  She has made a nice recovery      with surgery.  At this point, she will continue with aggressive      risk reduction.  I encouraged cardiac rehab.  She is still trying      to get Medicaid to be able to afford this therapy.  She will      continue with the medications as listed.  2. Obesity.  Again, we discussed the need to lose weight with diet and      exercise.  3. Hypertension.  Blood pressure is slightly elevated today, but it      had been well controlled.  We will follow this with weight loss and      see if she needs further med titration, at which point, we will      probably increase lisinopril.  4. Lower extremity swelling.  She takes the Lasix.  I have given her a       prescription for new knee-high compression stockings to wear.  She      is instructed to keep her feet elevated.  She needs salt and fluid      restriction.  5. Diabetes mellitus per [name].  6. Followup.  I will see her back in about 2 months or sooner if      needed.     Kristina Rotunda, MD, [facility]  Electronically Signed    JH/MedQ  DD: 04/26/2007  DT: 04/26/2007  Job #: [id number]   cc:   Kristina Dance. Rankins, M.D.

## 2010-09-16 NOTE — Assessment & Plan Note (Signed)
[facility]                            CARDIOLOGY OFFICE NOTE   [name], [name]                     MRN:          [id number]  DATE:08/18/2007                            DOB:          11-09-1927    PRIMARY:  Turkey R. Rankins, M.D.   REASON FOR PRESENTATION:  Evaluate patient with coronary disease, status  post CABG.   HISTORY OF PRESENT ILLNESS:  The patient returns for follow-up.  She is  77 years old.  She just has not done well since her bypass.  She had  done cardiac rehab.  She is trying to walk.  However, she feels like her  legs are swollen and heavy.  She feels fatigued, but says she is resting  well.  She does not have any chest pressure, neck or arm discomfort.  She has not had any palpitations, presyncope or syncope.  She is not  describing PND or orthopnea.  She just does not feel like she is able to  walk or do as much she would like.  She thought she should be recovering  more quickly than this.   PAST MEDICAL HISTORY:  Coronary artery disease, status post CABG (LIMA  to the LAD, SVG to distal circumflex, SVG to right coronary artery),  mildly reduced ejection fraction (40-50%), hypertension, morbid obesity,  diabetes, dyslipidemia, asthma, renal insufficiency, hyponatremia,  diastolic heart failure.   ALLERGIES/INTOLERANCES:  NONE.   MEDICATIONS:  1. Vytorin 10/20.  2. Coreg tone 12.5 mg b.i.d.  3. Multivitamin.  4. Lisinopril 10 mg daily.  5. Potassium 20 mEq every other day.  6. Plavix 75 mg daily.  7. Aspirin 81 mg daily.  8. Advair.  9. Levemir.  10.Furosemide 40 mg daily.  11.Caltrate.   REVIEW OF SYSTEMS:  As stated in the HPI, and otherwise negative for  other systems.   PHYSICAL EXAMINATION:  GENERAL: The patient is in no distress.  VITAL SIGNS:  Blood pressure 173/71, heart rate 60 and regular, weight  224 pounds, body mass index 41.  HEENT:  Eyelids unremarkable.  Pupils equal, round, and reactive  to  light.  Fundi not visualized.  NECK:  No jugulovenous distention at 45  degrees.  Carotid upstroke brisk and symmetrical.  No bruits, no  thyromegaly.  LYMPHATICS:  No lymphadenopathy.  LUNGS:  Clear to auscultation bilaterally.  BACK:  No costovertebral angle tenderness.  CHEST:  Unremarkable, except for a well-healed sternotomy scar.  HEART:  PMI not displaced or sustained.  S1-S2 within normal limits.  No  S3-S4.  No clicks, rubs or murmurs.  ABDOMEN:  Obese.  Positive bowel sounds, normal in frequency and pitch.  No bruits, no rebound, no guarding.  No midline pulsatile mass, no  organomegaly.  SKIN:  No rashes.  EXTREMITIES:  2+ pulses.  Right greater than left bilateral lower  extremity edema.  No cyanosis or clubbing.  NEURO:  Grossly intact.   EKG; sinus rhythm, rate 62, right bundle branch block, no acute ST-wave  changes.   ASSESSMENT/PLAN:  1. Fatigue.  The patient really describes leg fatigue.  I tried  to      tease out of her symptoms of sleep apnea because she does snore.      However, she says she is not tired during the day and she feels      rested when she wakes up.  She more is describing a heaviness      trying to walk and move her legs.  There are no motor deficits.      This may be related to the edema.  Therefore, I am going to try to      increase her diuretic to 40 mg twice a day.  She is going to need      to increase the potassium to everyday.  We will need to watch her      creatinine carefully.  We will get a BMET in less than 2 weeks.  2. Hypertension.  Her blood pressure is still elevated.  I am going to      increase her lisinopril to 20 mg daily.  3. Coronary disease.  I do not think she is having any symptoms      related to this.  She will continue with risk reduction.  4. Obesity.  She understands she needs to lose weight with diet and      exercise, and she will continue to work on this.  5. Dyslipidemia.  She will have this followed by  Dr. Barbaraann Barthel.  Goal      will be an LDL less than 70 and an HDL greater than 50.  6. Diabetes.  Her hemoglobin A1c was 6.2, which is well-controlled.      This will be followed by Dr. Barbaraann Barthel.   FOLLOW-UP:  Will see the patient again in about 1 month or sooner if  needed.     [name], MD, [facility]  Electronically Signed    JH/MedQ  DD: 08/18/2007  DT: 08/18/2007  Job #: [id number]   cc:   [name], M.D.

## 2010-09-16 NOTE — Assessment & Plan Note (Signed)
[facility]                            CARDIOLOGY OFFICE NOTE   [name], [name]                     MRN:          [id number]  DATE:09/07/2006                            DOB:          [birth year]    PRIMARY CARDIOLOGIST:  [name], MD, [facility]   PRIMARY CARE PHYSICIAN:  Beverley Fiedler, M.D.   PATIENT PROFILE:  A 77 year old African-American female with recent  admission to [facility] for non-ST-elevation MI who presents  for hospital followup.   PROBLEM LIST:  1. Coronary artery disease.      a.     August 27, 2006, non-ST-elevation myocardial infarction.      b.     August 27, 2006, left heart cardiac catheterization revealing       left main 30%, left anterior descending; 50%, left circumflex 99%       proximal, 70% mid; obtuse marginal 40%, posterior descending       artery 50%; ejection fraction 60% with no wall motion       abnormalities.  The circumflex was stented with a total of 3       Endeavor drug-eluting stents.  2. Hypertension.  3. Hyperlipidemia.  4. Type 2 diabetes mellitus.  5. Obesity.  6. Asthma.  7. Chronic renal insufficiency.  8. Hyponatremia.  9. Chronic diastolic congestive heart failure.   HISTORY OF PRESENT ILLNESS:  A 77 year old African-American female  recently admitted to [facility] from August 27, 2006, to August 31, 2006,  for management of non-ST-elevation MI.  Catheterization at that time  revealed significant stenoses of circumflex which was successfully  treated with Endeavor drug-eluting stents.  She was discharged home on  April 29.  Since discharge, she has been steadily increasing her  activities without any significant chest pain, dyspnea on exertion, or  limitations.  She offers no groin complaints today and says this has  healed well since her catheterization.  She did have one episode of what  she described as indigestion occurring last night in the setting of  feeling emotionally  upset with her grandson.  She says this was not like  her angina and had no associated symptoms.  However, she did take a  nitroglycerin for the discomfort, and symptoms resolved within about 5  minutes. She is not sure if the nitroglycerin helped or if time and  relaxation and removal from the anxiety situation was more helpful.  She  denied any PND, orthopnea, dizziness, syncope, edema, or early satiety.   ALLERGIES:  no known drug allergies.   CURRENT MEDICATIONS:  1. Aspirin 325 daily.  2. Plavix 75 daily.  3. NovoLog pen 28 units daily.  4. Advair 250/50 one puff b.i.d.  5. Vytorin 10/80 daily.  6. Coreg 12.5 b.i.d.  7. Lisinopril 10 mg daily.  8. Lasix 20 mg daily.  9. Triamterene/hydrochlorothiazide 37.5/25 mg daily.  10.Nitroglycerin sublingual p.r.n. chest pain.   PHYSICAL EXAMINATION:  VITAL SIGNS:  Blood pressure 140/72, heart rate  63, respirations 18. Weight 236 pounds, up 5 pounds since her visit in  June 2007.  GENERAL:  Pleasant African-American female in no acute distress.  Awake,  alert, oriented x3.  NECK:  No bruits or JVD.  LUNGS: Respirations unlabored.  Clear to auscultation.  CARDIAC:  Regular S1, S2.  No S3, S4, or murmurs.  ABDOMEN:  Round, soft, nontender, nondistended.  Bowel sounds present  x4.  EXTREMITIES:  Warm and dry without clubbing or cyanosis.  She does have  1-2+ bilateral ankle edema.  Dorsalis pedis and posterior tibial pulses  are 1+ and equal bilaterally.   ACCESSORY CLINICAL FINDINGS:  EKG shows sinus rhythm without any acute  ST or T changes.   ASSESSMENT AND PLAN:  1. Coronary artery disease.  The patient has been doing relatively      well following recent non-ST-elevation myocardial infarction and      percutaneous intervention of the circumflex with 3 drug-eluting      stents place.  She did have an episode of what she describes as      indigestion occurring last night in the setting of emotional upset      that did abate  with nitroglycerin.  She says this symptom was not      like her previous angina and did not have any associated symptoms      whereas angina was associated with shortness of breath and      diaphoresis.  Not entirely clear if this was angina or not,      although she seems to think not and is not interested in any      noninvasive testing to further evaluate.  She has been active post      MI without any symptoms or limitations, and she is encouraged to      remain active.  She remains on aspirin, Plavix, statin,  beta      blocker, and ACE inhibitor therapy, all of which she is tolerating      well.  2. Hypertension.  Blood pressure is elevated today.  I recommended      increasing Lisinopril for 10 to 20 mg daily; however, she is      reluctant to up any of her medicines at this point and says she      knows she needs to do some things such as exercise and lose weight      and would prefer to try to do that first.  I advised that would be      fine and recommended that she keep a log of her blood pressure on a      daily basis and bring this log to any future appointments so that      we can make informed decisions with regards to her blood pressure      medications and also evaluate the effect of lifestyle      modifications.  3. Chronic diastolic congestive heart failure.  She has some ankle      edema which she says is more dependent in nature and otherwise      appears euvolemic.  She remains on beta blocker and ACE inhibitor,      and, as above, her blood pressure was slightly elevated, but she is      reluctant to go up on medications at this point.  4. Type 2 diabetes mellitus followed by primary care, and she remains      on NovoLog Pen.  5. Hyperlipidemia. She is currently on Vytorin therapy and tolerates      this well.  Her  LDL was 66 during her time of hospitalization in      April. 6. Chronic renal insufficiency.  She did have a BMET this past Friday,      May 2.  Her  potassium was elevated at 5.6.  Renal function was      normal with BUN 28 and creatinine 1.2.  Because of her      hyperkalemia, will repeat a BMET today.  We may need to stop ACE      inhibitor if she is hyperkalemic on today's sample.  7. Obesity. The patient plans to enroll in [facility].  8. Disposition.  The patient will follow with Dr. Antoine Poche in 2      months, sooner if necessary.      Nicolasa Ducking, ANP  Electronically Signed      Jonelle Sidle, MD  Electronically Signed   CB/MedQ  DD: 09/07/2006  DT: 09/07/2006  Job #: [id number]   cc:   [name], MD, [facility]  [name], M.D.

## 2010-09-16 NOTE — Assessment & Plan Note (Signed)
[facility]                            CARDIOLOGY OFFICE NOTE   [name], [name]                     MRN:          [id number]  DATE:10/24/2007                            DOB:          16-Aug-1927    PRIMARY CARE PHYSICIAN:  Turkey R. Rankins, MD   REASON FOR PRESENTATION:  Evaluate the patient with hypertension and  coronary disease status post CABG.   HISTORY OF PRESENT ILLNESS:  The patient is a 77 year old African  American female with a history of coronary disease status post CABG in  November.  She really has not done well since that time.  She has had  fatigue and lower extremity swelling.  At the last visit, I did increase  her lisinopril from 20-40 mg once a day, because her blood pressure was  not well controlled.  She has since had cataract surgery.  She still  says she is just not doing well.  She thinks she is more off balance  because of the lisinopril.  She feels fatigued.  She says I just don't  feel well.  She has had no new PND or orthopnea.  She had no  palpitation, presyncope, or syncope.  She has had no chest discomfort,  neck or arm discomfort.  She is still trying to do some walking every  day, especially to and back from her house to the bus stop.   PAST MEDICAL HISTORY:  Coronary artery disease status post CABG (LIMA to  the LAD, SVG to distal circumflex, and SVG to right coronary artery in  November 2008), mild LV ejection fraction (40-50%), hypertension, morbid  obesity, diabetes mellitus, dyslipidemia, asthma, renal insufficiency,  hyponatremia, and diastolic heart failure.   ALLERGIES AND INTOLERANCES:  None.   MEDICATIONS:  1. Vytorin 10/20 daily.  2. Coreg 12.5 mg b.i.d.  3. Plavix 75 mg daily.  4. Aspirin 81 mg daily.  5. Advair.  6. Levemir FlexPen.  7. Caltrate.  8. Furosemide 40 mg b.i.d.  9. Lisinopril 20 mg daily.  10.Potassium 20 mEq daily.  11.Lisinopril 40 mg daily.   REVIEW OF SYSTEMS:  As  stated in the HPI and otherwise negative for  other systems.   PHYSICAL EXAMINATION:  GENERAL:  The patient is in no distress.  She  seems increasingly angrier and unhappy.  VITAL SIGNS:  Blood pressure 152/87, heart rate 78 and regular, weight  122 pounds, and body mass index 40.  HEENT:  Eyelids unremarkable. Pupils equal round and reactive to light.  Fundi not visualized.  Oral mucosa unremarkable.  NECK:  No jugular venous distention at 45 degrees.  Carotid upstroke  brisk and symmetric.  No bruits.  No thyromegaly.  LYMPHATICS:  No  cervical, axillary, and inguinal.  LUNGS:  Clear to auscultation bilaterally.  BACK:  No costovertebral angle tenderness.  CHEST:  Well-healed sternotomy scar.  HEART:  PMI not displaced or sustained.  S1 and S2 within normal limits.  No S3.  No S4.  No clicks.  No rubs.  No murmurs.  ABDOMEN:  Obese.  Positive bowel sounds normal in frequency and  pitch.  No bruits.  No  rebound.  No guarding.  No midline pulsatile mass.  No organomegaly.  SKIN:  No rashes.  No nodules.  EXTREMITIES:  Pulses 2+.  Mild-to-moderate bilateral ankle edema.  No  cyanosis or clubbing.  Right saphenous vein graft harvest site intact.  NEUROLOGIC:  Oriented to person, place, and time.  Cranial nerves II  through XII grossly intact.  Motor grossly intact.   ASSESSMENT AND PLAN:  1. Coronary disease.  The patient is having no ongoing angina.  No      further cardiovascular testing is suggested.  2. Hypertension.  She feels like she is symptomatic because of her      blood pressure medications.  I do not think she felt any different      prior to increasing her enalapril.  However, she wants to switch,      and so I will try Cozaar 50 mg daily.  I did explain, her the      importance of better blood pressure control.  3. Obesity.  The patient understands the need for diet and exercise.      She has lost no weight.  She does agree to call cardiac rehab and      consider  participating in this.  4. Dyslipidemia is to be followed by Dr. Barbaraann Barthel.  The goal is LDL      less than 70 and HDL greater than 50.  5. Diabetes per Dr. Barbaraann Barthel.  6. Followup.  I will see her back in about 3 months or sooner if      needed.     [name], MD, [facility]  Electronically Signed    JH/MedQ  DD: 10/24/2007  DT: 10/25/2007  Job #: [id number]   cc:   [name], M.D.

## 2010-09-16 NOTE — Discharge Summary (Signed)
NAME:  [name], [name] NO.:  [id number]   MEDICAL RECORD NO.:  [id number]          PATIENT TYPE:  INP   LOCATION:  3715                         FACILITY:  [facility]   PHYSICIAN:  [name], MD, FACCDATE OF BIRTH:  1928-04-25   DATE OF ADMISSION:  08/27/2006  DATE OF DISCHARGE:  08/31/2006                               DISCHARGE SUMMARY   PROCEDURES:  1. Cardiac catheterization.  2. Coronary arteriogram.  3. Left ventriculogram.  4. Right heart catheterization.  5. Drug-eluting stent to the ostial circumflex  6. Drug-eluting stent x2 to the mid-circumflex.  7. Right groin closure with Angio-Seal.   PRIMARY DIAGNOSIS:  Non-ST segment elevation myocardial infarction.   SECONDARY DIAGNOSES:  1. Hypertension.  2. Diabetes.  3. Hyperlipidemia.  4. Family history of coronary artery disease.  5. Obesity.  6. Asthma.  7. Renal insufficiency.  8. Hyponatremia.  9. Acute diastolic congestive heart failure as well as chronic      diastolic congestive heart failure.   TIME SPENT AT DISCHARGE:  41 minutes.   HOSPITAL COURSE:  [name] is a 77 year old female with no previous  history of coronary artery disease.  She was evaluated by Dr. Antoine Poche  in June of 2007 for dyspnea on exertion and lower extremity swelling.  At that time she had a stress test and an echocardiogram.  The stress  test showed no ischemia, and her EF was normal with borderline LVH on  echocardiogram.   She developed exertional chest pain that was relieved by rest in less  than five minutes.  On the day of exertion, she was walking and had  recurrent symptoms that she described as a pressure and a tightness.  She stated it reached a 10/10.  She had associated shortness of breath.  Her symptoms resolved with rest, but she came to the emergency room and  was admitted for further evaluation.   She was taken to the cath lab on August 27, 2006 because her initial  troponin was elevated at  0.18.  She had a right and left heart  catheterization which showed a 99% proximal circumflex which was treated  with a drug-eluting Endeavor stent and a 70% mid- circumflex which was  treated with two overlapping drug-eluting Endeavor stents.  Residual  coronary artery disease in the left main at 30, the LAD at 50, the PDA  at 50, and the OM at 40 are to be managed medically.  Her EF was  approximately 60% with no wall motion abnormality.   Her peak CK-MB was 337/10 with a peak troponin I of 5.93.  A lipid  profile was performed which showed a total cholesterol of 134,  triglycerides 123, HDL 33, LDL 66.  Her BUN and creatinine were  initially mildly abnormal at 30 and 1.2.  Post-procedure, her BUN and  creatinine increased to 47/1.83.  At discharge, her BUN is 51 with a  creatinine 1.64.  She is to get a BMET this Friday in our office.  Her  triamterene/HCTZ is continued, but the Lasix was decreased from 40 to 20  mg a day.  She has been started on an ACE inhibitor and therefore will  be followed very closely.   A hemoglobin A1C was checked to assess her glycemic control and was  elevated at 7.9.  She is encouraged to eat a heart healthy diabetic diet  and follow up with Dr. Luciana Axe.   Her chest x-ray on admission was slightly abnormal, with the radiology  report showing a prominent right hilum as well as vascular congestion  and bibasilar atelectasis.  PA and lateral film was recommended, and  this was performed on August 28, 2006.  Compared to the previous portable  film, her aeration was improved.  She had coarse bilateral  bronchovascular markings without confluent airspace infiltrate or edema.  This was felt largely stable compared to a film in 2005.  On the two-  view chest x-ray, she was felt to have chronic bilateral parenchymal  changes without acute or superimposed abnormality.  She is to continue  Advair for asthma with no other therapy needed.    [name] was seen by  cardiac rehab as well as care management.  She is  to get Plavix assistance for three days from the hospital and is to  follow up with [facility] for ongoing prescriptions.  She is to follow  up with cardiac rehab as an outpatient.   On August 31, 2006, [name] was evaluated by Dr. Antoine Poche.  She was  ambulating without chest pain or shortness of breath.  He reviewed her  medication list and evaluated [name].  He considered her stable for  discharge, with outpatient followup and lab work arranged.   DISCHARGE INSTRUCTIONS:  Her activity level is to be increased slowly.  She is not to drive for four days and not to lift anything for two  weeks.  She is to follow up with cardiac rehab.  She is to follow up  with Dr. Antoine Poche on Sep 07, 2006 at 11:45.  She is to get a BMET at that  office visit and a BMET on Friday, Sep 03, 2006 as well.   DISCHARGE MEDICATIONS:  1. Coated aspirin 325 mg daily  2. Plavix 75 mg daily.  3. NovoLog Pen insulin 28 units daily as prior to admission.  4. Advair 250/50 as prior to admission.  5. Vytorin 10/80 daily.  6. Coreg 12.5 mg b.i.d.  7. Nitroglycerin sublingual p.r.n.  8. Lisinopril 10 mg daily.  9. Lasix 20 mg daily.  10.Triamterene and HCTZ 37.5/25 mg daily.      Theodore Demark, PA-C      [name], MD, [facility]  Electronically Signed    RB/MEDQ  D:  08/31/2006  T:  08/31/2006  Job:  [id number]   cc:   Jillyn Hidden A. Rankin, M.D.

## 2010-09-16 NOTE — Cardiovascular Report (Signed)
NAME:  [name], [name]              ACCOUNT NO.:  [id number]   MEDICAL RECORD NO.:  [id number]          PATIENT TYPE:  INP   LOCATION:  6531                         FACILITY:  [facility]   PHYSICIAN:  Everardo Beals. Juanda Chance, MD, FACCDATE OF BIRTH:  1928-01-31   DATE OF PROCEDURE:  DATE OF DISCHARGE:                            CARDIAC CATHETERIZATION   PRIMARY CARE PHYSICIAN:  Is Dr. Luciana Axe at [facility].   CARDIOLOGIST:  Dr. Antoine Poche.   CLINICAL HISTORY:  [name] is 77 years old, in April this year had  an intervention by Dr. Samule Ohm, with Endeavor stents placed in the ostium  of the circumflex artery and overlapping Endeavor stents placed in the  mid-circumflex artery.  She recently had a Myoview scan, which was  abnormal and was brought into [facility] lab for catheterization by Dr.  Antoine Poche, which showed a 95% in-stent restenosis in the ostium of  circumflex artery, as well as a 70% stenosis distal to the stent and a  70% in-stent restenosis in the mid-stents, within the stents in the mid-  circumflex artery.  She had developed symptoms and her troponin returned  positive, and she was admitted the hospital to have angioplasty on  Friday with Dr. Riley Kill but, due to an emergency, this was postponed  until today.   PROCEDURE:  The procedure was performed via the right femoral arteries  and arterial sheath and a CLS 3.5 guiding catheter with side holes 6-  [place].  The patient was given antiemetics bolus infusion and had been  given chewable aspirin in the morning and had been on chronic Plavix  therapy.  We crossed the lesion with a PT2 light support wire, without  too much difficulty, although it was an extremely tight lesion.  We pre-  dilated the lesion with a 2.0 x 20-mm Maverick, performing one inflation  up to 10 atmospheres for 20 seconds.  We then used a 2.75 x 6 cutting  balloon and performed multiple inflations within the stent at the ostium  of circumflex artery and also dilated in a  lesion just distal to the  stent that was located in the ostium of the circumflex artery.  We also  dilated within the overlapping stents in the mid-vessel, where there was  some more focal narrowing of 70%.  We then performed intravascular  ultrasound, using a __________  catheter with automatic pullback.  This  showed that both stents were fairly well expanded, but there was tissue  ingrowth within both.  There was also a mildly tight stenosis distal to  the ostial stent, where we had performed cutting balloon angioplasty.  For this reason, we elected to place a new Promus stent overlapping the  ostial stent, to treat this proximal circumflex lesion.  We chose a 2.5  x 12-mm Promus and deployed this with one inflation up to 12 atmospheres  for 20 seconds.  This stent overlapped the ostial stent.  We then post-  dilated the two overlapping stents in the mid-circumflex artery and the  two overlapping stents in the ostial and proximal circumflex artery with  a 2.75 x 50-mm Quantum  Maverick, performing multiple inflations within  both stents, up to 16 atmospheres for 20 seconds.  Final __________  was  then performed through a guiding catheter.  The patient tolerated the  procedure well and left the laboratory in satisfactory condition.   RESULTS:  Initially, the stenosis within the stent and the ostium of the  circumflex artery was estimated at 95%, and there was a second stenosis  that was 70% just distal to the stent.  Following placement of a new  Promus stent and cutting balloon angioplasty and post-dilatation of the  ostial stent, the stenoses improved from 95 to 20 and from 70 to zero.   The lesion in the mid stent improved from 70% to 10%, with cutting  balloon angioplasty and post dilatation.   CONCLUSION:  1. Successful PCI of the ostial in-stent restenosis and proximal peri-      stent restenosis, using a new Promus stent cutting balloon      angioplasty and post-dilatation,  with improvement ostium from 95%      to 20% and improvement in the proximal lesion from 70% to 0%.  2. Successful PCI of the in-stent restenotic lesion in the mid-      circumflex artery, within two overlapping stents using cutting      balloon angioplasty and post-dilatation with improvement in stent      area of narrowing from 70% to 10%.   DISPOSITION:  The patient was returned to [facility] room for further  observation.  __________  have a moderate incidence of recurrence.  She  should remain on long-term Plavix.      Bruce Elvera Lennox Juanda Chance, MD, [facility]  Electronically Signed     BRB/MEDQ  D:  12/27/2006  T:  12/27/2006  Job:  [id number]

## 2010-09-16 NOTE — Assessment & Plan Note (Signed)
[facility]                            CARDIOLOGY OFFICE NOTE   [name], [name]                     MRN:          [id number]  DATE:12/16/2006                            DOB:          1927/05/24    The primary is [place] R. Rankins, M.D.   REASON FOR PRESENTATION:  Evaluate patient with chest pain.   HISTORY OF PRESENT ILLNESS:  The patient called yesterday to have  herself added to the schedule.  She was actually in the emergency room  yesterday with chest discomfort.  Her predominant complaint, however,  seemed to be more shortness of breath.  She was apparently treated with  bronchodilators.  She had an EKG with no acute findings, negative point  of care markers, normal labs, and nothing acute on chest x-ray.  However, she says she has been taking about four nitroglycerin a day.  She takes it for vague symptoms.  Some of this is chest discomfort,  particularly when she tries to take a deep breath.  She feels like she  gets this need to take a deep breath at night.  She does get short of  breath walking a moderate distance.  She will get a chest heaviness.  She thinks this might be a little worse than after her angioplasty.  She  was never completely symptom-free after a non-Q-wave myocardial  infarction earlier this year.  She has not been having any resting acute  severe discomfort.  She has had no radiation to her arms.  She has had  some jaw discomfort.  All of this comes on at vague times, though she  has noticed it with activity.  She says it does go away with  nitroglycerin.  She has not had any palpitations, presyncope or syncope.  She has not had any weight gain or swelling.  She is not describing PND  or orthopnea.   PAST MEDICAL HISTORY:  1. Coronary artery disease, status post non-Q-wave myocardial      infarction August 27, 2006.  A 99% proximal circumflex treated with      a drug-eluting stent, mid 70% circumflex treated with  drug-eluting      stent, left main had 30% stenosis, LAD 50% stenosis, PDA 50%      stenosis, OM 40% stenosis.  EF 60%.  2. Hypertension.  3. Diabetes mellitus.  4. Dyslipidemia.  5. Obesity.  6. Asthma.  7. Renal insufficiency.  8. Hyponatremia.  9. Diastolic heart failure.   ALLERGIES:  None.   CURRENT MEDICATIONS:  1. Caltrate.  2. Whole Source dietary supplement.  3. Pravastatin 20 mg daily.  4. Advair 250/50 mcg.  5. Triamterine/hydrochlorothiazide 37.5/25 mg.  6. Lisinopril 20 mg daily.  7. Aspirin 81 mg daily.  8. Carvedilol 25 mg b.i.d.  9. Plavix 75 mg daily.  10.Levemir.  11.Furosemide 20 mg daily.   REVIEW OF SYSTEMS:  As stated in the HPI and otherwise negative for  other systems.   PHYSICAL EXAMINATION:  The patient is in no acute distress.  Blood pressure 172/68, heart rate 66 and regular, weight 224 pounds.  Body mass index 41.  HEENT:  Eyelids unremarkable.  Pupils equal, round, and reactive to  light, fundi not visualized.  NECK:  No jugular venous distention at 45 degrees.  Carotid upstroke  brisk and symmetric.  No bruits, no thyromegaly.  LYMPHATIC:  No cervical, axillary or inguinal adenopathy.  LUNGS:  Clear to auscultation bilaterally.  BACK:  No costovertebral angle tenderness.  CHEST:  Unremarkable.  HEART:  PMI not displaced or sustained.  S1 and S2 within normal limits,  no S3, no S4, no clicks, no rubs, no murmurs.  ABDOMEN:  Obese, positive bowel sounds, normal in frequency in pitch, no  bruits, no rebound, no guarding, no midline pulsatile mass, no  organomegaly.  SKIN:  No rashes, no nodules.  EXTREMITIES:  2+ pulses, no edema.   EKG (yesterday):  Sinus rhythm, rate 62, axis within normal limits,  intervals within normal limits.  No acute ST-T wave changes, poor  anterior R-wave progression.   ASSESSMENT AND PLAN:  1. Chest discomfort.  The patient's chest discomfort is vague.  It is      hard to determine whether she ever had this  go away completely      after her angioplasty.  At this point my pretest suspicion that she      has had recurrent stenosis or new high-grade lesion is on the      moderate side rather than high.  I do not think catheterization is      indicated.  However, a stress perfusion study is indicated and I      will proceed with this.  2. Hypertension.  Her blood pressure remains elevated.  I will      increase her lisinopril to 40 mg daily.  She will get a BMET in 2      weeks.  3. Dyslipidemia.  She was due to have a lipid profile today.  However,      I will defer until she comes back in 2 weeks at the time of her      BMET.  4. Follow-up will be in 2 months or sooner if she has increasing      symptoms or an abnormal perfusion study.     [name], MD, [facility]  Electronically Signed    JH/MedQ  DD: 12/16/2006  DT: 12/17/2006  Job #: [id number]   cc:   [name], M.D.

## 2010-09-19 NOTE — H&P (Signed)
NAME:  [name], REACH NO.:  [id number]   MEDICAL RECORD NO.:  [id number]          PATIENT TYPE:  EMS   LOCATION:  MAJO                         FACILITY:  [facility]   PHYSICIAN:  Gerrit Friends. Dietrich Pates, MD, FACCDATE OF BIRTH:  1927/05/30   DATE OF ADMISSION:  08/27/2006  DATE OF DISCHARGE:                              HISTORY & PHYSICAL   PRIMARY CARE PHYSICIAN:  Dr. Luciana Axe at [facility].   PRIMARY CARDIOLOGIST:  [name], MD, [facility].   CHIEF COMPLAINT:  Chest pain.   HISTORY OF PRESENT ILLNESS:  Ms. Haskins is a 77 year old female with no  previous known history of coronary artery disease.  She was evaluated by  Dr. Antoine Poche in 2007, or lower extremity edema and dyspnea on exertion.  AT that time an echocardiogram and a Myoview were both normal.   Ms. Berkey states that she has approximately an 43-month history of  episodic chest pressure and tightness.  This is exertional and resolves  in less than a minute or less than 5 minutes with rest.  She also has  occasional fluttering feeling in her neck, which she states is also  associated with increased exertion or emotional stress.  Today, she was  walking as she tries to do every day, and she developed the chest  pressure and tightness.  It reached a 10/10.  It was associated with  shortness of breath but no nausea, vomiting or diaphoresis.  The  symptoms began at approximately 8:30 this morning.  She rested and the  symptoms resolved in less than 5 minutes, but when she would try to  continue walking her symptoms would come back again.  She had these  symptoms before but that had never been this bad.  Because she had  repeated symptoms today and because her shortness of breath was worse  than usual, she came to the emergency room.  She has had no chest pain  or shortness of breath while resting comfortably.   PAST MEDICAL HISTORY:  She has a history of:  1. Diabetes.  2. Hypertension.  3. Hyperlipidemia.  4.  Family history of coronary artery disease though not prematurely.      She had a treadmill Myoview in June 2007, that showed no scar or      ischemia.  She had an echocardiogram that showed borderline LVH but      a normal EF.  5. She reportedly has a history of asthma.  6. She is obese.   SURGICAL HISTORY:  None.   ALLERGIES:  NO KNOWN DRUG ALLERGIES.   CURRENT MEDICATIONS:  1. Vytorin 10/80 daily.  2. Aspirin 81 mg daily.  3. Triamterene/HCTZ 37.5/25 mg daily.  4. Furosemide 40 mg a day.  5. Advair 250/50 daily  6. NovoLog pen 28 units q.a.m.   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  She quit tobacco 40 years ago and has never abused alcohol or  drugs.   FAMILY HISTORY:  Her mother died at age  86, with a history of coronary  artery disease.  Her father died in his 74s, and she does not believe he  had heart disease.  She has no siblings.  There is no history of  premature coronary artery disease in any other relatives.   REVIEW OF SYSTEMS:  She complains of some nasal congestion in her oral  and nasopharynx.  She has mild hearing loss.  She has chronic dyspnea on  exertion but it has been worse recently.  She has occasional lower  extremity edema but this is generally well controlled by support hose.  The chest pain and palpitations are described above.  She has been  coughing today but denies any wheezing.  She has occasional arthralgias  but denies any reflux symptoms or melena.  Review of systems is  otherwise negative.   PHYSICAL EXAMINATION:  VITAL SIGNS:  Temperature is 97.9, blood pressure  155/73, pulse 84, respiratory rate 18, O2 saturation 96% on room air.  GENERAL:  She is a well-developed, obese, African American female in no  acute distress.  HEENT:  Head is normocephalic and atraumatic with extraocular movements  intact.  Sclerae clear.  Nares without  discharge.  Oropharynx without  erythema.  NECK:  There is no lymphadenopathy, thyromegaly or bruit noted but she  has JVD halfway up her neck.  HEART:  Regular in rate and rhythm with an S1-S2 and no significant  murmur or gallop is noted.  Her distal pulses in her lower extremities  are slightly decreased on the left and she has some ankle edema present  but no femoral bruits are appreciated.  LUNGS:  She has some rales in the bases but essentially clear to  auscultation bilaterally.  SKIN:  No rashes or lesions are noted.  ABDOMEN:  Soft and nontender with active bowel sounds.  EXTREMITIES:  There is no cyanosis, clubbing, rash or lesions noted.  MUSCULOSKELETAL:  No joint deformity or effusions and no spine or CVA  tenderness.  NEURO:  She is alert and oriented.  Cranial nerves II-XII grossly  intact.   Chest x-ray shows decreased volume with vascular congestion and  bibasilar atelectasis, a prominent right hilum is noted.  PA and lateral  was recommended.  EKG:  Sinus rhythm, rate 85, with mild ST depression  of approximately 1-mm in leads II, V4-V6.   LABORATORY VALUES:  Hemoglobin 12.2, hematocrit 36.  Sodium 135,  potassium 4.9, chloride 103, CO2 27, BUN 30, creatinine 1.2, glucose  329.  Point-of-care markers MB 4.2, myoglobin 273, troponin 0.18.   IMPRESSION:  Chest pain.   PLAN:  1. She presents with progressive angina and multiple cardiovascular      risk factors.  2. She has been started on IV heparin and nitroglycerin and a      selective beta blockers is used because of her questionable history      of asthma.  3. Cardiac catheterization is recommended to define her anatomy and      the risks and benefits of the procedure were explained to the      patient, who agrees to proceed.  4. She will be continued on her home medications.  5. We will check a lipid profile and hemoglobin A1c. 6. A chest x-ray will also be rechecked with a PA and lateral film       performed.      Theodore Demark, PA-C      Gerrit Friends. Dietrich Pates, MD, [facility]  Electronically Signed  RB/MEDQ  D:  08/27/2006  T:  08/27/2006  Job:  [id number]   cc:   Rankin, Dr.

## 2010-09-19 NOTE — Discharge Summary (Signed)
NAMEMarland Kitchen  [name], [name] NO.:  [id number]   MEDICAL RECORD NO.:  [id number]          PATIENT TYPE:  INP   LOCATION:  2037                         FACILITY:  [facility]   PHYSICIAN:  Sheliah Plane, MD    DATE OF BIRTH:  [birth year]   DATE OF ADMISSION:  03/15/2007  DATE OF DISCHARGE:  03/24/2007                               DISCHARGE SUMMARY   FINAL DIAGNOSIS:  Coronary occlusive disease with unstable angina,  status post restenosis with stent graft x2.   IN-HOSPITAL DIAGNOSES:  1. Pre- and postoperative acute blood loss anemia.  2. Bilateral 40% to 60% internal carotid artery stenosis.  3. Volume overload postoperatively.  4. Acute renal insufficiency postoperatively.   SECONDARY DIAGNOSES:  1. History of coronary artery disease with a non-ST myocardial      infarction in April 2008, status post placement of two ostial drug-      eluting stents in the proximal circumflex, one in the mid      circumflex.  2. Hypertension.  3. Hyperlipidemia.  4. Diabetes mellitus.  5. History of tobacco abuse.  6. History of asthma.  7. Chronic renal insufficiency.  8. Acute diastolic congestive heart failure on chronic diastolic      congestive heart failure.   IN-HOSPITAL OPERATIONS AND PROCEDURES:  1. Cardiac catheterization.  2. Urgent coronary artery bypass grafting x3 with a left internal      mammary artery to the left anterior descending coronary artery,      reverse saphenous vein graft to the distal circumflex, reverse      saphenous vein graft to distal right coronary artery with right      endovein harvesting.   HISTORY AND PHYSICAL/HOSPITAL COURSE:  The patient is a 77 year old  female who over the past 8 months has had 2 previous interventions, once  for 3 stents placed in the proximal circumflex and subsequently stenosis  with redilatation with cutting balloon in August 2008.  The patient  returns with severe unstable anginal symptoms with chest pain at  rest,  on heparin and nitroglycerin and has been on Plavix.  She underwent  cardiac catheterization.  This showed a 99% ostial circumflex lesion, 3-  vessel coronary artery disease, and ongoing chest pain.  It was decided  to proceed with more urgent coronary artery bypass grafting and not wait  in spite of increased risk of bleeding because of the Plavix.  Dr.  Tyrone Sage discussed with the patient risks and benefits.  The patient  acknowledged her understanding and agreed to proceed.  She was scheduled  for urgent surgery March 18, 2007.  For details of the patient's past  medical history and physical exam, please see dictated H&P.   The patient was taken to the operating room on March 18, 2007,  where  she underwent urgent coronary artery bypass grafting x3 using a left  internal mammary artery to the left anterior descending coronary artery,  reverse saphenous vein graft to the distal circumflex, reverse saphenous  vein graft to distal right coronary artery with right endovein  harvesting.  The patient tolerated this procedure well  and was  transferred to the intensive care unit in stable condition.  Postoperatively the patient was noted to be hemodynamically stable.  She  was able to be extubated the evening of surgery.  Post extubation, she  was noted to be alert and oriented x4.  Neuro intact.  Post extubation,  the patient was placed on nasal cannula with sats greater than 90%.  Chest x-ray done postop day #1 was stable.  The patient had minimal  drainage from chest tubes and chest tubes were discontinued in a normal  fashion.  Repeat chest x-ray remained stable.  The patient was  encouraged to use her incentive spirometer during her postoperative  course.  She does have history of asthma and was noted to have  expiratory wheezing during her hospital course.  She was continued on  her Advair.  The patient's pulmonary status was stable prior to  discharge home.  She was able  to be weaned off oxygen, satting greater  than 90% on room air.  Postoperatively the patient was noted to be  hypotensive and was placed on a dopamine drip.  This was slowly able to  be weaned and discontinued by postop day #3.  A Swan-Ganz catheter was  discontinued in normal fashion.  The patient was able to be restarted on  Coreg.  Blood pressure and heart rate remained stable, and low-dose  lisinopril was able to be added later.  The patient remained in normal  sinus rhythm during her postoperative course.  She was noted to have  preoperative anemia with hemoglobin/hematocrit of 9.6 and 20.7.  Intraoperatively the patient did receive transfusion.  She was  hemodynamically stable on transfer to the intensive care unit.  Hemoglobin and hematocrit were followed closely postoperatively.  On  November 17, the patient's hemoglobin and hematocrit dropped to 7.2 and  21.6.  One unit of packed red blood cells was ordered.  Followup H&H the  following day improved significantly to 9.3 and 27.3.  The patient was  asymptomatic.  H&H remained stable during the remainder of her  postoperative course.  The patient's creatinine was followed closely  postoperatively.  She does have a history of chronic renal insufficiency  and she __________ baseline creatinine of 1.6.  Her creatinine was  elevated slightly at 1.67 on the day of surgery.  It was followed during  her postoperative course and stabilized, remaining under 1.6 the  remainder of the time.  The patient did have mild volume overload as  well postoperatively.  She was started on low-dose diuretics.  This was  followed closely due to the patient's chronic renal insufficiency, which  as stated above, creatinine remained stable.  She was continued on  diuretics with daily weights being obtained.  The patient's weight was  back near baseline prior to discharge home.  Postoperatively the patient  was working with cardiac rehab.  She was progressing  well and ambulating  with assistance prior to discharge home.  The patient was also  tolerating diet well.  No nausea or vomiting noted.   On March 24, 2007, the patient's vital signs were noted to be stable.  She was afebrile.  Heart rate, blood pressure stable.  She was  satting  greater than 90% on room air.  Blood sugars were stable.  The patient's  blood sugars noted per monitor closely during postoperative course due  to a history of diabetes mellitus.  She was continued on Lantus insulin  during her admission and planned  to restart on Levemir at the time of  discharge.  The patient noted to be in normal sinus rhythm.  Pulmonary  status was stable.  All incisions were clean, dry, intact and healing  well.   LABORATORY DATA:  BMP done the day of discharge showed a sodium of 136,  potassium 4.5, chloride of 98, bicarb 32, BUN of 38, creatinine 1.32,  glucose of 162.  CBC done the day prior showed a white count of 6.7,  hemoglobin 9.2, hematocrit 26.5, platelet count 178.  The patient was  felt to be stable and ready for discharge home on March 24, 2007.   FOLLOWUP APPOINTMENTS:  Followup appointment was arranged with Dr.  Tyrone Sage for April 21, 2007, at 12:30 p.m.  The patient will need to  obtain PA and lateral  chest x-ray 30 minutes prior to this appointment.  The patient will need to follow up with Dr. Antoine Poche in 2 weeks.  She  will need to contact his office to make these arrangements.   ACTIVITY:  The patient instructed no driving __________ no heavy lifting  over 10 pounds.  She was told to ambulate 3 to 4 times per day, progress  as tolerated, and to continue her breathing exercises.   INCISIONAL CARE:  The patient is told to shower, washing her incisions  using soap and water.  She is to contact the office if she develops any  drainage or opening from any of her incision sites.   DISCHARGE MEDICATIONS:  1. Aspirin 325 mg daily.  2. Coreg 12.5 mg b.i.d.  3.  Lisinopril 5 mg daily.  4. Vytorin 10/20 mg daily.  5. Advair 250/50 b.i.d.  6. Plavix 75 mg daily.  7. Levemir as used at home.  8. Lasix 40 mg daily.  9. Potassium chloride 20 mEq daily.  10.__________ mg 1 to 2 tablets q.4-6 h p.r.n.      Theda Belfast, [place]      Sheliah Plane, MD  Electronically Signed    KMD/MEDQ  D:  07/19/2007  T:  07/19/2007  Job:  [id number]   cc:   Sheliah Plane, MD  [name], MD, [facility]

## 2010-10-02 ENCOUNTER — Telehealth: Payer: Self-pay | Admitting: Physician Assistant

## 2010-10-02 ENCOUNTER — Emergency Department (HOSPITAL_COMMUNITY)
Admission: EM | Admit: 2010-10-02 | Discharge: 2010-10-02 | Disposition: A | Discharge status: Home / Self Care | Payer: Medicare Other | Attending: Emergency Medicine | Admitting: Emergency Medicine

## 2010-10-02 DIAGNOSIS — Z951 Presence of aortocoronary bypass graft: Secondary | ICD-10-CM | POA: Insufficient documentation

## 2010-10-02 DIAGNOSIS — K219 Gastro-esophageal reflux disease without esophagitis: Secondary | ICD-10-CM | POA: Insufficient documentation

## 2010-10-02 DIAGNOSIS — J45909 Unspecified asthma, uncomplicated: Secondary | ICD-10-CM | POA: Insufficient documentation

## 2010-10-02 DIAGNOSIS — I10 Essential (primary) hypertension: Secondary | ICD-10-CM | POA: Insufficient documentation

## 2010-10-02 DIAGNOSIS — H53489 Generalized contraction of visual field, unspecified eye: Secondary | ICD-10-CM | POA: Insufficient documentation

## 2010-10-02 DIAGNOSIS — H5460 Unqualified visual loss, one eye, unspecified: Secondary | ICD-10-CM | POA: Insufficient documentation

## 2010-10-02 DIAGNOSIS — E7800 Pure hypercholesterolemia, unspecified: Secondary | ICD-10-CM | POA: Insufficient documentation

## 2010-10-02 DIAGNOSIS — J3489 Other specified disorders of nose and nasal sinuses: Secondary | ICD-10-CM | POA: Insufficient documentation

## 2010-10-02 DIAGNOSIS — R609 Edema, unspecified: Secondary | ICD-10-CM | POA: Insufficient documentation

## 2010-10-02 DIAGNOSIS — E119 Type 2 diabetes mellitus without complications: Secondary | ICD-10-CM | POA: Insufficient documentation

## 2010-10-02 DIAGNOSIS — R42 Dizziness and giddiness: Secondary | ICD-10-CM | POA: Insufficient documentation

## 2010-10-02 DIAGNOSIS — Z79899 Other long term (current) drug therapy: Secondary | ICD-10-CM | POA: Insufficient documentation

## 2010-10-02 DIAGNOSIS — H1189 Other specified disorders of conjunctiva: Secondary | ICD-10-CM | POA: Insufficient documentation

## 2010-10-02 NOTE — Telephone Encounter (Signed)
All Cardiac faxed to [facility] @[phone]  10/02/10/km

## 2010-10-06 ENCOUNTER — Inpatient Hospital Stay (HOSPITAL_COMMUNITY)
Admission: EM | Admit: 2010-10-06 | Discharge: 2010-10-08 | DRG: 675 | Disposition: A | Discharge status: Home Health | Payer: Medicare Other | Attending: Internal Medicine | Admitting: Internal Medicine

## 2010-10-06 ENCOUNTER — Emergency Department (HOSPITAL_COMMUNITY): Payer: Medicare Other

## 2010-10-06 ENCOUNTER — Inpatient Hospital Stay (HOSPITAL_COMMUNITY): Payer: Medicare Other

## 2010-10-06 ENCOUNTER — Encounter (HOSPITAL_COMMUNITY): Payer: Self-pay | Admitting: Radiology

## 2010-10-06 DIAGNOSIS — Z951 Presence of aortocoronary bypass graft: Secondary | ICD-10-CM

## 2010-10-06 DIAGNOSIS — E785 Hyperlipidemia, unspecified: Secondary | ICD-10-CM | POA: Diagnosis present

## 2010-10-06 DIAGNOSIS — D649 Anemia, unspecified: Secondary | ICD-10-CM | POA: Diagnosis present

## 2010-10-06 DIAGNOSIS — I2789 Other specified pulmonary heart diseases: Secondary | ICD-10-CM | POA: Diagnosis present

## 2010-10-06 DIAGNOSIS — I2510 Atherosclerotic heart disease of native coronary artery without angina pectoris: Secondary | ICD-10-CM | POA: Diagnosis present

## 2010-10-06 DIAGNOSIS — R5381 Other malaise: Secondary | ICD-10-CM | POA: Diagnosis present

## 2010-10-06 DIAGNOSIS — I129 Hypertensive chronic kidney disease with stage 1 through stage 4 chronic kidney disease, or unspecified chronic kidney disease: Principal | ICD-10-CM | POA: Diagnosis present

## 2010-10-06 DIAGNOSIS — E669 Obesity, unspecified: Secondary | ICD-10-CM | POA: Diagnosis present

## 2010-10-06 DIAGNOSIS — E119 Type 2 diabetes mellitus without complications: Secondary | ICD-10-CM | POA: Diagnosis present

## 2010-10-06 DIAGNOSIS — J45909 Unspecified asthma, uncomplicated: Secondary | ICD-10-CM | POA: Diagnosis present

## 2010-10-06 DIAGNOSIS — N183 Chronic kidney disease, stage 3 unspecified: Secondary | ICD-10-CM | POA: Diagnosis present

## 2010-10-06 HISTORY — DX: Essential (primary) hypertension: I10

## 2010-10-06 LAB — URINALYSIS, ROUTINE W REFLEX MICROSCOPIC
Bilirubin Urine: NEGATIVE
Glucose, UA: NEGATIVE mg/dL
Ketones, ur: NEGATIVE mg/dL
Protein, ur: NEGATIVE mg/dL
pH: 7 (ref 5.0–8.0)

## 2010-10-06 LAB — BASIC METABOLIC PANEL
Calcium: 9.2 mg/dL (ref 8.4–10.5)
Creatinine, Ser: 1.38 mg/dL — ABNORMAL HIGH (ref 0.4–1.2)
GFR calc Af Amer: 45 mL/min — ABNORMAL LOW (ref >60)
GFR calc non Af Amer: 37 mL/min — ABNORMAL LOW (ref >60)
Glucose, Bld: 72 mg/dL (ref 70–99)
Sodium: 140 mEq/L (ref 135–145)

## 2010-10-06 LAB — CARDIAC PANEL(CRET KIN+CKTOT+MB+TROPI)
Total CK: 197 U/L — ABNORMAL HIGH (ref 7–177)
Troponin I: <0.3 ng/mL (ref <0.30)

## 2010-10-06 LAB — CBC
MCHC: 33.1 g/dL (ref 30.0–36.0)
RDW: 12.5 % (ref 11.5–15.5)

## 2010-10-06 LAB — COMPREHENSIVE METABOLIC PANEL
ALT: 15 U/L (ref 0–35)
AST: 19 U/L (ref 0–37)
Alkaline Phosphatase: 85 U/L (ref 39–117)
CO2: 30 mEq/L (ref 19–32)
Calcium: 9.1 mg/dL (ref 8.4–10.5)
Chloride: 97 mEq/L (ref 96–112)
GFR calc Af Amer: 45 mL/min — ABNORMAL LOW (ref >60)
GFR calc non Af Amer: 37 mL/min — ABNORMAL LOW (ref >60)
Glucose, Bld: 260 mg/dL — ABNORMAL HIGH (ref 70–99)
Potassium: 4.2 mEq/L (ref 3.5–5.1)
Sodium: 137 mEq/L (ref 135–145)
Total Bilirubin: 0.4 mg/dL (ref 0.3–1.2)

## 2010-10-06 LAB — URINE MICROSCOPIC-ADD ON

## 2010-10-06 LAB — GLUCOSE, CAPILLARY

## 2010-10-06 LAB — HEMOGLOBIN A1C
Hgb A1c MFr Bld: 5.6 % (ref <5.7)
Mean Plasma Glucose: 114 mg/dL (ref <117)

## 2010-10-06 LAB — SEDIMENTATION RATE: Sed Rate: 30 mm/hr — ABNORMAL HIGH (ref 0–22)

## 2010-10-06 LAB — PROTIME-INR
INR: 1.13 (ref 0.00–1.49)
Prothrombin Time: 14.7 seconds (ref 11.6–15.2)

## 2010-10-06 MED ORDER — IOHEXOL 350 MG/ML SOLN
75.0000 mL | Freq: Once | INTRAVENOUS | Status: AC | PRN
  Administered 2010-10-06: 75 mL via INTRAVENOUS

## 2010-10-07 ENCOUNTER — Other Ambulatory Visit: Payer: Self-pay | Admitting: General Surgery

## 2010-10-07 LAB — CBC
Platelets: 144 10*3/uL — ABNORMAL LOW (ref 150–400)
RBC: 3.39 MIL/uL — ABNORMAL LOW (ref 3.87–5.11)
RDW: 12.3 % (ref 11.5–15.5)
WBC: 5.1 10*3/uL (ref 4.0–10.5)

## 2010-10-07 LAB — BASIC METABOLIC PANEL
BUN: 35 mg/dL — ABNORMAL HIGH (ref 6–23)
Creatinine, Ser: 1.16 mg/dL (ref 0.4–1.2)
GFR calc non Af Amer: 45 mL/min — ABNORMAL LOW (ref >60)
Glucose, Bld: 164 mg/dL — ABNORMAL HIGH (ref 70–99)
Potassium: 4.4 mEq/L (ref 3.5–5.1)

## 2010-10-07 LAB — GLUCOSE, CAPILLARY
Glucose-Capillary: 109 mg/dL — ABNORMAL HIGH (ref 70–99)
Glucose-Capillary: 99 mg/dL (ref 70–99)
Glucose-Capillary: 100 mg/dL — ABNORMAL HIGH (ref 70–99)

## 2010-10-07 LAB — CARDIAC PANEL(CRET KIN+CKTOT+MB+TROPI)
CK, MB: 2.9 ng/mL (ref 0.3–4.0)
Relative Index: 1.8 (ref 0.0–2.5)
Troponin I: <0.3 ng/mL (ref <0.30)

## 2010-10-07 NOTE — Consult Note (Signed)
NAMEMarland Richardson  NIKEYA, [name] NO.:  [id number]  MEDICAL RECORD NO.:  [id number]  LOCATION:  3007                         FACILITY:  [facility]  PHYSICIAN:  Levie Heritage, MD       DATE OF BIRTH:  1927/07/02  DATE OF CONSULTATION:  10/06/2010 DATE OF DISCHARGE:                                CONSULTATION   REFERRING PHYSICIAN:  Marcellus Scott, MD  REASON FOR CONSULTATION:  Transient right eye visual changes.  CHIEF COMPLAINT:  Transient right eye visual changes with overall feeling weak and balance problem in the setting of high blood pressure.  HISTORY OF PRESENT ILLNESS:  This patient is an 77 year old woman who woke up this morning and felt that she has been having blurring of the right eye vision.  The patient states that she already has poor vision in her left eye this morning.  She felt that it was just blurring with certain feeling of halos in her right eye vision.  She did not feel overall well as well and when she walked, she realized that she is having sudden balance problem with having altered sensation feeling in both of her legs as well. The patient is very affirmative that she had her blood pressures checked at home with this condition and was found to be more than 200 systolic ranges and that is when she called her eye doctor who advised her to go to the [facility] ER. It is important to mention that the patient had almost complete vision loss in her left eye hardly few days ago for what the diagnosis of retinal artery occlusion was made by the ophthalmologist, therefore the possibility of temporal arteritis was thought off in this whole setting. However, on detailed discussion to the patient, she clearly denies any headaches now or before in general.  She also denies any claudication feeling on her jaw.  She denies any history suggestive of immune problem or vasculopathy otherwise.  She denies clearly tenderness on her temple as well. Currently, the  patient is at her baseline without any additional neurological deficit.  PAST MEDICAL HISTORY:  Hypertension, diabetes mellitus, hyperlipidemia, MI, acid reflux, coronary artery disease, asthma, diabetes.  PAST SURGICAL HISTORY:  Three-vessel disease status post CABG.  SOCIAL HISTORY:  Nonsmoker, nondrinker.  No drug abuse.  She lives alone and is quite functional.  FAMILY HISTORY:  Positive for heart problem and diabetes mellitus in multiple family members.  REVIEW OF SYSTEM:  She denies any chest pain.  Denies any shortness of breath.  Denies any nausea, vomiting, diarrhea.  Denies any burning urination.  Denies any sputum with cough production.  Denies any recent weight loss.  She did have almost complete vision loss in the left eye few days ago.  See the details above. She denies any acute distress at this time.  Rest of 10-organ review of system unremarkable.  Review of the clinical data:  I have seen her CT angiogram that was performed today and it does not show any remarkable abnormality on the head and neck vessels with minimal 50% of the right proximal ICA. bilateral  ophthalmic arteries are also visible.  CBC is showing mild anemia.  BMP  is showing mild increase in creatinine, ESR is 30.  ALLERGIES:  The patient has no known drug allergies.  PHYSICAL EXAMINATION:  Currently lying down comfortably on bed and talking appropriately.  Her blood pressure is 152/82 with a pulse rate of 72 per minute. She is awake, oriented x3 in no acute distress.  Bilateral pupils are asymmetric.  Left side is status post cataract surgery.  The size of pupil is relatively very small but slowly reactive. The visual acuity is very limited in the left eye with blurry vision and likely the upper half of the vision is also missing including the center half and she has probably just an arc shape lower half left and that is also blurry in the left eye. The right eye does not have any visual  problem or any field deficit. Moves eyes to all direction.  Face seems symmetrical without any atrophy or fasciculation of the tongue which is midline.  Palate elevates symmetrically bilaterally. On repeated palpations and mild percussions, I do not elicit any tenderness at her temples. Motor examination reveals strength 5/5 all over, although she does have some joint pains in the left hand grip. Sensory examination, she admits to feel light touch sensation all over. The patient has mild hard of hearing which is a chronic issue. The gait was deferred.  IMPRESSION:  This is an 76 year old woman with sudden transient blurry vision in the right eye along with overall feeling weak and balance problem in the setting of high blood pressure with systolic ranges 200 plus. I think her symptoms were the result of hypertensive urgency and given the lack of any residual deficit argues against an obvious stroke. However, the possibility of a small stroke cannot be completely ruled down in such settings. Given the patient's history of retinal artery occlusion on the left side recently, I would suggest having her transesophageal echo cardiogram to see if there is any embolic source as she did not seem to have workup for this. The possibility of temporal arteritis is very less likely in this setting because there is no clues in the history nor on exam and also the objective data argues against it.  This mild elevation of ESR at this age group is very nonspecific and the patient was not taking any steroidal or nonsteroidal antiinflammatory drugs before coming into the hospital that might have caused decrease ESR in this setting.  PLAN:  Please get the patient's MRI of the brain to evaluate for possible hypertensive urgency effects and also to rule out any ischemic event, although less likely. I have discussed my impression and plan in detail with Dr. Waymon Amato and I have discussed briefly with the  patient's ophthalmologist, Dr. Luciana Axe as well.  No further neurological recommendations at this time and will proceed as the transesophageal echo and MRI show.    ______________________________ Levie Heritage, MD     WS/MEDQ  D:  10/06/2010  T:  10/07/2010  Job:  [id number]  Electronically Signed by Levie Heritage MD on 10/07/2010 04:54:09 AM

## 2010-10-08 LAB — BASIC METABOLIC PANEL
CO2: 31 mEq/L (ref 19–32)
Chloride: 103 mEq/L (ref 96–112)
Creatinine, Ser: 1.28 mg/dL — ABNORMAL HIGH (ref 0.4–1.2)
GFR calc Af Amer: 49 mL/min — ABNORMAL LOW (ref >60)
Potassium: 4.4 mEq/L (ref 3.5–5.1)

## 2010-10-08 LAB — CBC
Hemoglobin: 11.1 g/dL — ABNORMAL LOW (ref 12.0–15.0)
MCH: 32 pg (ref 26.0–34.0)
MCV: 98.3 fL (ref 78.0–100.0)
RBC: 3.47 MIL/uL — ABNORMAL LOW (ref 3.87–5.11)

## 2010-10-08 LAB — GLUCOSE, CAPILLARY: Glucose-Capillary: 125 mg/dL — ABNORMAL HIGH (ref 70–99)

## 2010-10-09 ENCOUNTER — Ambulatory Visit (HOSPITAL_COMMUNITY): Payer: Medicare Other | Attending: Ophthalmology

## 2010-10-18 NOTE — Discharge Summary (Signed)
NAMEMarland Kitchen  [name], [name] NO.:  [id number]  MEDICAL RECORD NO.:  [id number]  LOCATION:  3007                         FACILITY:  [facility]  PHYSICIAN:  Erick Blinks, MD     DATE OF BIRTH:  1927/12/14  DATE OF ADMISSION:  10/06/2010 DATE OF DISCHARGE:                        DISCHARGE SUMMARY - REFERRING   PRIMARY CARE PHYSICIAN:  Dr. Beverley Fiedler from [place].  OPHTHALMOLOGIST:  Alford Highland. Rankin, M.D.  CARDIOLOGIST:  Pamella Pert, MD  DISCHARGE DIAGNOSES: 1. Transient right-sided blurry vision, thought to be secondary to     hypertensive urgency. 2. Accelerated hypertension. 3. Type 2 diabetes. 4. Chronic kidney disease, stage III. 5. Hyperlipidemia. 6. Chronic anemia. 7. History of asthma.  DISCHARGE MEDICATIONS: 1. Aspirin 81 mg p.o. daily. 2. Prednisone 40 mg p.o. daily. 3. Losartan 25 mg p.o. daily. 4. Levemir 26 units subcutaneous at bedtime. 5. Zocor 20 mg 1 tablet p.o. at bedtime. 6. Coreg 25 mg p.o. b.i.d. 7. Hydralazine 25 mg 1 tablet p.o. t.i.d. 8. Torsemide 20 mg p.o. b.i.d. 9. Advair 250/50 inhaled b.i.d. 10.Singulair 10 mg 1 tablet p.o. at bedtime. 11.Calcium citrate plus vitamin D 1 tablet p.o. daily. 12.Norvasc 5 mg p.o. daily.  ADMISSION HISTORY:  This is an 77 year old African American female who presents to the emergency room with complaints of acute onset of right- sided blurry vision.  The patient had recently seen her ophthalmologist for decreased left-sided vision and was thought to have central retinal artery occlusion.  She was following with Dr. Luciana Axe as an outpatient with acute onset of symptoms.  She presents to the emergency room.  She was subsequently admitted for further workup.  Differential diagnoses included CVA versus temporal arteritis.  HOSPITAL COURSE: 1. Transient right-sided blurry vision.  The patient was started     empirically on steroids.  She underwent Temporal artery biopsy,     results of which  are still pending.  She had an extensive     neurologic workup including MRI of the brain as well as CT angio of     the head and neck.  Regarding the CT angio of the neck, there was     only 50% lesion on the right internal carotid artery.  Left side     did not show any significant lesions.  No acute infarct was found     on her MRI.  Her sed rate was only found to be 30 on admission.  It     was felt that her symptoms were likely secondary to uncontrolled     hypertension as her blood pressure was over 200 on admission.  We     will continue with prednisone until her biopsy results come back     negative.  This can be further followed up with her primary care     physician. 2. Generalized weakness.  The patient was seen in consultation by     Physical Therapy and recommended home health physical therapy.  We     will set this up prior to discharge. 3. Regarding the patient's vision, the patient already has an     appointment with her ophthalmologist to be seen in the office  tomorrow.  This was also communicated with Dr. Barbaraann Barthel office.  We     will also communicate with her primary care's office to further     follow up the biopsy results.  The patient was also seen by Dr.     Jacinto Halim here in the hospital who is her primary cardiologist and     underwent a transesophageal echocardiogram, which ruled out any     source of cardiac embolus.  The remainder of the patient's medical problems have remained stable and can follow up with her primary care physician for further management.  CONSULTATIONS: 1. Neurology, Dr. Hoy Morn. 2. [facility].  Dr. Lindie Spruce for temporal artery biopsy. 3. Cardiology, Dr. Jacinto Halim.  PROCEDURES:  Transesophageal echocardiogram done on June 5 shows normal LVEF, marked left ventricular hypertrophy, and moderate pulmonary hypertension to severe pulmonary hypertension.  No cardiac source of cerebral embolus.  DIAGNOSTIC IMAGING: 1. CT angio of the  neck on admission shows atherosclerosis of the     aorta and brachial cephalic vessels, but no evidence of occlusion,     dissection of advanced disease.  There is atherosclerotic change of     the carotid bifurcation on the right with maximal stenosis of the     proximal ICA is 50%.  In the left carotid bifurcation, there is no     measurable stenosis. 2. CT head on admission shows no significant finding in the     intracranial exam.  The brain itself appears normal.  There is     ordinary atherosclerotic calcification in the carotid siphon region     and affecting the vertebral arteries at the foramen magnum level,     but no suspicion of flow-limiting stenosis.  No major vessel     occlusion is present, including specific attenuation to the     ophthalmic arteries. 3. MRI of the brain on June 5 shows no acute infarct, mild-to-moderate     small vessel disease-type changes expanded partially empty sella,     the optic chiasm and optic nerves slightly inferior toward the     empty sella, minimal parasternal mucosal thickening, nonspecific     superficial subcentimeter septated cystic structure lateral aspect     of right orbit.DISCHARGE INSTRUCTIONS:  The patient should continue her activity as tolerated.  Continue on a heart-healthy low-calorie diet.  She will be set up with home health physical therapy as well as home health RN.  She has been scheduled to see Dr. Jacinto Halim in his office on June 13 at 10:45. She has also been set up for outpatient renal Dopplers to rule out any renal artery stenosis as the patient was hypertensive on June 27 at 2:00 p.m.  She will call Dr. Jillyn Hidden Rankin's office to schedule an appointment, and she will also need to follow up with the primary care physician in the next 1 week.  Plan was discussed with the patient, who is also in agreement for followup.  The patient's steroids can be discontinued once her temporal artery biopsy is negative.     Erick Blinks, MD     JM/MEDQ  D:  10/08/2010  T:  10/08/2010  Job:  [id number]  cc:   [name], M.D. Alford Highland. Rankin, M.D. Pamella Pert, MD  Electronically Signed by Erick Blinks  on 10/18/2010 03:27:39 PM

## 2010-10-21 NOTE — Op Note (Signed)
  NAMEMarland Kitchen  ALETTA, EDMUNDS NO.:  [id number]  MEDICAL RECORD NO.:  [id number]  LOCATION:  3007                         FACILITY:  [facility]  PHYSICIAN:  Cherylynn Ridges, M.D.    DATE OF BIRTH:  1927/09/03  DATE OF PROCEDURE:  10/07/2010 DATE OF DISCHARGE:                              OPERATIVE REPORT   PREOPERATIVE DIAGNOSIS:  Possible left temporal arteritis.  POSTOPERATIVE DIAGNOSIS:  Possible left temporal arteritis.  PROCEDURE:  Left temporal artery biopsy.  SURGEON:  Marta Lamas. Lindie Spruce, MD  ANESTHESIA:  Monitored anesthesia care with local anesthesia 1% Xylocaine without epinephrine.  ESTIMATED BLOOD LOSS:  Less than 10 mL.  No complications.  CONDITION:  Stable.  INDICATIONS FOR OPERATION:  The patient is an 77 year old female with some central blindness in her left eye, acute onset, possible temporal arteritis.  OPERATION:  The patient was taken to the operating room and placed on table in supine position.  After an adequate amount of IV sedation was given, she was prepped and draped in the usual sterile manner exposing the left temporal area.  After proper time-out was performed identifying the patient and the procedure to be performed, we marked the area for the biopsy, then anesthetized with a 30-gauge needle with 1% Xylocaine.  We made a longitudinal incision along the anterior part of the left ear and then dissected down using tenotomy scissors directly on top of the artery. We excised a 3-cm segment ligating it proximally and distally and also branches with 3-0 Vicryl ties.  Once this was done and hemostasis was obtained with electrocautery, then the skin was closed using running subcuticular stitch of 4-0 Monocryl.  Dermabond and Steri-Strips were used to complete the dressing.  All counts were correct.    Cherylynn Ridges, M.D.    JOW/MEDQ  D:  10/07/2010  T:  10/08/2010  Job:  [id number]  Electronically Signed by Jimmye Norman M.D. on 10/21/2010  08:09:31 AM

## 2010-10-22 ENCOUNTER — Emergency Department (HOSPITAL_COMMUNITY)
Admission: EM | Admit: 2010-10-22 | Discharge: 2010-10-22 | Disposition: A | Discharge status: Home / Self Care | Payer: Medicare Other | Attending: Emergency Medicine | Admitting: Emergency Medicine

## 2010-10-22 ENCOUNTER — Emergency Department (HOSPITAL_COMMUNITY): Payer: Medicare Other

## 2010-10-22 DIAGNOSIS — R0602 Shortness of breath: Secondary | ICD-10-CM | POA: Insufficient documentation

## 2010-10-22 DIAGNOSIS — R5381 Other malaise: Secondary | ICD-10-CM | POA: Insufficient documentation

## 2010-10-22 DIAGNOSIS — Z951 Presence of aortocoronary bypass graft: Secondary | ICD-10-CM | POA: Insufficient documentation

## 2010-10-22 DIAGNOSIS — R29898 Other symptoms and signs involving the musculoskeletal system: Secondary | ICD-10-CM | POA: Insufficient documentation

## 2010-10-22 DIAGNOSIS — J45909 Unspecified asthma, uncomplicated: Secondary | ICD-10-CM | POA: Insufficient documentation

## 2010-10-22 DIAGNOSIS — E789 Disorder of lipoprotein metabolism, unspecified: Secondary | ICD-10-CM | POA: Insufficient documentation

## 2010-10-22 DIAGNOSIS — I509 Heart failure, unspecified: Secondary | ICD-10-CM | POA: Insufficient documentation

## 2010-10-22 DIAGNOSIS — I2510 Atherosclerotic heart disease of native coronary artery without angina pectoris: Secondary | ICD-10-CM | POA: Insufficient documentation

## 2010-10-22 DIAGNOSIS — G459 Transient cerebral ischemic attack, unspecified: Secondary | ICD-10-CM | POA: Insufficient documentation

## 2010-10-22 DIAGNOSIS — Z79899 Other long term (current) drug therapy: Secondary | ICD-10-CM | POA: Insufficient documentation

## 2010-10-22 DIAGNOSIS — R209 Unspecified disturbances of skin sensation: Secondary | ICD-10-CM | POA: Insufficient documentation

## 2010-10-22 DIAGNOSIS — E119 Type 2 diabetes mellitus without complications: Secondary | ICD-10-CM | POA: Insufficient documentation

## 2010-10-22 DIAGNOSIS — I10 Essential (primary) hypertension: Secondary | ICD-10-CM | POA: Insufficient documentation

## 2010-10-22 DIAGNOSIS — K219 Gastro-esophageal reflux disease without esophagitis: Secondary | ICD-10-CM | POA: Insufficient documentation

## 2010-10-22 LAB — COMPREHENSIVE METABOLIC PANEL
ALT: 16 U/L (ref 0–35)
AST: 15 U/L (ref 0–37)
Albumin: 3.4 g/dL — ABNORMAL LOW (ref 3.5–5.2)
Alkaline Phosphatase: 67 U/L (ref 39–117)
CO2: 33 mEq/L — ABNORMAL HIGH (ref 19–32)
Chloride: 99 mEq/L (ref 96–112)
GFR calc non Af Amer: 25 mL/min — ABNORMAL LOW (ref >60)
Potassium: 4.2 mEq/L (ref 3.5–5.1)
Sodium: 140 mEq/L (ref 135–145)
Total Bilirubin: 0.8 mg/dL (ref 0.3–1.2)

## 2010-10-22 LAB — CBC
MCV: 95.8 fL (ref 78.0–100.0)
Platelets: 142 10*3/uL — ABNORMAL LOW (ref 150–400)
RBC: 3.6 MIL/uL — ABNORMAL LOW (ref 3.87–5.11)
RDW: 12.3 % (ref 11.5–15.5)
WBC: 5.4 10*3/uL (ref 4.0–10.5)

## 2010-10-22 LAB — DIFFERENTIAL
Basophils Absolute: 0 10*3/uL (ref 0.0–0.1)
Basophils Relative: 0 % (ref 0–1)
Eosinophils Absolute: 0.1 10*3/uL (ref 0.0–0.7)
Lymphs Abs: 1.4 10*3/uL (ref 0.7–4.0)
Neutrophils Relative %: 67 % (ref 43–77)

## 2010-10-22 LAB — GLUCOSE, CAPILLARY: Glucose-Capillary: 147 mg/dL — ABNORMAL HIGH (ref 70–99)

## 2010-10-26 NOTE — H&P (Signed)
NAMEMarland Richardson  SOLEY, HARRISS NO.:  [id number]  MEDICAL RECORD NO.:  [id number]  LOCATION:  3007                         FACILITY:  [facility]  PHYSICIAN:  Marcellus Scott, MD     DATE OF BIRTH:  03/20/1928  DATE OF ADMISSION:  10/06/2010 DATE OF DISCHARGE:                             HISTORY & PHYSICAL   PRIMARY CARE PHYSICIAN:  Turkey R. Rankins, MD, Deboraha Sprang  CARDIOLOGIST:  Pamella Pert, MD  CHIEF COMPLAINT:  Acute onset of blurred vision and spots in the right eye and numbness of feet.  HISTORY OF PRESENT ILLNESS:  [name] is an 77 year old African American female patient with history of type 2 diabetes mellitus, hypertension, hyperlipidemia, and coronary artery disease status post CABG who 5 days ago experienced on onset of blindness in the left eye. She was seen in the emergency department at [facility]. Subsequently, she saw the ophthalmologist 2 days later on October 03, 2010. This dictator discussed with Dr. Luciana Axe, who indicated that she has a left central retinal artery occlusion.  She is due to see him again on June 7.  This morning, she woke up at approximately 4:30 a.m. and did her chores and at about 5:30 a.m. she was preparing to read something and sitting on a chair when she suddenly experienced blurred vision on the right eye with some dark dots in the eye field.  At the same time she also experienced some numbness and cold sensation in both feet and lightheadedness.  She denies any chest pain or palpitations or dyspnea. She called her daughter who in turn called the ambulance and the patient was brought to the emergency room.  She denies any slurred speech, twisting of the mouth, or asymmetrical limb weakness.  She denies loss of consciousness.  She indicates that her vision in the right field has improved, but it is not yet at the baseline.  She also volunteers to her blood pressure is being very high at home lately.  Also in the  last 2-3 weeks she has felt fatigued and is not able to do her usual activities. Her appetite has been good.  There are no fevers or chills.  She denies headache or temporal pain.  She indicates that she has been exercising her upper extremities and feels some soreness in the shoulders for the last 2 weeks.  Once the patient presented to the emergency department, the emergency department physician evaluated her and discussed her case with Dr. Luciana Axe, who suggested that the possibilities of temporal arteritis versus embolic phenomenon.  The ED physician then proceeded to give Solu- Medrol 125 mg IV x1 per the ophthalmologist recommendation and called in a surgical consult for temporal artery biopsy.  A Neurology Consultation was also request and a CT angiogram of the head and neck was done with findings as indicated below.  The [facility] were requested to admit for further evaluation and management.  PAST MEDICAL HISTORY: 1. Diabetes type 2. 2. Hypertension. 3. Hyperlipidemia. 4. History of myocardial infarction. 5. Question bronchitis/asthma.  PAST SURGICAL HISTORY:  Coronary artery bypass graft with three-vessel disease.  ALLERGIES:  Question LIPITOR intolerance.  HOME MEDICATIONS: 1. Levemir 20  units subcutaneously daily. 2. Advair 250 - 50 one inhalation twice daily. 3. Caltrate 600 plus D +600 - 400 mg Unit 1 tablet p.o. b.i.d. 4. Lasix 40 mg p.o. b.i.d. 5. Potassium chloride CR 20 mEq p.o. b.i.d. 6. Cozaar 100 mg p.o. daily. 7. Simvastatin 20 mg p.o. nightly. 8. Carvedilol 12.5 mg p.o. b.i.d. 9. Singulair 10 mg p.o. nightly.  FAMILY HISTORY:  Strongly positive for diabetes and mother had history of heart disease.  SOCIAL HISTORY:  The patient is widowed.  She is retired.  She lives alone and is mostly independent of activities of daily living, but uses a cane at sometimes and a scooter.  Remote history of smoking.  No history of alcohol or drug  abuse.  ADVANCE DIRECTIVES:  Full code.  REVIEW OF SYSTEMS:  All systems reviewed apart from history of presenting illness is pertinent for chronic dyspnea since her open heart surgery.  PHYSICAL EXAMINATION:  GENERAL:  Ms. Detter is a moderately built and obese female patient who is in no obvious distress. VITAL SIGNS:  Temperature 97.7 degrees Fahrenheit, blood pressure was 197/82 mmHg on arrival to the ED, subsequently 153/61 mmHg, pulse 67 per minute and regular, respirations 17 per minute, and blood pressure 97% on room air. HEAD, EYE and ENT:  Nontraumatic, normocephalic.  Pupils equally reacting to light and accommodation.  Bilateral arcus senilis.  Left eye with visual field defect.  The right eye with perception of hand movements.  Extraocular muscle movements intact. NECK:  Supple.  No JVD or carotid bruit. LYMPHATICS:  No lymphadenopathy. RESPIRATORY:  Clear.  No increased work of breathing. CARDIOVASCULAR:  System first and second heart sounds heard and regular. No murmurs or JVD. ABDOMEN:  Nondistended, nontender, soft and bowel sounds present. CENTRAL NERVOUS SYSTEM:  The patient is awake, alert, oriented x3 with no cranial nerve deficits other than eye exam mentioned above. EXTREMITIES:  With grade 5/5 power and symmetrical reflexes. SKIN:  Without any rashes.  LABORATORY DATA:  ESR is 30, troponin less than 0.30.  Basic metabolic panel with BUN 38, creatinine 1.38, and glucose 73.  CBC, hemoglobin 10.9, platelet 140, and white blood cell 4.4.  CT angiogram of the neck, impression atherosclerosis of the aorta and brachiocephalic vessels, but no evidence of occlusion, dissection, or advanced disease.  There is atherosclerotic change of the carotid bifurcation on the right with a maximal stenosis of the proximal ICA is 50%.  In the left carotid bifurcation, there is no measurable stenosis, thyroid goiter.  CT angiogram of the head, impression, no significant  findings in the intracranial exam.  The brain itself appears normal.  There is ordinary atherosclerotic calcification in the carotid siphon region and affecting the vertebral arteries at the foramen magnum level, but no suspicion of flow-limiting stenosis.  No major vessel occlusion is present including specific attention to the ophthalmic artery.  EKG shows normal sinus bradycardia 56 beats per minute to with normal axis, right bundle branch block, question Q-waves in inferior leads.  ASSESSMENT AND PLAN: 1. Acute visual abnormalities bilaterally.  Differential diagnosis is     TIA versus temporal arteritis.  The patient is admitted to     telemetry.  We will cycle cardiac enzymes.  We will obtain MRI of     the head without contrast.  We will obtain 2-D echocardiogram and     may need a transesophageal echocardiogram.  The patient has passed     the bedside swallow screen.  The patient will  be placed on full-     dose aspirin.  Neurology has been consulted and indicated that her     findings are in keeping with hypertensive urgency, but to proceed     with a stroke workup including MRI and MRI of the head and TEE.     This dictator discussed with the ophthalmologist, who recommends     temporal artery biopsy and continue  prednisone until proven     otherwise for Temporal Arteritis. 2. Uncontrolled type 2 diabetes mellitus.  We will obtain hemoglobin     A1c and continue insulins. 3. Hypertension.  Continue home medications and monitor blood     pressures and mainly require titration. 4. Hyperlipidemia.  Continue simvastatin. 5. Chronic kidney disease, possibly stable.  Monitor daily BMET. 6. Anemia and mild thrombocytopenia.  Follow daily CBCs. 7. Question chronic obstructive pulmonary disease versus chronic     diastolic congestive heart failure.  Check her echo and continue     her Lasix and ARB. 8. Full code status.  Time taken in coordinating this history and physical note  is 1 hour.     Marcellus Scott, MD     AH/MEDQ  D:  10/06/2010  T:  10/06/2010  Job:  [id number]  cc:   Pamella Pert, MD [name], M.D. Alford Highland. Rankin, M.D.  Electronically Signed by Marcellus Scott MD on 10/26/2010 01:20:50 AM

## 2010-10-27 ENCOUNTER — Emergency Department (HOSPITAL_COMMUNITY)
Admission: EM | Admit: 2010-10-27 | Discharge: 2010-10-28 | Disposition: A | Discharge status: Home / Self Care | Payer: Medicare Other | Attending: Emergency Medicine | Admitting: Emergency Medicine

## 2010-10-27 DIAGNOSIS — Z8673 Personal history of transient ischemic attack (TIA), and cerebral infarction without residual deficits: Secondary | ICD-10-CM | POA: Insufficient documentation

## 2010-10-27 DIAGNOSIS — I10 Essential (primary) hypertension: Secondary | ICD-10-CM | POA: Insufficient documentation

## 2010-10-27 DIAGNOSIS — E119 Type 2 diabetes mellitus without complications: Secondary | ICD-10-CM | POA: Insufficient documentation

## 2010-10-27 DIAGNOSIS — H5710 Ocular pain, unspecified eye: Secondary | ICD-10-CM | POA: Insufficient documentation

## 2010-11-03 ENCOUNTER — Telehealth: Payer: Self-pay | Admitting: Physician Assistant

## 2010-11-03 NOTE — Telephone Encounter (Signed)
All Cardiac faxed to [facility]orah @ [phone]  11/03/10/km

## 2010-11-04 ENCOUNTER — Ambulatory Visit (HOSPITAL_COMMUNITY)
Admission: RE | Admit: 2010-11-04 | Discharge: 2010-11-04 | Disposition: A | Discharge status: Home / Self Care | Payer: Medicare Other | Source: Ambulatory Visit | Attending: Ophthalmology | Admitting: Ophthalmology

## 2010-11-04 DIAGNOSIS — Z951 Presence of aortocoronary bypass graft: Secondary | ICD-10-CM | POA: Insufficient documentation

## 2010-11-04 DIAGNOSIS — Z794 Long term (current) use of insulin: Secondary | ICD-10-CM | POA: Insufficient documentation

## 2010-11-04 DIAGNOSIS — J45909 Unspecified asthma, uncomplicated: Secondary | ICD-10-CM | POA: Insufficient documentation

## 2010-11-04 DIAGNOSIS — Z79899 Other long term (current) drug therapy: Secondary | ICD-10-CM | POA: Insufficient documentation

## 2010-11-04 DIAGNOSIS — H4089 Other specified glaucoma: Secondary | ICD-10-CM | POA: Insufficient documentation

## 2010-11-04 DIAGNOSIS — I4510 Unspecified right bundle-branch block: Secondary | ICD-10-CM | POA: Insufficient documentation

## 2010-11-04 DIAGNOSIS — E119 Type 2 diabetes mellitus without complications: Secondary | ICD-10-CM | POA: Insufficient documentation

## 2010-11-04 DIAGNOSIS — I252 Old myocardial infarction: Secondary | ICD-10-CM | POA: Insufficient documentation

## 2010-11-04 DIAGNOSIS — H409 Unspecified glaucoma: Secondary | ICD-10-CM | POA: Insufficient documentation

## 2010-11-04 DIAGNOSIS — Z01812 Encounter for preprocedural laboratory examination: Secondary | ICD-10-CM | POA: Insufficient documentation

## 2010-11-04 LAB — CBC
HCT: 34 % — ABNORMAL LOW (ref 36.0–46.0)
MCH: 32 pg (ref 26.0–34.0)
MCV: 94.7 fL (ref 78.0–100.0)
Platelets: 154 10*3/uL (ref 150–400)
RDW: 12.1 % (ref 11.5–15.5)

## 2010-11-04 LAB — BASIC METABOLIC PANEL
BUN: 36 mg/dL — ABNORMAL HIGH (ref 6–23)
CO2: 33 mEq/L — ABNORMAL HIGH (ref 19–32)
Calcium: 9.2 mg/dL (ref 8.4–10.5)
Creatinine, Ser: 1.61 mg/dL — ABNORMAL HIGH (ref 0.50–1.10)
GFR calc Af Amer: 37 mL/min — ABNORMAL LOW (ref >60)

## 2010-11-04 LAB — GLUCOSE, CAPILLARY: Glucose-Capillary: 139 mg/dL — ABNORMAL HIGH (ref 70–99)

## 2010-11-04 LAB — SURGICAL PCR SCREEN: MRSA, PCR: NEGATIVE

## 2010-11-04 NOTE — Consult Note (Signed)
NAMEMarland Kitchen  [name], [name] NO.:  [id number]  MEDICAL RECORD NO.:  [id number]  LOCATION:  3007                         FACILITY:  [facility]  PHYSICIAN:  Cherylynn Ridges, M.D.    DATE OF BIRTH:  31-Oct-1927  DATE OF CONSULTATION:  10/06/2010 DATE OF DISCHARGE:                                CONSULTATION   REFERRING PHYSICIAN:  Marcellus Scott, MD.  PRIMARY CARE PHYSICIAN:  Turkey R. Rankins, M.D.  Vanna ScotlandJillyn Hidden A. Rankin, M.D.  CARDIOLOGIST:  Pamella Pert, MD  REASON FOR CONSULTATION:  Vision lost left eye, rule out temporal arteritis.  BRIEF HISTORY:  The patient is an 77 year old African American female who reports "her eye went out last Wednesday."  This was on the left. She says she had ongoing vision loss since that time.  She apparently saw Dr. Luciana Axe last Friday, and he dilated her, but it is unclear exactly what was found.  She presented to the ER today with some tingling, cloudy vision, and some problems with her right eye and thought she was having a stroke.  She called Dr. Luciana Axe.  He send her to the ER for further evaluation and treatment, and it was his opinion that the patient might have a temporal arteritis.  We are asked to see in consult.  PAST MEDICAL HISTORY: 1. GI bleed, diverticular bleed in 2010. 2. Hypertension. 3. Coronary artery disease with MI, Promus stents to the circ and     coronary artery bypass grafting in 2008. 4. History of congestive heart failure, EF was 60% with mild MR., LA,     and RA dilatation.  Pulmonary hypertension, Sep 13, 2008 by 2-D     echo. 5. Dyslipidemia. 6. Adult-onset diabetes mellitus, insulin dependent. 7. History of asthma. 8. History of renal insufficiency. 9. Carotid disease.  Last carotid Doppler per Dr. Myrtis Ser note showed 60-     79% right ICA stenosis and 40-59% left ICA stenosis. 10.Morbid obesity.  Height 62 inches, weight 228 pounds. 11.History of mild anxiety.  PAST SURGICAL  HISTORY:  Patient only reports coronary artery bypass grafting.  FAMILY HISTORY:  Father died with diabetes and complications including leg amputation.  Mother died with coronary artery disease and diabetes. No brothers or sisters.  SOCIAL HISTORY:  She smoked for 10-15 years, none for the last 40. Alcohol, none.  Drugs, none.  The patient lives alone.  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  REVIEW OF SYSTEMS:  CONSTITUTIONAL:  Fever none.  Weight changes none. SKIN:  No rashes or changes.  PSYCH:  No changes.  CV:  She has a headache now, but has not had problems with headache.  No dizziness, syncope, presyncope, stroke, or seizure.  PULMONARY:  She has dyspnea on exertion.  No PND.  No orthopnea.  She reports being short of breath with exertion since her surgery.  No recent coughing or wheezing.  No asthma symptoms.  No recent URI.  She does complain of stuffy nose. CARDIAC:  She denies any chest pain or palpitations.  GI:  Positive for occasional GERD.  No nausea, vomiting, diarrhea, constipation, or blood. GU:  No trouble voiding.  No odor or discomfort.  LOWER EXTREMITIES: She has transient, but fairly frequent lower extremity edema.  She complains of claudication-like symptoms in her hips and says that tired easily.  MUSCULOSKELETAL:  She has arthritis however and says it varies in position.  MEDICATIONS:  The complete list is unavailable, note from the ER list: Advair, Coreg, hydralazine, and Levemir insulin.  ALLERGIES:  No medications.  She tries to avoid NONSTEROIDAL ANTI- INFLAMMATORIES.  She says it cause her GI bleed.  She refuses to take ASPIRIN.  PHYSICAL EXAMINATION:  GENERAL:  This is an elderly 77 year old African American female who is currently sitting up and eating lunch. VITAL SIGNS:  Her blood pressure on admission to the ER was 197/82, heart rate was 70, temperature was 97.9.  Currently, her temperature is 98.6, heart rate is 79,  last blood pressure is 204/80, respiratory rate is 20, sats 93% on room air. HEENT:  Head:  Normocephalic.  She has no tenderness on palpation.  No tenderness over the temporal areas.  No pain in her neck.  Ears, nose, throat and mouth are essentially normal. NECK:  Trachea is midline.  There is no bruits.  No JVD.  No palpable thyromegaly. CHEST:  Nontender to palpation.  Clear to auscultation and percussion. No wheezes or rales. CARDIAC:  Normal S1, split S2.  No murmurs or rubs auscultated.  Pulses are +2 and equal in the upper extremity.  Distal pulses are +1. ABDOMEN:  Soft, nontender.  Positive bowel sounds.  No palpable hepatosplenomegaly.  No hernias, abscesses, or masses. GU/RECTAL:  Deferred. LYMPHADENOPATHY:  None palpated in neck and femoral areas. MUSCULOSKELETAL:  No acute abnormalities noted. SKIN:  No changes. NEUROLOGIC:  She has vision in her right eye, although it is not quite as clear she would like she can read the calendar which is about 6 feet away a large font print.  She can read the number, nothing else.  Left eye, when asked what she could see.  She describes a green block in front of her, i was in green scrubs; and then she says it is black.  She cannot identify anything with her left eye.  Cranial nerves are otherwise grossly intact.  There are no focal deficits.  She is moving all extremities. Strength is equal bilaterally in upper and lower extremities. PSYCH:  Normal affect.  LABORATORY DATA:  ESR is 30.  Troponin is less than 0.3.  Sodium is 140, potassium is 4.1, chloride is 99, CO2 is 23, BUN is 38, creatinine is 1.38.  White count is 4.4, hemoglobin 10.9, hematocrit 32.9, platelets 140.  CT angio shows arteriosclerosis, but no occlusion.  The right ICA bifurcation has 50% stenosis, but is otherwise patent.  No other labs or radiology data is currently available.  IMPRESSION: 1. Vision loss, left eye, Oct 01, 2010, asked to see for temporal      artery biopsy. 2. Uncontrolled hypertension.  Blood pressure is 197/82 on admission. 3. History of 40-59% left ICA stenosis and 60-79% right ICA stenosis     by Dopplers last hospitalization. 4. Coronary artery disease with myocardial infarction, coronary artery     bypass graft, and stents in 2008.  Ejection fraction 60%, mild     mitral regurgitation Sep 13, 2008; history of congestive heart     failure. 5. Adult-onset diabetes mellitus, insulin dependent. 6. History renal insufficiency. 7. History of GI bleed. 8. History of asthma. 9. Obesity. 10.History of tobacco use.  PLAN:  This has  been discussed with Dr. Lindie Spruce.  At this point, he decided to make her n.p.o. for possible temporal artery biopsy in a.m. Further workup and evaluation as indicated.     Eber Hong, P.A.   ______________________________ Cherylynn Ridges, M.D.    WDJ/MEDQ  D:  10/06/2010  T:  10/06/2010  Job:  [id number]  cc:   Marcellus Scott, MD [name], M.D. Alford Highland. Rankin, M.D. Pamella Pert, MD  Electronically Signed by Sherrie George P.A. on 10/21/2010 06:34:09 PM Electronically Signed by Jimmye Norman M.D. on 11/04/2010 08:14:20 AM

## 2010-11-05 NOTE — Consult Note (Signed)
NAME:  [name], [name] NO.:  [id number]  MEDICAL RECORD NO.:  [id number]  LOCATION:  [facility]                         FACILITY:  [facility]  PHYSICIAN:  Dr. Thad Ranger           DATE OF BIRTH:  1928-03-07  DATE OF CONSULTATION:  10/22/2010 DATE OF DISCHARGE:  10/22/2010                                CONSULTATION   REASON FOR CONSULTATION:  Transient left arm and leg weakness.  HISTORY OF PRESENT ILLNESS:  This is a pleasant 77 year old female with past medical history of hypertension, diabetes, hypercholesterolemia, MI, GERD, CAD, asthma, CABG, decreased vision.  The patient states that she awoke this morning and noted some decreased capabilities moving her left arm and leg.  She states that this weakness resolved after approximately 10 minutes and has now fully resolved, and she is back to her normal baseline.  The patient denies any headache, numbness, tingling, vertigo, diplopia, blurred vision with this episode.  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  She states that she has been placed on Plavix in the past secondary to heart issues, however, due to this, she developed a GI bleed.  The patient at that time was switched to aspirin; however, she has not been taking her aspirin on a daily basis in fear of developing another GI bleed.  PAST MEDICAL HISTORY:  As noted above.  MEDICATIONS:  Aspirin, which she has not been taking, prednisone, losartan, Levemir, Zocor, Coreg, hydralazine, torsemide, Advair, Singulair, Norvasc.  ALLERGIES:  No known drug allergies.  FAMILY HISTORY:  CAD.  SOCIAL HISTORY:  The patient does not smoke, drink, or do illicit drugs. She lives alone.  REVIEW OF SYSTEMS:  Negative with the exception of above.  PHYSICAL EXAMINATION:  VITAL SIGNS:  Blood pressure is 140/76, pulse of 82,  respirations 16, temperature 98.3. MENTAL STATUS:  She is alert, oriented x3.  Carries out 2-3 steps commands without difficulty.  Pupils are equal, round, and reactive to light, and accommodating.  Conjugate gaze.  Extraocular movements are intact.  She does have decreased vision in her left eye significantly in all visual fields.  In her right eye, she sees dots, but is able to tell me, which fingers I am holding up in all visual fields.  The patient's face is symmetrical.  Tongue is midline.  Uvula is midline.  The patient shows no dysarthria, aphasia, or slurred speech.  The patient's sensation, V1-V3 is bilaterally intact.  Sensation grossly intact to pinprick, light touch throughout. COORDINATION:  Finger-to-nose, heel-to-shin were smooth. MOTOR:  The patient has 5/5 strength throughout.  Deep tendon reflexes were depressed throughout with downgoing toes bilaterally. DRIFT:  The patient showed no drift in the upper or lower extremities. PULMONARY:  Clear to auscultation bilaterally. CARDIOVASCULAR:  S1 and S2 are audible.  No murmurs. NECK:  Negative for bruits.  LABORATORY DATA:  Sodium is 140, potassium 4.2, chloride 99, CO2 of 33, BUN 52, creatinine 1.52, glucose is 140.  White blood cell count 5.4, platelets 142, hemoglobin 11.6, hematocrit 34.5.  IMAGING:  The patient has recently had a full workup on October 06, 2010, which included a CTA of neck, which did show 50% stenosis of the right ICA at the bifurcation.  No ICA stenosis on the left.  CTA of head, which was negative throughout.  MRI of head, which was negative.  TEE on October 07, 2010, which was again negative.  ASSESSMENT:This is an 77 year old female with transient left arm and leg     weakness, now fully resolved.  There is a possibility that this     could represent transient ischemic attack versus stroke.  PLAN: 1. MRI of head to rule out stroke. 2. Recommend placing the patient on 81 mg of enteric-coated aspirin      daily for stroke prevention. 3. Continue Zocor. 4. Continue with secondary stroke prevention.  No further     recommendations at this time as we are waiting MRI results.  Dr. Thad Ranger has seen and evaluated the patient, agrees with the above.     Felicie Morn, PA-C   ______________________________ Dr. Thad Ranger    DS/MEDQ  D:  10/22/2010  T:  10/23/2010  Job:  [id number]  Electronically Signed by Felicie Morn PA-C on 10/27/2010 01:16:57 PM Electronically Signed by Thana Farr MD on 11/05/2010 12:18:50 PM

## 2010-11-07 ENCOUNTER — Emergency Department (HOSPITAL_COMMUNITY)
Admission: EM | Admit: 2010-11-07 | Discharge: 2010-11-07 | Disposition: A | Discharge status: Home / Self Care | Payer: Medicare Other | Attending: Emergency Medicine | Admitting: Emergency Medicine

## 2010-11-07 DIAGNOSIS — G5790 Unspecified mononeuropathy of unspecified lower limb: Secondary | ICD-10-CM | POA: Insufficient documentation

## 2010-11-07 DIAGNOSIS — Z951 Presence of aortocoronary bypass graft: Secondary | ICD-10-CM | POA: Insufficient documentation

## 2010-11-07 DIAGNOSIS — Z79899 Other long term (current) drug therapy: Secondary | ICD-10-CM | POA: Insufficient documentation

## 2010-11-07 DIAGNOSIS — E119 Type 2 diabetes mellitus without complications: Secondary | ICD-10-CM | POA: Insufficient documentation

## 2010-11-07 DIAGNOSIS — I10 Essential (primary) hypertension: Secondary | ICD-10-CM | POA: Insufficient documentation

## 2011-01-29 LAB — DIFFERENTIAL
Eosinophils Absolute: 0.2
Eosinophils Relative: 3
Lymphs Abs: 1.9
Monocytes Relative: 10

## 2011-01-29 LAB — CBC
HCT: 33.5 — ABNORMAL LOW
MCV: 95.8
Platelets: 171
WBC: 5.7

## 2011-01-29 LAB — BASIC METABOLIC PANEL
BUN: 52 — ABNORMAL HIGH
Chloride: 105
Potassium: 5.2 — ABNORMAL HIGH

## 2011-01-29 LAB — PROTIME-INR: Prothrombin Time: 14.2

## 2011-02-02 LAB — GLUCOSE, CAPILLARY: Glucose-Capillary: 114 — ABNORMAL HIGH

## 2011-02-04 LAB — GLUCOSE, CAPILLARY
Glucose-Capillary: 135 — ABNORMAL HIGH
Glucose-Capillary: 143 — ABNORMAL HIGH
Glucose-Capillary: 171 — ABNORMAL HIGH

## 2011-02-10 LAB — POCT I-STAT 3, ART BLOOD GAS (G3+)
Acid-Base Excess: 1
Bicarbonate: 25.9 — ABNORMAL HIGH
Bicarbonate: 26.9 — ABNORMAL HIGH
Bicarbonate: 27.9 — ABNORMAL HIGH
Bicarbonate: 28.2 — ABNORMAL HIGH
Bicarbonate: 29.5 — ABNORMAL HIGH
O2 Saturation: 97
Operator id: 196291
Operator id: 296031
Operator id: 3342
Operator id: 3342
Patient temperature: 97.8
TCO2: 27
TCO2: 28
TCO2: 28
TCO2: 29
TCO2: 31
pCO2 arterial: 38.1
pCO2 arterial: 38.2
pCO2 arterial: 39.2
pCO2 arterial: 49.1 — ABNORMAL HIGH
pCO2 arterial: 49.5 — ABNORMAL HIGH
pH, Arterial: 7.303 — ABNORMAL LOW
pH, Arterial: 7.336 — ABNORMAL LOW
pH, Arterial: 7.346 — ABNORMAL LOW
pH, Arterial: 7.415 — ABNORMAL HIGH
pH, Arterial: 7.445 — ABNORMAL HIGH
pH, Arterial: 7.47 — ABNORMAL HIGH
pO2, Arterial: 100
pO2, Arterial: 317 — ABNORMAL HIGH
pO2, Arterial: 61 — ABNORMAL LOW
pO2, Arterial: 81
pO2, Arterial: 84

## 2011-02-10 LAB — CBC
HCT: 21.6 — ABNORMAL LOW
HCT: 23.2 — ABNORMAL LOW
HCT: 24 — ABNORMAL LOW
HCT: 27.3 — ABNORMAL LOW
HCT: 24.8 — ABNORMAL LOW
HCT: 25.8 — ABNORMAL LOW
HCT: 28.1 — ABNORMAL LOW
HCT: 28.7 — ABNORMAL LOW
HCT: 31.4 — ABNORMAL LOW
HCT: 32.2 — ABNORMAL LOW
Hemoglobin: 7.2 — CL
Hemoglobin: 7.7 — CL
Hemoglobin: 8.1 — ABNORMAL LOW
Hemoglobin: 9.3 — ABNORMAL LOW
Hemoglobin: 10.6 — ABNORMAL LOW
Hemoglobin: 10.9 — ABNORMAL LOW
Hemoglobin: 8.4 — ABNORMAL LOW
Hemoglobin: 8.7 — ABNORMAL LOW
Hemoglobin: 9.1 — ABNORMAL LOW
Hemoglobin: 9.5 — ABNORMAL LOW
Hemoglobin: 9.6 — ABNORMAL LOW
MCHC: 33.1
MCHC: 33.3
MCHC: 33.8
MCHC: 33.9
MCHC: 33.6
MCHC: 33.6
MCHC: 33.7
MCHC: 33.7
MCHC: 33.8
MCHC: 33.9
MCV: 93.2
MCV: 93.4
MCV: 94.2
MCV: 94.8
MCV: 94.9
MCV: 93.3
MCV: 93.5
MCV: 93.9
MCV: 94.2
MCV: 94.2
MCV: 94.6
MCV: 95.1
Platelets: 119 — ABNORMAL LOW
Platelets: 155
Platelets: 93 — ABNORMAL LOW
Platelets: 96 — ABNORMAL LOW
Platelets: 108 — ABNORMAL LOW
Platelets: 132 — ABNORMAL LOW
Platelets: 163
Platelets: 165
Platelets: 99 — ABNORMAL LOW
RBC: 2.28 — ABNORMAL LOW
RBC: 2.44 — ABNORMAL LOW
RBC: 2.57 — ABNORMAL LOW
RBC: 2.84 — ABNORMAL LOW
RBC: 2.9 — ABNORMAL LOW
RBC: 2.66 — ABNORMAL LOW
RBC: 2.74 — ABNORMAL LOW
RBC: 2.87 — ABNORMAL LOW
RBC: 2.99 — ABNORMAL LOW
RBC: 3.32 — ABNORMAL LOW
RBC: 3.45 — ABNORMAL LOW
RDW: 13.3
RDW: 13.5
RDW: 13.6
RDW: 13.7
RDW: 12.2
RDW: 13
RDW: 13.1
RDW: 13.7
WBC: 11.3 — ABNORMAL HIGH
WBC: 13.9 — ABNORMAL HIGH
WBC: 6.7
WBC: 6.9
WBC: 8
WBC: 10.3
WBC: 11.5 — ABNORMAL HIGH
WBC: 14.4 — ABNORMAL HIGH
WBC: 4.9
WBC: 6.4

## 2011-02-10 LAB — BASIC METABOLIC PANEL
BUN: 25 — ABNORMAL HIGH
BUN: 35 — ABNORMAL HIGH
BUN: 38 — ABNORMAL HIGH
BUN: 42 — ABNORMAL HIGH
BUN: 44 — ABNORMAL HIGH
BUN: 49 — ABNORMAL HIGH
BUN: 20
BUN: 35 — ABNORMAL HIGH
CO2: 31
CO2: 31
CO2: 32
CO2: 32
CO2: 33 — ABNORMAL HIGH
CO2: 28
CO2: 28
Calcium: 8.3 — ABNORMAL LOW
Calcium: 8.5
Calcium: 8.6
Calcium: 8.7
Calcium: 8.9
Calcium: 8.2 — ABNORMAL LOW
Calcium: 8.9
Chloride: 100
Chloride: 103
Chloride: 95 — ABNORMAL LOW
Chloride: 98
Chloride: 99
Chloride: 99
Chloride: 103
Chloride: 108
Creatinine, Ser: 1.32 — ABNORMAL HIGH
Creatinine, Ser: 1.37 — ABNORMAL HIGH
Creatinine, Ser: 1.49 — ABNORMAL HIGH
Creatinine, Ser: 1.52 — ABNORMAL HIGH
Creatinine, Ser: 1.58 — ABNORMAL HIGH
Creatinine, Ser: 1.24 — ABNORMAL HIGH
Creatinine, Ser: 1.25 — ABNORMAL HIGH
GFR calc Af Amer: 38 — ABNORMAL LOW
GFR calc Af Amer: 40 — ABNORMAL LOW
GFR calc Af Amer: 41 — ABNORMAL LOW
GFR calc Af Amer: 44 — ABNORMAL LOW
GFR calc Af Amer: 45 — ABNORMAL LOW
GFR calc Af Amer: 47 — ABNORMAL LOW
GFR calc Af Amer: 50 — ABNORMAL LOW
GFR calc Af Amer: 51 — ABNORMAL LOW
GFR calc non Af Amer: 32 — ABNORMAL LOW
GFR calc non Af Amer: 33 — ABNORMAL LOW
GFR calc non Af Amer: 34 — ABNORMAL LOW
GFR calc non Af Amer: 36 — ABNORMAL LOW
GFR calc non Af Amer: 37 — ABNORMAL LOW
GFR calc non Af Amer: 39 — ABNORMAL LOW
GFR calc non Af Amer: 42 — ABNORMAL LOW
GFR calc non Af Amer: 42 — ABNORMAL LOW
Glucose, Bld: 101 — ABNORMAL HIGH
Glucose, Bld: 123 — ABNORMAL HIGH
Glucose, Bld: 162 — ABNORMAL HIGH
Glucose, Bld: 72
Glucose, Bld: 90
Glucose, Bld: 154 — ABNORMAL HIGH
Glucose, Bld: 98
Potassium: 4.4
Potassium: 4.5
Potassium: 4.5
Potassium: 4.5
Potassium: 4.5
Potassium: 4.6
Potassium: 4.8
Potassium: 5.4 — ABNORMAL HIGH
Sodium: 134 — ABNORMAL LOW
Sodium: 135
Sodium: 136
Sodium: 138
Sodium: 139
Sodium: 138
Sodium: 139

## 2011-02-10 LAB — CK TOTAL AND CKMB (NOT AT ARMC)
CK, MB: 3.4
Relative Index: 1.7

## 2011-02-10 LAB — URINALYSIS, ROUTINE W REFLEX MICROSCOPIC
Bilirubin Urine: NEGATIVE
Ketones, ur: NEGATIVE
Nitrite: NEGATIVE
Protein, ur: NEGATIVE
Urobilinogen, UA: 0.2
pH: 6

## 2011-02-10 LAB — PROTIME-INR
INR: 1
INR: 1.1
INR: 1.4
Prothrombin Time: 13.8
Prothrombin Time: 17.2 — ABNORMAL HIGH

## 2011-02-10 LAB — CROSSMATCH
ABO/RH(D): A POS
Antibody Screen: NEGATIVE

## 2011-02-10 LAB — POCT I-STAT 4, (NA,K, GLUC, HGB,HCT)
Glucose, Bld: 129 — ABNORMAL HIGH
Glucose, Bld: 142 — ABNORMAL HIGH
Glucose, Bld: 147 — ABNORMAL HIGH
HCT: 21 — ABNORMAL LOW
HCT: 24 — ABNORMAL LOW
HCT: 32 — ABNORMAL LOW
Hemoglobin: 10.9 — ABNORMAL LOW
Hemoglobin: 7.1 — CL
Operator id: 3342
Operator id: 3342
Operator id: 3342
Operator id: 3342
Potassium: 3.9
Potassium: 4.3
Sodium: 131 — ABNORMAL LOW
Sodium: 136
Sodium: 137
Sodium: 138

## 2011-02-10 LAB — MAGNESIUM
Magnesium: 2.1
Magnesium: 2.1
Magnesium: 2.3

## 2011-02-10 LAB — CARDIAC PANEL(CRET KIN+CKTOT+MB+TROPI)
CK, MB: 2.5
CK, MB: 2.8
Relative Index: 1.7
Relative Index: 1.8
Total CK: 143
Total CK: 155
Troponin I: 0.04
Troponin I: 0.05

## 2011-02-10 LAB — POCT I-STAT 3, VENOUS BLOOD GAS (G3P V)
Acid-Base Excess: 1
Bicarbonate: 26 — ABNORMAL HIGH
O2 Saturation: 88
pCO2, Ven: 41.1 — ABNORMAL LOW
pO2, Ven: 55 — ABNORMAL HIGH

## 2011-02-10 LAB — APTT
aPTT: 35
aPTT: 39 — ABNORMAL HIGH

## 2011-02-10 LAB — EXPECTORATED SPUTUM ASSESSMENT W GRAM STAIN, RFLX TO RESP C

## 2011-02-10 LAB — I-STAT 8, (EC8 V) (CONVERTED LAB)
BUN: 42 — ABNORMAL HIGH
Bicarbonate: 28.8 — ABNORMAL HIGH
Chloride: 105
Glucose, Bld: 111 — ABNORMAL HIGH
HCT: 33 — ABNORMAL LOW
Hemoglobin: 8.5 — ABNORMAL LOW
Potassium: 4.8
Sodium: 139
TCO2: 31
pCO2, Ven: 52 — ABNORMAL HIGH
pCO2, Ven: 65.1 — ABNORMAL HIGH
pH, Ven: 7.255
pH, Ven: 7.341 — ABNORMAL HIGH

## 2011-02-10 LAB — COMPREHENSIVE METABOLIC PANEL
ALT: 17
AST: 22
Albumin: 3 — ABNORMAL LOW
Albumin: 3.7
Alkaline Phosphatase: 75
Alkaline Phosphatase: 96
BUN: 26 — ABNORMAL HIGH
BUN: 38 — ABNORMAL HIGH
CO2: 32
Chloride: 102
Chloride: 104
Creatinine, Ser: 1.23 — ABNORMAL HIGH
GFR calc non Af Amer: 42 — ABNORMAL LOW
Glucose, Bld: 127 — ABNORMAL HIGH
Potassium: 4.4
Potassium: 5
Sodium: 139
Total Bilirubin: 0.5
Total Bilirubin: 0.5
Total Protein: 7.2

## 2011-02-10 LAB — HEMOGLOBIN AND HEMATOCRIT, BLOOD: Hemoglobin: 8.3 — ABNORMAL LOW

## 2011-02-10 LAB — I-STAT EC8
Bicarbonate: 26.1 — ABNORMAL HIGH
Chloride: 103
HCT: 26 — ABNORMAL LOW
pCO2 arterial: 46.8 — ABNORMAL HIGH
pH, Arterial: 7.355

## 2011-02-10 LAB — PLATELET COUNT: Platelets: 103 — ABNORMAL LOW

## 2011-02-10 LAB — URINE MICROSCOPIC-ADD ON

## 2011-02-10 LAB — DIFFERENTIAL
Basophils Relative: 1
Eosinophils Absolute: 0.2
Eosinophils Relative: 4
Eosinophils Relative: 4
Lymphocytes Relative: 35
Lymphs Abs: 1.7
Monocytes Absolute: 0.3
Monocytes Relative: 10
Neutro Abs: 2.6
Neutrophils Relative %: 50

## 2011-02-10 LAB — POCT CARDIAC MARKERS
CKMB, poc: 2.8
Myoglobin, poc: 238

## 2011-02-10 LAB — HEPARIN LEVEL (UNFRACTIONATED)
Heparin Unfractionated: 0.76 — ABNORMAL HIGH
Heparin Unfractionated: <0.1 — ABNORMAL LOW

## 2011-02-10 LAB — CULTURE, RESPIRATORY W GRAM STAIN

## 2011-02-10 LAB — PREPARE PLATELET PHERESIS

## 2011-02-10 LAB — POCT I-STAT GLUCOSE
Glucose, Bld: 159 — ABNORMAL HIGH
Glucose, Bld: 160 — ABNORMAL HIGH
Operator id: 3342

## 2011-02-10 LAB — CREATININE, SERUM
Creatinine, Ser: 1.67 — ABNORMAL HIGH
GFR calc Af Amer: 36 — ABNORMAL LOW
GFR calc Af Amer: >60
GFR calc non Af Amer: 30 — ABNORMAL LOW

## 2011-02-10 LAB — HEMOGLOBIN A1C: Hgb A1c MFr Bld: 5.3

## 2011-02-10 LAB — TROPONIN I: Troponin I: 0.06

## 2011-02-10 LAB — HEPATITIS C VIRUS, RIBA: HCV Antibodies-RIBA: NEGATIVE

## 2011-02-10 LAB — TSH: TSH: 0.909

## 2011-02-10 LAB — HEPATITIS B SURFACE ANTIGEN: Hepatitis B Surface Ag: NEGATIVE

## 2011-02-10 LAB — LIPID PANEL: VLDL: 18

## 2011-02-10 LAB — POCT I-STAT CREATININE
Creatinine, Ser: 1.4 — ABNORMAL HIGH
Operator id: 234501

## 2011-02-10 LAB — HEPATITIS C ANTIBODY, REFLEX: HCV Ab: POSITIVE — AB

## 2011-02-13 LAB — CARDIAC PANEL(CRET KIN+CKTOT+MB+TROPI)
Relative Index: 2
Total CK: 179 — ABNORMAL HIGH
Troponin I: 0.32 — ABNORMAL HIGH
Troponin I: 0.33 — ABNORMAL HIGH

## 2011-02-13 LAB — BASIC METABOLIC PANEL
BUN: 33 — ABNORMAL HIGH
CO2: 29
CO2: 29
CO2: 30
Calcium: 8.8
Calcium: 9
Chloride: 105
Chloride: 105
Creatinine, Ser: 1.27 — ABNORMAL HIGH
Creatinine, Ser: 1.27 — ABNORMAL HIGH
GFR calc Af Amer: 50 — ABNORMAL LOW
GFR calc non Af Amer: 41 — ABNORMAL LOW
GFR calc non Af Amer: 42 — ABNORMAL LOW
Glucose, Bld: 112 — ABNORMAL HIGH
Glucose, Bld: 142 — ABNORMAL HIGH
Glucose, Bld: 97
Potassium: 4.6
Potassium: 4.8
Potassium: 5
Sodium: 139
Sodium: 140

## 2011-02-13 LAB — CBC
HCT: 29.3 — ABNORMAL LOW
HCT: 29.9 — ABNORMAL LOW
HCT: 31.6 — ABNORMAL LOW
Hemoglobin: 10.1 — ABNORMAL LOW
Hemoglobin: 9.4 — ABNORMAL LOW
Hemoglobin: 9.9 — ABNORMAL LOW
MCHC: 33.5
MCHC: 33.7
MCHC: 33.9
MCV: 94.3
MCV: 94.8
MCV: 95
Platelets: 142 — ABNORMAL LOW
Platelets: 146 — ABNORMAL LOW
Platelets: 150
RBC: 2.95 — ABNORMAL LOW
RBC: 3 — ABNORMAL LOW
RBC: 3.09 — ABNORMAL LOW
RDW: 11.8
RDW: 12
RDW: 12
WBC: 4.7
WBC: 4.8
WBC: 5
WBC: 5.1

## 2011-02-13 LAB — COMPREHENSIVE METABOLIC PANEL
ALT: 16
AST: 16
Albumin: 3 — ABNORMAL LOW
CO2: 27
Calcium: 8.5
Chloride: 105
GFR calc Af Amer: 44 — ABNORMAL LOW
GFR calc non Af Amer: 36 — ABNORMAL LOW
Sodium: 137

## 2011-02-13 LAB — TSH: TSH: 0.782

## 2011-02-13 LAB — PROTIME-INR: Prothrombin Time: 14.9

## 2011-02-13 LAB — LIPID PANEL
HDL: 33 — ABNORMAL LOW
Total CHOL/HDL Ratio: 5
Triglycerides: 100

## 2011-02-13 LAB — APTT: aPTT: 129 — ABNORMAL HIGH

## 2011-02-13 NOTE — Op Note (Signed)
NAME:  [name], [name] NO.:  [id number]  MEDICAL RECORD NO.:  [id number]  LOCATION:  [facility]                         FACILITY:  [facility]  PHYSICIAN:  Alford Highland. Cass Edinger, M.D.   DATE OF BIRTH:  [birth year]  DATE OF PROCEDURE:  11/07/2010 DATE OF DISCHARGE:  11/07/2010                              OPERATIVE REPORT   PREOPERATIVE DIAGNOSIS:  Neovascular glaucoma, left eye.  POSTOPERATIVE DIAGNOSIS:  Neovascular glaucoma, left eye.  PROCEDURE: 1. Insertion of glaucoma seton - aqueous shunt - left eye - anterior     chamber - Ahmed valve. 2. Scleral patch graft using Tutoplast to cover superotemporal     quadrant of the seton on entry into the anterior chamber.  SURGEON:  Alford Highland. Kayliee Atienza, MD  ANESTHESIA:  General endotracheal anesthesia.  INDICATIONS FOR PROCEDURE:  The patient is a 77 year old woman who has had a profound inflammatory uveitic glaucoma developed which is now unresponsive to topical medical therapy for glaucoma control.  The patient understands this is an attempt to salvage the globe eye lowering the intraocular pressure so as to allow for best visual functioning. She understands the risk of anesthesia, rare occurrence of death, loss of the eye including but not limited to hemorrhage, infection, scarring, need for another surgery, no change in vision, loss of vision, and progressive disease despite intervention.  Appropriate signed consent was obtained.  The patient was taken to the operating room.  In the operating room appropriate monitors followed by mild sedation.  General endotracheal anesthesia was instituted without difficulty.  Left periocular region was then identified as the operative site with entire operative staff, thereafter proceeding with sterile prep and drape in the usual ophthalmic fashion.  Lid speculum was applied.  Conjunctival peritomy was then fashioned from the 11:30 position to the 3:30 position.  The lateral and superior rectus  muscles were isolated on 2-0 silk ties.  Ahmed valve was selected and placed in superotemporal quadrant.  The footplate was secured with 8-0 nylon sutures to the sclera.  Anterior chamber was then approached with a scleral tunnel near the limbus.  The anterior chamber was then opened beneath the scleral tunnel with a 26-gauge needle and the anterior chamber was deepened with a small amount of viscoelastic and air.  The tube was cut to appropriate size.  The Ahmed valve had been primed.  The tube was then placed through the scleral tunnel into the anterior chamber at appropriate length.  Scleral patch graft was then attached at the limbus with 7-0 Vicryl sutures for protection near the limbus with a conjunctival erosion.  No complications occurred.  BSS was injected via second paracentesis site, appeared to be flowing freely into the tube.  At this time, the conjunctiva was then brought forward and closed over the tube and the valve.  No complications occurred.  Subconjunctival Decadron applied inferiorly.  A sterile patch and Fox shield applied.  The patient tolerated the procedure without complications, taken to the PACU.     Alford Highland Charna Neeb, M.D.     GAR/MEDQ  D:  12/10/2010  T:  12/11/2010  Job:  [id number]  Electronically Signed by Fawn Kirk M.D. on 02/13/2011 05:06:20 PM

## 2011-02-16 LAB — POCT I-STAT CREATININE: Creatinine, Ser: 1.3 — ABNORMAL HIGH

## 2011-02-16 LAB — I-STAT 8, (EC8 V) (CONVERTED LAB)
Chloride: 107
HCT: 32 — ABNORMAL LOW
Hemoglobin: 10.9 — ABNORMAL LOW
Operator id: 161631
pCO2, Ven: 50.6 — ABNORMAL HIGH

## 2011-02-16 LAB — POCT CARDIAC MARKERS: CKMB, poc: 3.1

## 2011-08-26 ENCOUNTER — Emergency Department (HOSPITAL_COMMUNITY): Payer: Medicare Other

## 2011-08-26 ENCOUNTER — Encounter (HOSPITAL_COMMUNITY): Payer: Self-pay | Admitting: *Deleted

## 2011-08-26 ENCOUNTER — Inpatient Hospital Stay (HOSPITAL_COMMUNITY): Payer: Medicare Other

## 2011-08-26 ENCOUNTER — Inpatient Hospital Stay (HOSPITAL_COMMUNITY)
Admission: EM | Admit: 2011-08-26 | Discharge: 2011-08-30 | DRG: 093 | Disposition: A | Discharge status: Home / Self Care | Payer: Medicare Other | Attending: Internal Medicine | Admitting: Internal Medicine

## 2011-08-26 DIAGNOSIS — I2510 Atherosclerotic heart disease of native coronary artery without angina pectoris: Secondary | ICD-10-CM | POA: Diagnosis present

## 2011-08-26 DIAGNOSIS — Z8673 Personal history of transient ischemic attack (TIA), and cerebral infarction without residual deficits: Secondary | ICD-10-CM

## 2011-08-26 DIAGNOSIS — N189 Chronic kidney disease, unspecified: Secondary | ICD-10-CM | POA: Diagnosis present

## 2011-08-26 DIAGNOSIS — T448X5A Adverse effect of centrally-acting and adrenergic-neuron-blocking agents, initial encounter: Secondary | ICD-10-CM | POA: Diagnosis present

## 2011-08-26 DIAGNOSIS — R42 Dizziness and giddiness: Secondary | ICD-10-CM | POA: Diagnosis present

## 2011-08-26 DIAGNOSIS — R001 Bradycardia, unspecified: Secondary | ICD-10-CM | POA: Diagnosis present

## 2011-08-26 DIAGNOSIS — I10 Essential (primary) hypertension: Secondary | ICD-10-CM | POA: Diagnosis present

## 2011-08-26 DIAGNOSIS — R209 Unspecified disturbances of skin sensation: Principal | ICD-10-CM | POA: Diagnosis present

## 2011-08-26 DIAGNOSIS — Z794 Long term (current) use of insulin: Secondary | ICD-10-CM

## 2011-08-26 DIAGNOSIS — I498 Other specified cardiac arrhythmias: Secondary | ICD-10-CM | POA: Diagnosis present

## 2011-08-26 DIAGNOSIS — Z87891 Personal history of nicotine dependence: Secondary | ICD-10-CM

## 2011-08-26 DIAGNOSIS — R202 Paresthesia of skin: Secondary | ICD-10-CM | POA: Diagnosis present

## 2011-08-26 DIAGNOSIS — J45909 Unspecified asthma, uncomplicated: Secondary | ICD-10-CM | POA: Diagnosis present

## 2011-08-26 DIAGNOSIS — Z951 Presence of aortocoronary bypass graft: Secondary | ICD-10-CM

## 2011-08-26 DIAGNOSIS — E785 Hyperlipidemia, unspecified: Secondary | ICD-10-CM | POA: Diagnosis present

## 2011-08-26 DIAGNOSIS — Z7982 Long term (current) use of aspirin: Secondary | ICD-10-CM

## 2011-08-26 DIAGNOSIS — R200 Anesthesia of skin: Secondary | ICD-10-CM | POA: Diagnosis present

## 2011-08-26 DIAGNOSIS — E119 Type 2 diabetes mellitus without complications: Secondary | ICD-10-CM | POA: Diagnosis present

## 2011-08-26 DIAGNOSIS — I129 Hypertensive chronic kidney disease with stage 1 through stage 4 chronic kidney disease, or unspecified chronic kidney disease: Secondary | ICD-10-CM | POA: Diagnosis present

## 2011-08-26 DIAGNOSIS — H5440 Blindness, one eye, unspecified eye: Secondary | ICD-10-CM | POA: Diagnosis present

## 2011-08-26 HISTORY — DX: Unspecified osteoarthritis, unspecified site: M19.90

## 2011-08-26 HISTORY — DX: Atherosclerotic heart disease of native coronary artery without angina pectoris: I25.10

## 2011-08-26 LAB — CBC
HCT: 33.9 % — ABNORMAL LOW (ref 36.0–46.0)
Hemoglobin: 11.3 g/dL — ABNORMAL LOW (ref 12.0–15.0)
MCH: 31.6 pg (ref 26.0–34.0)
MCHC: 32.7 g/dL (ref 30.0–36.0)
MCV: 96 fL (ref 78.0–100.0)
Platelets: 137 10*3/uL — ABNORMAL LOW (ref 150–400)
RBC: 3.51 MIL/uL — ABNORMAL LOW (ref 3.87–5.11)
RBC: 3.53 MIL/uL — ABNORMAL LOW (ref 3.87–5.11)
WBC: 4.5 10*3/uL (ref 4.0–10.5)

## 2011-08-26 LAB — URINALYSIS, ROUTINE W REFLEX MICROSCOPIC
Bilirubin Urine: NEGATIVE
Glucose, UA: NEGATIVE mg/dL
Hgb urine dipstick: NEGATIVE
Specific Gravity, Urine: 1.013 (ref 1.005–1.030)

## 2011-08-26 LAB — GLUCOSE, CAPILLARY
Glucose-Capillary: 68 mg/dL — ABNORMAL LOW (ref 70–99)
Glucose-Capillary: 82 mg/dL (ref 70–99)
Glucose-Capillary: 115 mg/dL — ABNORMAL HIGH (ref 70–99)

## 2011-08-26 LAB — BASIC METABOLIC PANEL
GFR calc Af Amer: 43 mL/min — ABNORMAL LOW (ref >90)
GFR calc non Af Amer: 37 mL/min — ABNORMAL LOW (ref >90)
Potassium: 4 mEq/L (ref 3.5–5.1)
Sodium: 141 mEq/L (ref 135–145)

## 2011-08-26 LAB — URINE MICROSCOPIC-ADD ON

## 2011-08-26 LAB — DIFFERENTIAL
Basophils Absolute: 0 10*3/uL (ref 0.0–0.1)
Basophils Relative: 0 % (ref 0–1)
Eosinophils Absolute: 0.1 10*3/uL (ref 0.0–0.7)
Lymphs Abs: 1.1 10*3/uL (ref 0.7–4.0)
Neutrophils Relative %: 64 % (ref 43–77)

## 2011-08-26 LAB — CREATININE, SERUM: GFR calc Af Amer: 43 mL/min — ABNORMAL LOW (ref >90)

## 2011-08-26 MED ORDER — SODIUM CHLORIDE 0.9 % IV SOLN
INTRAVENOUS | Status: DC
  Administered 2011-08-26: 11:00:00 via INTRAVENOUS

## 2011-08-26 MED ORDER — LOSARTAN POTASSIUM 50 MG PO TABS
50.0000 mg | ORAL_TABLET | Freq: Every day | ORAL | Status: DC
  Administered 2011-08-26 – 2011-08-27 (×2): 50 mg via ORAL
  Filled 2011-08-26 (×2): qty 1

## 2011-08-26 MED ORDER — ASPIRIN 300 MG RE SUPP
300.0000 mg | Freq: Every day | RECTAL | Status: DC
  Filled 2011-08-26 (×5): qty 1

## 2011-08-26 MED ORDER — TORSEMIDE 20 MG PO TABS
20.0000 mg | ORAL_TABLET | Freq: Two times a day (BID) | ORAL | Status: DC
  Administered 2011-08-26 – 2011-08-30 (×8): 20 mg via ORAL
  Filled 2011-08-26 (×10): qty 1

## 2011-08-26 MED ORDER — INSULIN DETEMIR 100 UNIT/ML ~~LOC~~ SOLN
26.0000 [IU] | Freq: Every morning | SUBCUTANEOUS | Status: DC
  Administered 2011-08-26 – 2011-08-30 (×5): 26 [IU] via SUBCUTANEOUS
  Filled 2011-08-26: qty 10

## 2011-08-26 MED ORDER — HYDRALAZINE HCL 25 MG PO TABS
25.0000 mg | ORAL_TABLET | Freq: Three times a day (TID) | ORAL | Status: DC
  Administered 2011-08-26 – 2011-08-27 (×2): 25 mg via ORAL
  Filled 2011-08-26 (×5): qty 1

## 2011-08-26 MED ORDER — ASPIRIN 325 MG PO TABS
325.0000 mg | ORAL_TABLET | Freq: Every day | ORAL | Status: DC
  Administered 2011-08-26 – 2011-08-30 (×4): 325 mg via ORAL
  Filled 2011-08-26 (×5): qty 1

## 2011-08-26 MED ORDER — HYDRALAZINE HCL 20 MG/ML IJ SOLN
10.0000 mg | Freq: Four times a day (QID) | INTRAMUSCULAR | Status: DC | PRN
  Filled 2011-08-26: qty 0.5

## 2011-08-26 MED ORDER — ENOXAPARIN SODIUM 40 MG/0.4ML ~~LOC~~ SOLN
40.0000 mg | SUBCUTANEOUS | Status: DC
  Administered 2011-08-26 – 2011-08-29 (×4): 40 mg via SUBCUTANEOUS
  Filled 2011-08-26 (×5): qty 0.4

## 2011-08-26 MED ORDER — ONDANSETRON HCL 4 MG/2ML IJ SOLN: 4.0000 mg | Freq: Four times a day (QID) | INTRAMUSCULAR | Status: DC | PRN

## 2011-08-26 MED ORDER — SENNOSIDES-DOCUSATE SODIUM 8.6-50 MG PO TABS
1.0000 | ORAL_TABLET | Freq: Every evening | ORAL | Status: DC | PRN
  Filled 2011-08-26: qty 1

## 2011-08-26 MED ORDER — SODIUM CHLORIDE 0.9 % IV SOLN
INTRAVENOUS | Status: DC
  Administered 2011-08-27: 50 mL via INTRAVENOUS
  Administered 2011-08-27 – 2011-08-28 (×2): via INTRAVENOUS

## 2011-08-26 MED ORDER — INSULIN ASPART 100 UNIT/ML ~~LOC~~ SOLN
0.0000 [IU] | Freq: Three times a day (TID) | SUBCUTANEOUS | Status: DC
  Administered 2011-08-27 – 2011-08-29 (×4): 1 [IU] via SUBCUTANEOUS

## 2011-08-26 MED ORDER — FLUTICASONE-SALMETEROL 250-50 MCG/DOSE IN AEPB
1.0000 | INHALATION_SPRAY | Freq: Two times a day (BID) | RESPIRATORY_TRACT | Status: DC
  Administered 2011-08-27 – 2011-08-30 (×6): 1 via RESPIRATORY_TRACT
  Filled 2011-08-26: qty 14

## 2011-08-26 MED ORDER — CARVEDILOL 12.5 MG PO TABS
12.5000 mg | ORAL_TABLET | Freq: Two times a day (BID) | ORAL | Status: DC
  Administered 2011-08-26 – 2011-08-27 (×2): 12.5 mg via ORAL
  Filled 2011-08-26 (×4): qty 1

## 2011-08-26 NOTE — H&P (Signed)
PATIENT DETAILS Name: [name] Age: 78 y.o. Sex: female Date of Birth: 1927/10/22 Admit Date: 08/26/2011 ZOX:WRUEAVW,UJWJXBJY, MD, MD   CHIEF COMPLAINT:  Right-sided tingling and numbness since the patient woke up this a.m.   HPI: Patient is 78 year old black female with a past medical history of CVA, hypertension, diabetes, history of coronary artery disease status post CABG, history of blindness in her left eye who presents to the ED for the above-noted complaints. The patient she has been having intermittent right-sided tingling and numbness for the past few days however when she woke up this morning it was especially worse. She claimed it was also involving her right face area as well. She also claims to have intermittent speech difficulty-however during this interview her speech was mostly fluent at times it was interrupted. She claims to have numbness and tingling in her right arm and right leg, however during this interview she claimed that this was slightly better. She claims that she normally walks with a walker. She denies any obvious slurred speech or visual problems. She denies any chest pain. She claims to have chronic intermittent exertional dyspnea at times. She denies any nausea vomiting. She denies any abdominal pain. The hospitalist service was asked to admit this patient for further evaluation and treatment given her complicated past medical history. She was also found to have a significantly elevated blood pressureon admission.   ALLERGIES:   Allergies  Allergen Reactions  . Nsaids     REACTION: renal insufficiency    PAST MEDICAL HISTORY: Past Medical History  Diagnosis Date  . Hypertension   . Diabetes mellitus   . Asthma   . Hx of CABG   . Seizures   . Coronary artery disease   . Stroke     TIA    . Shortness of breath   . Arthritis     PAST SURGICAL HISTORY: Past Surgical History  Procedure Date  . Eye surgery     MEDICATIONS AT HOME: Prior  to Admission medications   Medication Sig Start Date End Date Taking? Authorizing Provider  aspirin EC 81 MG tablet Take 81 mg by mouth daily.   Yes Historical Provider, MD  carvedilol (COREG) 25 MG tablet Take 12.5 mg by mouth 2 (two) times daily with a meal.   Yes Historical Provider, MD  Fluticasone-Salmeterol (ADVAIR) 250-50 MCG/DOSE AEPB Inhale 1 puff into the lungs every 12 (twelve) hours.   Yes Historical Provider, MD  hydrALAZINE (APRESOLINE) 25 MG tablet Take 25 mg by mouth 3 (three) times daily.   Yes Historical Provider, MD  insulin detemir (LEVEMIR) 100 UNIT/ML injection Inject 26 Units into the skin every morning.   Yes Historical Provider, MD  losartan (COZAAR) 50 MG tablet Take 50 mg by mouth daily.   Yes Historical Provider, MD  torsemide (DEMADEX) 20 MG tablet Take 20 mg by mouth 2 (two) times daily. Morning and at 3pm   Yes Historical Provider, MD    FAMILY HISTORY: History reviewed. No pertinent family history.  SOCIAL HISTORY:  reports that she has quit smoking. She has never used smokeless tobacco. She reports that she does not drink alcohol or use illicit drugs.  REVIEW OF SYSTEMS:  Constitutional:   No  weight loss, night sweats,  Fevers, chills, fatigue.  HEENT:    No headaches, Difficulty swallowing,Tooth/dental problems,Sore throat,  No sneezing, itching, ear ache, nasal congestion, post nasal drip,   Cardio-vascular: No chest pain,  Orthopnea, PND, swelling in lower extremities, anasarca,  dizziness, palpitations  GI:  No heartburn, indigestion, abdominal pain, nausea, vomiting, diarrhea, change in  bowel habits, loss of appetite  Resp: No shortness of breath with exertion or at rest.  No excess mucus, no productive cough, No non-productive cough,  No coughing up of blood.No change in color of mucus.No wheezing.No chest wall deformity  Skin:  no rash or lesions.  GU:  no dysuria, change in color of urine, no urgency or frequency.  No flank  pain.  Musculoskeletal: No joint pain or swelling.  No decreased range of motion.  No back pain.  Psych: No change in mood or affect. No depression or anxiety.  No memory loss.   PHYSICAL EXAM: Blood pressure 227/87, pulse 76, temperature 98.2 F (36.8 C), temperature source Oral, resp. rate 18, SpO2 95.00%.  General appearance :Awake, alert, not in any distress. Speech Clear. Not toxic Looking HEENT: Atraumatic and Normocephalic, pupils equally reactive to light and accomodation Neck: supple, no JVD. No cervical lymphadenopathy.  Chest:Good air entry bilaterally, no added sounds  CVS: S1 S2 regular, no murmurs.  Abdomen: Bowel sounds present, Non tender and not distended with no gaurding, rigidity or rebound. Extremities: B/L Lower Ext shows no edema, both legs are warm to touch, with  dorsalis pedis pulses palpable. Neurology: Awake alert, and oriented X 3, CN II-XII intact, Non focal,  Skin:No Rash Wounds:N/A  LABS ON ADMISSION:   Basename 08/26/11 1418 08/26/11 0852  NA -- 141  K -- 4.0  CL -- 100  CO2 -- 30  GLUCOSE -- 96  BUN -- 38*  CREATININE 1.30* 1.31*  CALCIUM -- 9.6  MG -- --  PHOS -- --   No results found for this basename: AST:2,ALT:2,ALKPHOS:2,BILITOT:2,PROT:2,ALBUMIN:2 in the last 72 hours No results found for this basename: LIPASE:2,AMYLASE:2 in the last 72 hours  Basename 08/26/11 1418 08/26/11 0852  WBC 4.5 4.3  NEUTROABS -- 2.8  HGB 11.3* 11.1*  HCT 33.9* 33.9*  MCV 96.0 96.6  PLT 145* 137*   No results found for this basename: CKTOTAL:3,CKMB:3,CKMBINDEX:3,TROPONINI:3 in the last 72 hours No results found for this basename: DDIMER:2 in the last 72 hours No components found with this basename: POCBNP:3   RADIOLOGIC STUDIES ON ADMISSION: Dg Chest 2 View  08/26/2011  *RADIOLOGY REPORT*  Clinical Data: 79 year old female with intermittent chest pain, shortness of breath.  CHEST - 2 VIEW  Comparison: 08/26/2011 and earlier.  Findings: Upright AP  and lateral views of the chest. Stable cardiomegaly and mediastinal contours.  Stable sequelae of CABG. Stable vascular congestion without overt edema.  No pneumothorax, effusion or consolidation. Stable visualized osseous structures.  IMPRESSION: Stable cardiomegaly and vascular congestion without acute pulmonary edema.  Original Report Authenticated By: Harley Hallmark, M.D.   Dg Chest 2 View  08/26/2011  *RADIOLOGY REPORT*  Clinical Data: Stroke.  CHEST - 2 VIEW  Comparison: 10/22/2010  Findings: The cardiopericardial silhouette is enlarged. Interstitial markings are diffusely coarsened with chronic features. Bones are diffusely demineralized. Telemetry leads overlie the chest.  IMPRESSION: Stable.  Cardiomegaly with underlying chronic interstitial lung disease.  Original Report Authenticated By: ERIC A. MANSELL, M.D.   Ct Head Wo Contrast  08/26/2011  *RADIOLOGY REPORT*  Clinical Data: Left arm tingling.  Right buttock numbness.  CT HEAD WITHOUT CONTRAST  Technique:  Contiguous axial images were obtained from the base of the skull through the vertex without contrast.  Comparison: 10/22/2010  Findings: The brain shows generalized atrophy.  There are mild chronic small vessel changes  within the deep white matter.  No sign of acute infarction, mass lesion, hemorrhage, hydrocephalus or extra-axial collection.  There is atherosclerotic calcification of the major vessels at the base of the brain.  The calvarium is unremarkable.  Visualized sinuses, middle ears and mastoids are clear.  Chronic empty sella incidentally noted.  IMPRESSION: Age related atrophy and mild chronic small vessel change.  No acute finding.  Original Report Authenticated By: Thomasenia Sales, M.D.    ASSESSMENT AND PLAN: Present on Admission:  1. Right-sided tingling and numbness -Given her complicated history, we'll need to consider CVA -However she has no obvious focal deficits on exam -We'll place on aspirin, commenced CVA  workup. -Neurology already has been consulted, we will await further evaluation from them.  2. Uncontrolled hypertension -We'll resume her usual antihypertensive medications -Will use as needed hydralazine -Monitor her BP trend and adjust medications as necessary  3. Dyslipidemia -Not in any statins as an outpatient -We'll check a lipid panel, and place on statins if appropriate  4. Diabetes -Place on Levemir, and monitor her CBGs.  5.History of coronary artery disease -Status post CABG. -On aspirin   Further plan will depend as patient's clinical course evolves and further radiologic and laboratory data become available. Patient will be monitored closely.  DVT Prophylaxis: Lovenox  Code Status: Full Code  Total time spent for admission equals 45 minutes.  Jeoffrey Massed 08/26/2011, 5:31 PM

## 2011-08-26 NOTE — ED Notes (Signed)
Attempted to call report to [phone], awaiting return call from RN

## 2011-08-26 NOTE — Progress Notes (Signed)
*  PRELIMINARY RESULTS* Vascular Ultrasound Carotid Duplex (Doppler) has been completed.   No evidence of right internal carotid artery stenosis. Left internal carotid artery reveals elevated velocities in the 40-59% range without evidence of plaque morphology. Bilateral antegrade vertebral artery flow.  Malachy Moan, RDMS, RDCS 08/26/2011, 4:31 PM

## 2011-08-26 NOTE — ED Notes (Signed)
Pt arrived by [facility] for left arm tingling and right buttock numbness intermittent x 5 days. Hypertensive pta 220/100.

## 2011-08-26 NOTE — Progress Notes (Signed)
Utilization review complete 

## 2011-08-26 NOTE — ED Notes (Signed)
Attempted iv access x 2 with no success, paging iv team

## 2011-08-26 NOTE — Consult Note (Signed)
Chief Complaint: "intermittent right foot tingling and intermittent right face numbness"  HPI: [name] is an 78 y.o. female who has multiple risk factors for stroke who complains of a variety of symptoms. These include intermittent right foot tingling, intermittent right face numbness. Vertigo on and off for 6 months. She told Dr. Jerral Ralph that she had some speech difficulties as well.   LSN: n/a tPA Given: No: symptoms resolved mRankin: 0  Past Medical History  Diagnosis Date  . Hypertension   . Diabetes mellitus   . Asthma   . Hx of CABG   . Seizures   . Coronary artery disease   . Stroke     TIA    . Shortness of breath   . Arthritis    Past Surgical History  Procedure Date  . Eye surgery    History reviewed. No pertinent family history. Social History:  reports that she has quit smoking. She has never used smokeless tobacco. She reports that she does not drink alcohol or use illicit drugs.  Allergies:  Allergies  Allergen Reactions  . Nsaids     REACTION: renal insufficiency   Medications: I have reviewed the patient's current medications.  ROS: As above  Physical Examination: Blood pressure 154/59, pulse 57, temperature 98.3 F (36.8 C), temperature source Oral, resp. rate 18, height 5\' 2"  (1.575 m), weight 102.1 kg (225 lb 1.4 oz), SpO2 97.00%.  Neurologic Examination: MS: AAO*3, no aphasia, followed complex commands CN: EOMI, PERRL, VFF, no facial asymmetry, tongue midline, sensation of V1 through V3 is intact b/l Motor: no drift, 5/5 strength in all extremities Sensory: no deficit to LT Coord: F to N intact b/l Reflexes: 1+ in UE, trace in KJ, absent in AJ, plantars mute bilaterally Gait: deferred  Results for orders placed during the hospital encounter of 08/26/11 (from the past 48 hour(s))  CBC     Status: Abnormal   Collection Time   08/26/11  8:52 AM      Component Value Range Comment   WBC 4.3  4.0 - 10.5 (K/uL)    RBC 3.51 (*) 3.87 -  5.11 (MIL/uL)    Hemoglobin 11.1 (*) 12.0 - 15.0 (g/dL)    HCT 16.1 (*) 09.6 - 46.0 (%)    MCV 96.6  78.0 - 100.0 (fL)    MCH 31.6  26.0 - 34.0 (pg)    MCHC 32.7  30.0 - 36.0 (g/dL)    RDW 04.5  40.9 - 81.1 (%)    Platelets 137 (*) 150 - 400 (K/uL)   DIFFERENTIAL     Status: Normal   Collection Time   08/26/11  8:52 AM      Component Value Range Comment   Neutrophils Relative 64  43 - 77 (%)    Neutro Abs 2.8  1.7 - 7.7 (K/uL)    Lymphocytes Relative 26  12 - 46 (%)    Lymphs Abs 1.1  0.7 - 4.0 (K/uL)    Monocytes Relative 7  3 - 12 (%)    Monocytes Absolute 0.3  0.1 - 1.0 (K/uL)    Eosinophils Relative 3  0 - 5 (%)    Eosinophils Absolute 0.1  0.0 - 0.7 (K/uL)    Basophils Relative 0  0 - 1 (%)    Basophils Absolute 0.0  0.0 - 0.1 (K/uL)   BASIC METABOLIC PANEL     Status: Abnormal   Collection Time   08/26/11  8:52 AM  Component Value Range Comment   Sodium 141  135 - 145 (mEq/L)    Potassium 4.0  3.5 - 5.1 (mEq/L)    Chloride 100  96 - 112 (mEq/L)    CO2 30  19 - 32 (mEq/L)    Glucose, Bld 96  70 - 99 (mg/dL)    BUN 38 (*) 6 - 23 (mg/dL)    Creatinine, Ser 1.61 (*) 0.50 - 1.10 (mg/dL)    Calcium 9.6  8.4 - 10.5 (mg/dL)    GFR calc non Af Amer 37 (*) >90 (mL/min)    GFR calc Af Amer 43 (*) >90 (mL/min)   URINALYSIS, ROUTINE W REFLEX MICROSCOPIC     Status: Abnormal   Collection Time   08/26/11  9:28 AM      Component Value Range Comment   Color, Urine YELLOW  YELLOW     APPearance CLEAR  CLEAR     Specific Gravity, Urine 1.013  1.005 - 1.030     pH 7.0  5.0 - 8.0     Glucose, UA NEGATIVE  NEGATIVE (mg/dL)    Hgb urine dipstick NEGATIVE  NEGATIVE     Bilirubin Urine NEGATIVE  NEGATIVE     Ketones, ur NEGATIVE  NEGATIVE (mg/dL)    Protein, ur NEGATIVE  NEGATIVE (mg/dL)    Urobilinogen, UA 1.0  0.0 - 1.0 (mg/dL)    Nitrite NEGATIVE  NEGATIVE     Leukocytes, UA TRACE (*) NEGATIVE    URINE MICROSCOPIC-ADD ON     Status: Abnormal   Collection Time   08/26/11  9:28 AM       Component Value Range Comment   Squamous Epithelial / LPF FEW (*) RARE     WBC, UA 0-2  <3 (WBC/hpf)    Bacteria, UA RARE  RARE    CBC     Status: Abnormal   Collection Time   08/26/11  2:18 PM      Component Value Range Comment   WBC 4.5  4.0 - 10.5 (K/uL)    RBC 3.53 (*) 3.87 - 5.11 (MIL/uL)    Hemoglobin 11.3 (*) 12.0 - 15.0 (g/dL)    HCT 09.6 (*) 04.5 - 46.0 (%)    MCV 96.0  78.0 - 100.0 (fL)    MCH 32.0  26.0 - 34.0 (pg)    MCHC 33.3  30.0 - 36.0 (g/dL)    RDW 40.9  81.1 - 91.4 (%)    Platelets 145 (*) 150 - 400 (K/uL)   CREATININE, SERUM     Status: Abnormal   Collection Time   08/26/11  2:18 PM      Component Value Range Comment   Creatinine, Ser 1.30 (*) 0.50 - 1.10 (mg/dL)    GFR calc non Af Amer 37 (*) >90 (mL/min)    GFR calc Af Amer 43 (*) >90 (mL/min)   GLUCOSE, CAPILLARY     Status: Abnormal   Collection Time   08/26/11  5:28 PM      Component Value Range Comment   Glucose-Capillary 68 (*) 70 - 99 (mg/dL)    Dg Chest 2 View  7/82/9562  *RADIOLOGY REPORT*  Clinical Data: 78 year old female with intermittent chest pain, shortness of breath.  CHEST - 2 VIEW  Comparison: 08/26/2011 and earlier.  Findings: Upright AP and lateral views of the chest. Stable cardiomegaly and mediastinal contours.  Stable sequelae of CABG. Stable vascular congestion without overt edema.  No pneumothorax, effusion or consolidation. Stable visualized osseous structures.  IMPRESSION:  Stable cardiomegaly and vascular congestion without acute pulmonary edema.  Original Report Authenticated By: Harley Hallmark, M.D.   Dg Chest 2 View  08/26/2011  *RADIOLOGY REPORT*  Clinical Data: Stroke.  CHEST - 2 VIEW  Comparison: 10/22/2010  Findings: The cardiopericardial silhouette is enlarged. Interstitial markings are diffusely coarsened with chronic features. Bones are diffusely demineralized. Telemetry leads overlie the chest.  IMPRESSION: Stable.  Cardiomegaly with underlying chronic interstitial lung  disease.  Original Report Authenticated By: ERIC A. MANSELL, M.D.   Ct Head Wo Contrast  08/26/2011  *RADIOLOGY REPORT*  Clinical Data: Left arm tingling.  Right buttock numbness.  CT HEAD WITHOUT CONTRAST  Technique:  Contiguous axial images were obtained from the base of the skull through the vertex without contrast.  Comparison: 10/22/2010  Findings: The brain shows generalized atrophy.  There are mild chronic small vessel changes within the deep white matter.  No sign of acute infarction, mass lesion, hemorrhage, hydrocephalus or extra-axial collection.  There is atherosclerotic calcification of the major vessels at the base of the brain.  The calvarium is unremarkable.  Visualized sinuses, middle ears and mastoids are clear.  Chronic empty sella incidentally noted.  IMPRESSION: Age related atrophy and mild chronic small vessel change.  No acute finding.  Original Report Authenticated By: Thomasenia Sales, M.D.   Mr Brain Wo Contrast  08/26/2011  *RADIOLOGY REPORT*  Clinical Data:  Persistent dizziness for 6 months.  Diabetic. Hypertensive.  Seizures.  MRI HEAD WITHOUT CONTRAST MRA HEAD WITHOUT CONTRAST  Technique: Multiplanar, multiecho pulse sequences of the brain and surrounding structures were obtained according to standard protocol without intravenous contrast.  Angiographic images of the head were obtained using MRA technique without contrast.  Comparison: 08/26/2011 CT.  10/22/2010 MR.  MRI HEAD  Findings:  No acute infarct.  No intracranial hemorrhage.  Remote small infarct superior right cerebellum.  Moderate small vessel disease type changes.  Global atrophy without hydrocephalus.  No intracranial mass lesion detected on this unenhanced exam.  Mild mucosal thickening ethmoid sinus air cells.  Empty sella once again noted.  Mild spinal stenosis of the cervical spine.  Left orbital implants in place.  IMPRESSION: No acute infarct.  Please see above.  MRA HEAD  Findings: Irregular broad-based bulge  medial aspect of the right internal carotid artery cavernous segment may be related to atherosclerotic type changes although small aneurysm not entirely excluded.  Mild narrowing and ectasia involving portions of the left internal carotid artery cavernous and supraclinoid segment.  Ectatic distal vertical cervical segment of the left internal carotid artery.  Mild middle cerebral artery branch vessel irregularity.  Right vertebral artery is slightly dominant size.  Mild narrowing distal left vertebral artery.  Nonvisualization right PICA.  Mild to moderate narrowing involving portions of the left PICA.  Mild irregularity with minimal narrowing proximal and distal aspect of the basilar artery without high-grade stenosis.  Mild narrowing proximal right posterior cerebral artery.  IMPRESSION: Irregular broad-based bulge medial aspect of the right internal carotid artery cavernous segment may be related to atherosclerotic type changes although small aneurysm not entirely excluded.  Mild intracranial atherosclerotic type changes otherwise noted as detailed above.  Original Report Authenticated By: Fuller Canada, M.D.   Mr Mra Head/brain Wo Cm  08/26/2011  *RADIOLOGY REPORT*  Clinical Data:  Persistent dizziness for 6 months.  Diabetic. Hypertensive.  Seizures.  MRI HEAD WITHOUT CONTRAST MRA HEAD WITHOUT CONTRAST  Technique: Multiplanar, multiecho pulse sequences of the brain and surrounding structures were  obtained according to standard protocol without intravenous contrast.  Angiographic images of the head were obtained using MRA technique without contrast.  Comparison: 08/26/2011 CT.  10/22/2010 MR.  MRI HEAD  Findings:  No acute infarct.  No intracranial hemorrhage.  Remote small infarct superior right cerebellum.  Moderate small vessel disease type changes.  Global atrophy without hydrocephalus.  No intracranial mass lesion detected on this unenhanced exam.  Mild mucosal thickening ethmoid sinus air cells.   Empty sella once again noted.  Mild spinal stenosis of the cervical spine.  Left orbital implants in place.  IMPRESSION: No acute infarct.  Please see above.  MRA HEAD  Findings: Irregular broad-based bulge medial aspect of the right internal carotid artery cavernous segment may be related to atherosclerotic type changes although small aneurysm not entirely excluded.  Mild narrowing and ectasia involving portions of the left internal carotid artery cavernous and supraclinoid segment.  Ectatic distal vertical cervical segment of the left internal carotid artery.  Mild middle cerebral artery branch vessel irregularity.  Right vertebral artery is slightly dominant size.  Mild narrowing distal left vertebral artery.  Nonvisualization right PICA.  Mild to moderate narrowing involving portions of the left PICA.  Mild irregularity with minimal narrowing proximal and distal aspect of the basilar artery without high-grade stenosis.  Mild narrowing proximal right posterior cerebral artery.  IMPRESSION: Irregular broad-based bulge medial aspect of the right internal carotid artery cavernous segment may be related to atherosclerotic type changes although small aneurysm not entirely excluded.  Mild intracranial atherosclerotic type changes otherwise noted as detailed above.  Original Report Authenticated By: Fuller Canada, M.D.   Assessment: 78 y.o. female with intermittent right foot tingling, intermittent right face numbness, vertigo on and off for 6 months and some possible speech difficulties. I think foot tingling is likely diabetes-related and vertigo is likely of peripheral origin. I did not get history of speech difficulties. The patient is a vasculopath and doing a stroke work-up is likely prudent.   Stroke Risk Factors - none  Plan: 1. HgbA1c, fasting lipid panel 2. MRI, MRA  of the brain without contrast 3. PT consult, OT consult, Speech consult 4. Echocardiogram 5. Carotid dopplers 6. Prophylactic  therapy-Antiplatelet med: Aspirin - dose 325 mg PO daily 7. Risk factor modification 8. Cardiac monitoring 9. Meclizine 25 mg PO q6h prn vertigo  Eila Runyan 08/26/2011, 10:08 PM

## 2011-08-26 NOTE — ED Notes (Signed)
Iv team at bedside  

## 2011-08-26 NOTE — ED Provider Notes (Signed)
History     CSN: [id number]  Arrival date & time 08/26/11  3086   First MD Initiated Contact with Patient 08/26/11 0813      Chief Complaint  Patient presents with  . Numbness    (Consider location/radiation/quality/duration/timing/severity/associated sxs/prior treatment) The history is provided by the patient.   patient states that she woke up with a numbness in her right face and right arm. She states there was some tingling. States it is today. She states she's not had trouble moving the arm. She states she's had a previous mini stroke that decreased her vision in her left eye. She also states she has numbness in her bilateral feet. She states the numbness in her feet alternate back and forth. No headache. She's tearful on my examination. Per EMS her blood pressure was reportedly 220/100. No chest pain. No difficulty moving her right arm. She states when he raised her arms it wasn't week.  Past Medical History  Diagnosis Date  . Hypertension   . Diabetes mellitus   . Asthma   . Hx of CABG   . Seizures   . Coronary artery disease   . Stroke     TIA    . Shortness of breath   . Arthritis     Past Surgical History  Procedure Date  . Eye surgery     History reviewed. No pertinent family history.  History  Substance Use Topics  . Smoking status: Former Games developer  . Smokeless tobacco: Never Used  . Alcohol Use: No    OB History    Grav Para Term Preterm Abortions TAB SAB Ect Mult Living                  Review of Systems  Constitutional: Negative for activity change, appetite change and fatigue.  HENT: Negative for neck stiffness.   Eyes: Negative for pain.  Respiratory: Negative for chest tightness and shortness of breath.   Cardiovascular: Negative for chest pain and leg swelling.  Gastrointestinal: Negative for nausea, vomiting, abdominal pain and diarrhea.  Genitourinary: Negative for flank pain.  Musculoskeletal: Negative for back pain.  Skin: Negative for  rash.  Neurological: Positive for weakness and numbness.  Psychiatric/Behavioral: Negative for behavioral problems.    Allergies  Nsaids  Home Medications   No current outpatient prescriptions on file.  BP 141/53  Pulse 64  Temp(Src) 97.5 F (36.4 C) (Oral)  Resp 16  Ht 5\' 2"  (1.575 m)  Wt 225 lb 1.4 oz (102.1 kg)  BMI 41.17 kg/m2  SpO2 95%  Physical Exam  Nursing note and vitals reviewed. Constitutional: She is oriented to person, place, and time. She appears well-developed and well-nourished.  HENT:  Head: Normocephalic and atraumatic.  Eyes: EOM are normal. Pupils are equal, round, and reactive to light.  Neck: Normal range of motion. Neck supple.       No tenderness over cervical spine her right trapezius.  Cardiovascular: Normal rate, regular rhythm and normal heart sounds.   No murmur heard. Pulmonary/Chest: Effort normal and breath sounds normal. No respiratory distress. She has no wheezes. She has no rales.  Abdominal: Soft. Bowel sounds are normal. She exhibits no distension. There is no tenderness. There is no rebound and no guarding.  Musculoskeletal: Normal range of motion. She exhibits edema.       Bilateral lower extremity pitting edema.  Neurological: She is alert and oriented to person, place, and time. No cranial nerve deficit.  Paresthesias to right upper arm. Sensation and strength intact in right hand and symmetric left. Strength and sensation intact in bilateral lower extremities also. Face is symmetric. No facial numbness. Extraocular movements intact. Possible weakness in raising her right arm at deltoid.  Skin: Skin is warm and dry.  Psychiatric: She has a normal mood and affect. Her speech is normal.    ED Course  Procedures (including critical care time)  Labs Reviewed  CBC - Abnormal; Notable for the following:    RBC 3.51 (*)    Hemoglobin 11.1 (*)    HCT 33.9 (*)    Platelets 137 (*)    All other components within normal limits    BASIC METABOLIC PANEL - Abnormal; Notable for the following:    BUN 38 (*)    Creatinine, Ser 1.31 (*)    GFR calc non Af Amer 37 (*)    GFR calc Af Amer 43 (*)    All other components within normal limits  URINALYSIS, ROUTINE W REFLEX MICROSCOPIC - Abnormal; Notable for the following:    Leukocytes, UA TRACE (*)    All other components within normal limits  URINE MICROSCOPIC-ADD ON - Abnormal; Notable for the following:    Squamous Epithelial / LPF FEW (*)    All other components within normal limits  CBC - Abnormal; Notable for the following:    RBC 3.53 (*)    Hemoglobin 11.3 (*)    HCT 33.9 (*)    Platelets 145 (*)    All other components within normal limits  CREATININE, SERUM - Abnormal; Notable for the following:    Creatinine, Ser 1.30 (*)    GFR calc non Af Amer 37 (*)    GFR calc Af Amer 43 (*)    All other components within normal limits  GLUCOSE, CAPILLARY - Abnormal; Notable for the following:    Glucose-Capillary 68 (*)    All other components within normal limits  GLUCOSE, CAPILLARY - Abnormal; Notable for the following:    Glucose-Capillary 115 (*)    All other components within normal limits  GLUCOSE, CAPILLARY - Abnormal; Notable for the following:    Glucose-Capillary 124 (*)    All other components within normal limits  LIPID PANEL - Abnormal; Notable for the following:    Triglycerides 165 (*)    LDL Cholesterol 112 (*)    All other components within normal limits  DIFFERENTIAL  GLUCOSE, CAPILLARY  GLUCOSE, CAPILLARY  HEMOGLOBIN A1C   Dg Chest 2 View  08/26/2011  *RADIOLOGY REPORT*  Clinical Data: 78 year old female with intermittent chest pain, shortness of breath.  CHEST - 2 VIEW  Comparison: 08/26/2011 and earlier.  Findings: Upright AP and lateral views of the chest. Stable cardiomegaly and mediastinal contours.  Stable sequelae of CABG. Stable vascular congestion without overt edema.  No pneumothorax, effusion or consolidation. Stable visualized  osseous structures.  IMPRESSION: Stable cardiomegaly and vascular congestion without acute pulmonary edema.  Original Report Authenticated By: Harley Hallmark, M.D.   Dg Chest 2 View  08/26/2011  *RADIOLOGY REPORT*  Clinical Data: Stroke.  CHEST - 2 VIEW  Comparison: 10/22/2010  Findings: The cardiopericardial silhouette is enlarged. Interstitial markings are diffusely coarsened with chronic features. Bones are diffusely demineralized. Telemetry leads overlie the chest.  IMPRESSION: Stable.  Cardiomegaly with underlying chronic interstitial lung disease.  Original Report Authenticated By: ERIC A. MANSELL, M.D.   Ct Head Wo Contrast  08/26/2011  *RADIOLOGY REPORT*  Clinical Data: Left arm tingling.  Right buttock numbness.  CT HEAD WITHOUT CONTRAST  Technique:  Contiguous axial images were obtained from the base of the skull through the vertex without contrast.  Comparison: 10/22/2010  Findings: The brain shows generalized atrophy.  There are mild chronic small vessel changes within the deep white matter.  No sign of acute infarction, mass lesion, hemorrhage, hydrocephalus or extra-axial collection.  There is atherosclerotic calcification of the major vessels at the base of the brain.  The calvarium is unremarkable.  Visualized sinuses, middle ears and mastoids are clear.  Chronic empty sella incidentally noted.  IMPRESSION: Age related atrophy and mild chronic small vessel change.  No acute finding.  Original Report Authenticated By: Thomasenia Sales, M.D.   Mr Brain Wo Contrast  08/26/2011  *RADIOLOGY REPORT*  Clinical Data:  Persistent dizziness for 6 months.  Diabetic. Hypertensive.  Seizures.  MRI HEAD WITHOUT CONTRAST MRA HEAD WITHOUT CONTRAST  Technique: Multiplanar, multiecho pulse sequences of the brain and surrounding structures were obtained according to standard protocol without intravenous contrast.  Angiographic images of the head were obtained using MRA technique without contrast.  Comparison:  08/26/2011 CT.  10/22/2010 MR.  MRI HEAD  Findings:  No acute infarct.  No intracranial hemorrhage.  Remote small infarct superior right cerebellum.  Moderate small vessel disease type changes.  Global atrophy without hydrocephalus.  No intracranial mass lesion detected on this unenhanced exam.  Mild mucosal thickening ethmoid sinus air cells.  Empty sella once again noted.  Mild spinal stenosis of the cervical spine.  Left orbital implants in place.  IMPRESSION: No acute infarct.  Please see above.  MRA HEAD  Findings: Irregular broad-based bulge medial aspect of the right internal carotid artery cavernous segment may be related to atherosclerotic type changes although small aneurysm not entirely excluded.  Mild narrowing and ectasia involving portions of the left internal carotid artery cavernous and supraclinoid segment.  Ectatic distal vertical cervical segment of the left internal carotid artery.  Mild middle cerebral artery branch vessel irregularity.  Right vertebral artery is slightly dominant size.  Mild narrowing distal left vertebral artery.  Nonvisualization right PICA.  Mild to moderate narrowing involving portions of the left PICA.  Mild irregularity with minimal narrowing proximal and distal aspect of the basilar artery without high-grade stenosis.  Mild narrowing proximal right posterior cerebral artery.  IMPRESSION: Irregular broad-based bulge medial aspect of the right internal carotid artery cavernous segment may be related to atherosclerotic type changes although small aneurysm not entirely excluded.  Mild intracranial atherosclerotic type changes otherwise noted as detailed above.  Original Report Authenticated By: Fuller Canada, M.D.   Mr Mra Head/brain Wo Cm  08/26/2011  *RADIOLOGY REPORT*  Clinical Data:  Persistent dizziness for 6 months.  Diabetic. Hypertensive.  Seizures.  MRI HEAD WITHOUT CONTRAST MRA HEAD WITHOUT CONTRAST  Technique: Multiplanar, multiecho pulse sequences of the  brain and surrounding structures were obtained according to standard protocol without intravenous contrast.  Angiographic images of the head were obtained using MRA technique without contrast.  Comparison: 08/26/2011 CT.  10/22/2010 MR.  MRI HEAD  Findings:  No acute infarct.  No intracranial hemorrhage.  Remote small infarct superior right cerebellum.  Moderate small vessel disease type changes.  Global atrophy without hydrocephalus.  No intracranial mass lesion detected on this unenhanced exam.  Mild mucosal thickening ethmoid sinus air cells.  Empty sella once again noted.  Mild spinal stenosis of the cervical spine.  Left orbital implants in place.  IMPRESSION: No acute infarct.  Please see above.  MRA HEAD  Findings: Irregular broad-based bulge medial aspect of the right internal carotid artery cavernous segment may be related to atherosclerotic type changes although small aneurysm not entirely excluded.  Mild narrowing and ectasia involving portions of the left internal carotid artery cavernous and supraclinoid segment.  Ectatic distal vertical cervical segment of the left internal carotid artery.  Mild middle cerebral artery branch vessel irregularity.  Right vertebral artery is slightly dominant size.  Mild narrowing distal left vertebral artery.  Nonvisualization right PICA.  Mild to moderate narrowing involving portions of the left PICA.  Mild irregularity with minimal narrowing proximal and distal aspect of the basilar artery without high-grade stenosis.  Mild narrowing proximal right posterior cerebral artery.  IMPRESSION: Irregular broad-based bulge medial aspect of the right internal carotid artery cavernous segment may be related to atherosclerotic type changes although small aneurysm not entirely excluded.  Mild intracranial atherosclerotic type changes otherwise noted as detailed above.  Original Report Authenticated By: Fuller Canada, M.D.     1. Numbness      Date: 08/26/2011  Rate: 61   Rhythm: normal sinus rhythm and premature ventricular contractions (PVC)  QRS Axis: normal  Intervals: normal  ST/T Wave abnormalities: normal  Conduction Disutrbances:right bundle branch block  Narrative Interpretation:   Old EKG Reviewed: none available    MDM  Patient came in with numbness. Previous history of stroke. Patient will be admitted to rule out a CVA.        Juliet Rude. Rubin Payor, MD 08/27/11 0454

## 2011-08-27 LAB — HEMOGLOBIN A1C: Mean Plasma Glucose: 103 mg/dL (ref <117)

## 2011-08-27 LAB — LIPID PANEL
Cholesterol: 191 mg/dL (ref 0–200)
HDL: 46 mg/dL (ref >39)
Total CHOL/HDL Ratio: 4.2 RATIO
VLDL: 33 mg/dL (ref 0–40)

## 2011-08-27 LAB — GLUCOSE, CAPILLARY: Glucose-Capillary: 92 mg/dL (ref 70–99)

## 2011-08-27 MED ORDER — MECLIZINE HCL 25 MG PO TABS
25.0000 mg | ORAL_TABLET | Freq: Two times a day (BID) | ORAL | Status: DC
  Administered 2011-08-27 – 2011-08-30 (×6): 25 mg via ORAL
  Filled 2011-08-27 (×7): qty 1

## 2011-08-27 MED ORDER — HYDRALAZINE HCL 50 MG PO TABS
50.0000 mg | ORAL_TABLET | Freq: Three times a day (TID) | ORAL | Status: DC
  Administered 2011-08-27 – 2011-08-29 (×5): 50 mg via ORAL
  Filled 2011-08-27 (×11): qty 1

## 2011-08-27 MED ORDER — LOSARTAN POTASSIUM 50 MG PO TABS
100.0000 mg | ORAL_TABLET | Freq: Every day | ORAL | Status: DC
  Administered 2011-08-28 – 2011-08-30 (×3): 100 mg via ORAL
  Filled 2011-08-27 (×3): qty 2

## 2011-08-27 MED ORDER — CARVEDILOL 6.25 MG PO TABS
6.2500 mg | ORAL_TABLET | Freq: Two times a day (BID) | ORAL | Status: DC
  Filled 2011-08-27 (×2): qty 1

## 2011-08-27 MED ORDER — PINDOLOL 5 MG PO TABS
5.0000 mg | ORAL_TABLET | Freq: Two times a day (BID) | ORAL | Status: DC
  Administered 2011-08-27 – 2011-08-30 (×6): 5 mg via ORAL
  Filled 2011-08-27 (×7): qty 1

## 2011-08-27 NOTE — Evaluation (Signed)
Physical Therapy Evaluation Patient Details Name: [name] MRN: [id number] DOB: Mar 18, 1928 Today's Date: 08/27/2011 Time: 5621-3086 PT Time Calculation (min): 39 min  PT Assessment / Plan / Recommendation Clinical Impression  pt adm with R side numbness and tingling, though at evaluation no abnormal s/s noted by pt.  From a strength and gait stability standpoint, I suspect that she is approaching baseline functioning, though she does say that the her lightheadedness is still present.  At this time there are no further PT needs.  D/C from acute PT, no follow up at this time.    PT Assessment  Patent does not need any further PT services    Follow Up Recommendations  No PT follow up    Equipment Recommendations  None recommended by PT    Frequency      Precautions / Restrictions Precautions Precautions: Fall (minor) Restrictions Weight Bearing Restrictions: No   Pertinent Vitals/Pain      Mobility  Transfers Transfers: Sit to Stand;Stand to Sit Sit to Stand: 5: Supervision;From chair/3-in-1;With upper extremity assist Stand to Sit: 5: Supervision;With upper extremity assist;To chair/3-in-1 Details for Transfer Assistance: no cuing needed, used good mechanics and armrests for safety Ambulation/Gait Ambulation/Gait Assistance: 5: Supervision Ambulation Distance (Feet): 150 Feet Assistive device: Straight cane Ambulation/Gait Assistance Details: cane used appropriately to allow pt to amb. with stability. Gait Pattern: Within Functional Limits Stairs: No    Exercises     PT Goals    Visit Information  Last PT Received On: 08/27/11 Assistance Needed: +1    Subjective Data  Subjective: I really don't have anyone to come in and help me out, but I try to do things for myself Patient Stated Goal: Home and independent   Prior Functioning  Home Living Lives With: Alone Available Help at Discharge: Other (Comment) (daughter intermittently) Type of Home:  Apartment Home Access: Level entry Home Layout: One level;Able to live on main level with bedroom/bathroom Bathroom Shower/Tub: Engineer, manufacturing systems: Standard Home Adaptive Equipment: Bedside commode/3-in-1;Electric Scooter;Straight cane Prior Function Level of Independence: Independent with assistive device(s) Able to Take Stairs?: Yes Driving: No (use SCAT) Communication Communication: HOH;Other (comment) (L ear)    Cognition  Overall Cognitive Status: Appears within functional limits for tasks assessed/performed Arousal/Alertness: Awake/alert Orientation Level: Appears intact for tasks assessed Behavior During Session: [facility] for tasks performed    Extremity/Trunk Assessment Right Upper Extremity Assessment RUE ROM/Strength/Tone: Within functional levels RUE Sensation: WFL - Light Touch RUE Coordination: WFL - gross motor Left Upper Extremity Assessment LUE ROM/Strength/Tone: Within functional levels LUE Sensation: WFL - Light Touch LUE Coordination: WFL - gross motor Right Lower Extremity Assessment RLE ROM/Strength/Tone: Within functional levels RLE Sensation: WFL - Light Touch RLE Coordination: WFL - gross motor Left Lower Extremity Assessment LLE ROM/Strength/Tone: Within functional levels LLE Sensation: WFL - Light Touch LLE Coordination: WFL - gross motor   Balance Balance Balance Assessed: Yes Static Standing Balance Static Standing - Balance Support: No upper extremity supported;During functional activity Static Standing - Level of Assistance: 5: Stand by assistance  End of Session PT - End of Session Activity Tolerance: Patient tolerated treatment well Patient left: in chair;with call bell/phone within reach Nurse Communication: Mobility status   Joci Dress, Eliseo Gum 08/27/2011, 12:23 PM  08/27/2011  [place] Bing, PT [phone] (pager)

## 2011-08-27 NOTE — Progress Notes (Signed)
Pt's BP dropped to 30's and pt had a 3.06 sec pause, HR came back up to 50-60's, pt's dizziness has improved but pt states that she is still slightly dizzy.  MD paged, awaiting return phone call.

## 2011-08-27 NOTE — Progress Notes (Signed)
Subjective: Patient had an episode overnight when she became bradycardic and her BP dropped.  Had exacerbation of her dizziness at that time.  Also with an episode today of left hand numbness.  Reports episodes that have now been present for about a year.  Describes them as being unilateral (left sided or right sided) at times and at other times being bilateral.  MRI of the brain performed and shows no acute event but shows evidence of moderate severity small vessel disease.  Objective: Current vital signs: BP 141/53  Pulse 64  Temp(Src) 97.5 F (36.4 C) (Oral)  Resp 16  Ht 5\' 2"  (1.575 m)  Wt 102.1 kg (225 lb 1.4 oz)  BMI 41.17 kg/m2  SpO2 95% Vital signs in last 24 hours: Temp:  [97.5 F (36.4 C)-98.6 F (37 C)] 97.5 F (36.4 C) (04/25 1325) Pulse Rate:  [51-80] 64  (04/25 1325) Resp:  [16-18] 16  (04/25 1325) BP: (115-197)/(46-84) 141/53 mmHg (04/25 1325) SpO2:  [93 %-98 %] 95 % (04/25 1325) Weight:  [102.1 kg (225 lb 1.4 oz)] 102.1 kg (225 lb 1.4 oz) (04/24 1732)  Intake/Output from previous day: 04/24 0701 - 04/25 0700 In: 560 [P.O.:360; I.V.:200] Out: -  Intake/Output this shift: Total I/O In: 360 [P.O.:360] Out: -  Nutritional status: Carb Control  Neurologic Exam: MS: AAO*3, no aphasia, followed complex commands  CN: EOMI, PERRL, VFF, no facial asymmetry, tongue midline, sensation of V1 through V3 is intact b/l  Motor: no drift, 5/5 strength in all extremities  Sensory: no deficit to LT  Coord: F to N intact b/l  Reflexes: 1+ in UE, trace in KJ, absent in AJ, plantars mute bilaterally  Gait: deferred    Lab Results: Results for orders placed during the hospital encounter of 08/26/11 (from the past 48 hour(s))  CBC     Status: Abnormal   Collection Time   08/26/11  8:52 AM      Component Value Range Comment   WBC 4.3  4.0 - 10.5 (K/uL)    RBC 3.51 (*) 3.87 - 5.11 (MIL/uL)    Hemoglobin 11.1 (*) 12.0 - 15.0 (g/dL)    HCT 40.9 (*) 81.1 - 46.0 (%)    MCV 96.6   78.0 - 100.0 (fL)    MCH 31.6  26.0 - 34.0 (pg)    MCHC 32.7  30.0 - 36.0 (g/dL)    RDW 91.4  78.2 - 95.6 (%)    Platelets 137 (*) 150 - 400 (K/uL)   DIFFERENTIAL     Status: Normal   Collection Time   08/26/11  8:52 AM      Component Value Range Comment   Neutrophils Relative 64  43 - 77 (%)    Neutro Abs 2.8  1.7 - 7.7 (K/uL)    Lymphocytes Relative 26  12 - 46 (%)    Lymphs Abs 1.1  0.7 - 4.0 (K/uL)    Monocytes Relative 7  3 - 12 (%)    Monocytes Absolute 0.3  0.1 - 1.0 (K/uL)    Eosinophils Relative 3  0 - 5 (%)    Eosinophils Absolute 0.1  0.0 - 0.7 (K/uL)    Basophils Relative 0  0 - 1 (%)    Basophils Absolute 0.0  0.0 - 0.1 (K/uL)   BASIC METABOLIC PANEL     Status: Abnormal   Collection Time   08/26/11  8:52 AM      Component Value Range Comment   Sodium 141  135 - 145 (mEq/L)    Potassium 4.0  3.5 - 5.1 (mEq/L)    Chloride 100  96 - 112 (mEq/L)    CO2 30  19 - 32 (mEq/L)    Glucose, Bld 96  70 - 99 (mg/dL)    BUN 38 (*) 6 - 23 (mg/dL)    Creatinine, Ser 1.61 (*) 0.50 - 1.10 (mg/dL)    Calcium 9.6  8.4 - 10.5 (mg/dL)    GFR calc non Af Amer 37 (*) >90 (mL/min)    GFR calc Af Amer 43 (*) >90 (mL/min)   URINALYSIS, ROUTINE W REFLEX MICROSCOPIC     Status: Abnormal   Collection Time   08/26/11  9:28 AM      Component Value Range Comment   Color, Urine YELLOW  YELLOW     APPearance CLEAR  CLEAR     Specific Gravity, Urine 1.013  1.005 - 1.030     pH 7.0  5.0 - 8.0     Glucose, UA NEGATIVE  NEGATIVE (mg/dL)    Hgb urine dipstick NEGATIVE  NEGATIVE     Bilirubin Urine NEGATIVE  NEGATIVE     Ketones, ur NEGATIVE  NEGATIVE (mg/dL)    Protein, ur NEGATIVE  NEGATIVE (mg/dL)    Urobilinogen, UA 1.0  0.0 - 1.0 (mg/dL)    Nitrite NEGATIVE  NEGATIVE     Leukocytes, UA TRACE (*) NEGATIVE    URINE MICROSCOPIC-ADD ON     Status: Abnormal   Collection Time   08/26/11  9:28 AM      Component Value Range Comment   Squamous Epithelial / LPF FEW (*) RARE     WBC, UA 0-2  <3  (WBC/hpf)    Bacteria, UA RARE  RARE    CBC     Status: Abnormal   Collection Time   08/26/11  2:18 PM      Component Value Range Comment   WBC 4.5  4.0 - 10.5 (K/uL)    RBC 3.53 (*) 3.87 - 5.11 (MIL/uL)    Hemoglobin 11.3 (*) 12.0 - 15.0 (g/dL)    HCT 09.6 (*) 04.5 - 46.0 (%)    MCV 96.0  78.0 - 100.0 (fL)    MCH 32.0  26.0 - 34.0 (pg)    MCHC 33.3  30.0 - 36.0 (g/dL)    RDW 40.9  81.1 - 91.4 (%)    Platelets 145 (*) 150 - 400 (K/uL)   CREATININE, SERUM     Status: Abnormal   Collection Time   08/26/11  2:18 PM      Component Value Range Comment   Creatinine, Ser 1.30 (*) 0.50 - 1.10 (mg/dL)    GFR calc non Af Amer 37 (*) >90 (mL/min)    GFR calc Af Amer 43 (*) >90 (mL/min)   GLUCOSE, CAPILLARY     Status: Abnormal   Collection Time   08/26/11  5:28 PM      Component Value Range Comment   Glucose-Capillary 68 (*) 70 - 99 (mg/dL)   GLUCOSE, CAPILLARY     Status: Normal   Collection Time   08/26/11  6:35 PM      Component Value Range Comment   Glucose-Capillary 82  70 - 99 (mg/dL)   GLUCOSE, CAPILLARY     Status: Abnormal   Collection Time   08/26/11 10:20 PM      Component Value Range Comment   Glucose-Capillary 115 (*) 70 - 99 (mg/dL)    Comment 1 Notify  RN     GLUCOSE, CAPILLARY     Status: Abnormal   Collection Time   08/27/11  6:57 AM      Component Value Range Comment   Glucose-Capillary 124 (*) 70 - 99 (mg/dL)   GLUCOSE, CAPILLARY     Status: Normal   Collection Time   08/27/11 11:30 AM      Component Value Range Comment   Glucose-Capillary 92  70 - 99 (mg/dL)   LIPID PANEL     Status: Abnormal   Collection Time   08/27/11  1:22 PM      Component Value Range Comment   Cholesterol 191  0 - 200 (mg/dL)    Triglycerides 161 (*) <150 (mg/dL)    HDL 46  >09 (mg/dL)    Total CHOL/HDL Ratio 4.2      VLDL 33  0 - 40 (mg/dL)    LDL Cholesterol 604 (*) 0 - 99 (mg/dL)     Lipid Panel  Basename 08/27/11 1322  CHOL 191  TRIG 165*  HDL 46  CHOLHDL 4.2  VLDL 33    LDLCALC 112*    Studies/Results: Dg Chest 2 View  08/26/2011  *RADIOLOGY REPORT*  Clinical Data: 78 year old female with intermittent chest pain, shortness of breath.  CHEST - 2 VIEW  Comparison: 08/26/2011 and earlier.  Findings: Upright AP and lateral views of the chest. Stable cardiomegaly and mediastinal contours.  Stable sequelae of CABG. Stable vascular congestion without overt edema.  No pneumothorax, effusion or consolidation. Stable visualized osseous structures.  IMPRESSION: Stable cardiomegaly and vascular congestion without acute pulmonary edema.  Original Report Authenticated By: Harley Hallmark, M.D.   Dg Chest 2 View  08/26/2011  *RADIOLOGY REPORT*  Clinical Data: Stroke.  CHEST - 2 VIEW  Comparison: 10/22/2010  Findings: The cardiopericardial silhouette is enlarged. Interstitial markings are diffusely coarsened with chronic features. Bones are diffusely demineralized. Telemetry leads overlie the chest.  IMPRESSION: Stable.  Cardiomegaly with underlying chronic interstitial lung disease.  Original Report Authenticated By: ERIC A. MANSELL, M.D.   Ct Head Wo Contrast  08/26/2011  *RADIOLOGY REPORT*  Clinical Data: Left arm tingling.  Right buttock numbness.  CT HEAD WITHOUT CONTRAST  Technique:  Contiguous axial images were obtained from the base of the skull through the vertex without contrast.  Comparison: 10/22/2010  Findings: The brain shows generalized atrophy.  There are mild chronic small vessel changes within the deep white matter.  No sign of acute infarction, mass lesion, hemorrhage, hydrocephalus or extra-axial collection.  There is atherosclerotic calcification of the major vessels at the base of the brain.  The calvarium is unremarkable.  Visualized sinuses, middle ears and mastoids are clear.  Chronic empty sella incidentally noted.  IMPRESSION: Age related atrophy and mild chronic small vessel change.  No acute finding.  Original Report Authenticated By: Thomasenia Sales, M.D.    Mr Brain Wo Contrast  08/26/2011  *RADIOLOGY REPORT*  Clinical Data:  Persistent dizziness for 6 months.  Diabetic. Hypertensive.  Seizures.  MRI HEAD WITHOUT CONTRAST MRA HEAD WITHOUT CONTRAST  Technique: Multiplanar, multiecho pulse sequences of the brain and surrounding structures were obtained according to standard protocol without intravenous contrast.  Angiographic images of the head were obtained using MRA technique without contrast.  Comparison: 08/26/2011 CT.  10/22/2010 MR.  MRI HEAD  Findings:  No acute infarct.  No intracranial hemorrhage.  Remote small infarct superior right cerebellum.  Moderate small vessel disease type changes.  Global atrophy without hydrocephalus.  No  intracranial mass lesion detected on this unenhanced exam.  Mild mucosal thickening ethmoid sinus air cells.  Empty sella once again noted.  Mild spinal stenosis of the cervical spine.  Left orbital implants in place.  IMPRESSION: No acute infarct.  Please see above.  MRA HEAD  Findings: Irregular broad-based bulge medial aspect of the right internal carotid artery cavernous segment may be related to atherosclerotic type changes although small aneurysm not entirely excluded.  Mild narrowing and ectasia involving portions of the left internal carotid artery cavernous and supraclinoid segment.  Ectatic distal vertical cervical segment of the left internal carotid artery.  Mild middle cerebral artery branch vessel irregularity.  Right vertebral artery is slightly dominant size.  Mild narrowing distal left vertebral artery.  Nonvisualization right PICA.  Mild to moderate narrowing involving portions of the left PICA.  Mild irregularity with minimal narrowing proximal and distal aspect of the basilar artery without high-grade stenosis.  Mild narrowing proximal right posterior cerebral artery.  IMPRESSION: Irregular broad-based bulge medial aspect of the right internal carotid artery cavernous segment may be related to atherosclerotic  type changes although small aneurysm not entirely excluded.  Mild intracranial atherosclerotic type changes otherwise noted as detailed above.  Original Report Authenticated By: Fuller Canada, M.D.   Mr Mra Head/brain Wo Cm  08/26/2011  *RADIOLOGY REPORT*  Clinical Data:  Persistent dizziness for 6 months.  Diabetic. Hypertensive.  Seizures.  MRI HEAD WITHOUT CONTRAST MRA HEAD WITHOUT CONTRAST  Technique: Multiplanar, multiecho pulse sequences of the brain and surrounding structures were obtained according to standard protocol without intravenous contrast.  Angiographic images of the head were obtained using MRA technique without contrast.  Comparison: 08/26/2011 CT.  10/22/2010 MR.  MRI HEAD  Findings:  No acute infarct.  No intracranial hemorrhage.  Remote small infarct superior right cerebellum.  Moderate small vessel disease type changes.  Global atrophy without hydrocephalus.  No intracranial mass lesion detected on this unenhanced exam.  Mild mucosal thickening ethmoid sinus air cells.  Empty sella once again noted.  Mild spinal stenosis of the cervical spine.  Left orbital implants in place.  IMPRESSION: No acute infarct.  Please see above.  MRA HEAD  Findings: Irregular broad-based bulge medial aspect of the right internal carotid artery cavernous segment may be related to atherosclerotic type changes although small aneurysm not entirely excluded.  Mild narrowing and ectasia involving portions of the left internal carotid artery cavernous and supraclinoid segment.  Ectatic distal vertical cervical segment of the left internal carotid artery.  Mild middle cerebral artery branch vessel irregularity.  Right vertebral artery is slightly dominant size.  Mild narrowing distal left vertebral artery.  Nonvisualization right PICA.  Mild to moderate narrowing involving portions of the left PICA.  Mild irregularity with minimal narrowing proximal and distal aspect of the basilar artery without high-grade stenosis.   Mild narrowing proximal right posterior cerebral artery.  IMPRESSION: Irregular broad-based bulge medial aspect of the right internal carotid artery cavernous segment may be related to atherosclerotic type changes although small aneurysm not entirely excluded.  Mild intracranial atherosclerotic type changes otherwise noted as detailed above.  Original Report Authenticated By: Fuller Canada, M.D.    Medications:  I have reviewed the patient's current medications. Scheduled:   . aspirin  300 mg Rectal Daily   Or  . aspirin  325 mg Oral Daily  . carvedilol  6.25 mg Oral BID WC  . enoxaparin  40 mg Subcutaneous Q24H  . Fluticasone-Salmeterol  1 puff  Inhalation Q12H  . hydrALAZINE  50 mg Oral TID  . insulin aspart  0-9 Units Subcutaneous TID WC  . insulin detemir  26 Units Subcutaneous q morning - 10a  . losartan  100 mg Oral Daily  . torsemide  20 mg Oral BID  . DISCONTD: carvedilol  12.5 mg Oral BID WC  . DISCONTD: hydrALAZINE  25 mg Oral TID  . DISCONTD: losartan  50 mg Oral Daily    Assessment/Plan:  Patient Active Hospital Problem List: Numbness and tingling of right arm and leg (08/26/2011)   Assessment: On further conversation with the patient it seems that she is having numbness in any one extremity or combination of all extremities at intermittent periods.  Dizziness is fairly persistent but worsens at times.  After the episode last evening it may actually be these episodes that lead to the exacerbation of her dizziness.  Baseline dizziness may be a consequence of her diffuse small vessel disease.  She actually does not describe this persistent dizziness as a vertigo but more of a fuzzy/lightheaded sensation. Symptoms do not seem to be related to cerebral ischemia.  MRI shows no acute disease.      Plan:  1. Will follow up echo and carotid results.    2.  Agree with continued ASA and for cardiology to evaluate   LOS: 1 day   Thana Farr, MD [facility] [phone] 08/27/2011  2:28 PM

## 2011-08-27 NOTE — Progress Notes (Signed)
Called 2D echo department to check on the status of when pt will have echo/carotid dopplers, techs are unavailable at this time, Diplomatic Services operational officer to have tech call upon return to department.

## 2011-08-27 NOTE — Progress Notes (Signed)
Occupational Therapy Evaluation Patient Details Name: NNENNA MEADOR MRN: [id number] DOB: [birth year] Today's Date: 08/27/2011 Time: 4098-1191 OT Time Calculation (min): 17 min  OT Assessment / Plan / Recommendation Clinical Impression  Pt admitted with R UE numbness and tingling.  Pt appears to be near baseline level but reports that she feels weaker than usual.  Will benefit from acute OT to address below problem list. Recommending HHOT for home safety eval.    OT Assessment  Patient needs continued OT Services    Follow Up Recommendations  Home health OT;Supervision - Intermittent    Equipment Recommendations  None recommended by OT    Frequency Min 2X/week    Precautions / Restrictions Precautions Precautions: Fall   Pertinent Vitals/Pain NA    ADL  Upper Body Bathing: Simulated;Set up Where Assessed - Upper Body Bathing: Sitting, bed Lower Body Bathing: Simulated;Supervision/safety Where Assessed - Lower Body Bathing: Sit to stand from bed Upper Body Dressing: Simulated;Set up Where Assessed - Upper Body Dressing: Sit to stand from bed Lower Body Dressing: Performed;Supervision/safety Where Assessed - Lower Body Dressing: Sit to stand from bed Toilet Transfer: Simulated;Supervision/safety Toilet Transfer Method: Stand pivot Toilet Transfer Equipment: Other (comment) (bed) Equipment Used: Gait belt ADL Comments: Appears near baseline.    OT Goals Acute Rehab OT Goals OT Goal Formulation: With patient Time For Goal Achievement: 09/03/11 Potential to Achieve Goals: Good ADL Goals Pt Will Perform Grooming: Standing at sink;with modified independence ADL Goal: Grooming - Progress: Goal set today Pt Will Transfer to Toilet: with modified independence;Ambulation;with DME;3-in-1 ADL Goal: Toilet Transfer - Progress: Goal set today Pt Will Perform Tub/Shower Transfer: Tub transfer;with supervision;Ambulation;with DME ADL Goal: Tub/Shower Transfer - Progress: Goal set  today  Visit Information  Last OT Received On: 08/27/11 Assistance Needed: +1    Subjective Data  Subjective: I feel weaker than usual.   Prior Functioning  Home Living Lives With: Alone Available Help at Discharge: Family;Available PRN/intermittently Type of Home: Apartment Home Access: Level entry Home Layout: One level;Able to live on main level with bedroom/bathroom Bathroom Shower/Tub: Engineer, manufacturing systems: Standard Home Adaptive Equipment: Bedside commode/3-in-1;Electric Scooter;Straight cane Additional Comments: Pt reports that she takes baths and does not like showers. Prior Function Level of Independence: Independent with assistive device(s) Able to Take Stairs?: Yes Driving: No (use SCAT) Vocation: Retired Musician: HOH;Other (comment) (left ear) Dominant Hand: Right    Cognition  Overall Cognitive Status: Appears within functional limits for tasks assessed/performed Arousal/Alertness: Awake/alert Orientation Level: Appears intact for tasks assessed Behavior During Session: [facility] for tasks performed    Extremity/Trunk Assessment Right Upper Extremity Assessment RUE ROM/Strength/Tone: Within functional levels RUE Sensation: WFL - Light Touch RUE Coordination: WFL - gross motor Left Upper Extremity Assessment LUE ROM/Strength/Tone: Within functional levels LUE Sensation: WFL - Light Touch LUE Coordination: WFL - gross motor   Mobility Bed Mobility Bed Mobility: Sit to Supine;Supine to Sit;Sitting - Scoot to Edge of Bed Supine to Sit: 6: Modified independent (Device/Increase time) Sitting - Scoot to Edge of Bed: 6: Modified independent (Device/Increase time) Sit to Supine: 6: Modified independent (Device/Increase time) Transfers Transfers: Sit to Stand;Stand to Sit Sit to Stand: 5: Supervision;From bed;With upper extremity assist Stand to Sit: 5: Supervision;To bed;With upper extremity assist Details for Transfer Assistance: pt  demonstrated safe technique   Exercise    Balance Balance Balance Assessed: Yes Static Standing Balance Static Standing - Balance Support: No upper extremity supported;During functional activity Static Standing - Level of Assistance: 5:  Stand by assistance  End of Session OT - End of Session Equipment Utilized During Treatment: Gait belt Activity Tolerance: Patient limited by fatigue Patient left: in bed;with call bell/phone within reach Nurse Communication: Mobility status  08/27/2011 Cipriano Mile OTR/L Pager [phone] Office [phone]  Cipriano Mile 08/27/2011, 5:19 PM

## 2011-08-27 NOTE — Progress Notes (Addendum)
Pt c/o dizziness after sitting up on side of bed to eat breakfast this am BP 170/84 manually and HR 80, prior to am BP meds, Pt states that she is dizzy when laying down also.  Pt has no other complaints, MD paged x3, awaiting return phone call.

## 2011-08-27 NOTE — Progress Notes (Signed)
CSW met with pt at bedside to discuss pt dc plans. Pt stated that she can return home but is interested in assisted living or senior living community. Pt feels she needs more a community and support system as she is getting older and does not have anyone to help out. CSW agreed to look into some different options, but that assisted living and senior communities are paid out of pocket and it may be a lengthy process. Pt stated she understand and would be greatful to get process started before she returns home.   .Clinical social worker continuing to follow pt to assist with pt dc plans and further csw needs.   [name], [name]  [phone] .08/27/2011 1752pm

## 2011-08-27 NOTE — Progress Notes (Signed)
*  PRELIMINARY RESULTS* Echocardiogram 2D Echocardiogram has been performed.  Glean Salen [facility] 08/27/2011, 2:19 PM

## 2011-08-27 NOTE — Progress Notes (Signed)
Spoke with Dr. Robb Matar, he is to come and assess pt, no new orders at this time.

## 2011-08-27 NOTE — Progress Notes (Signed)
Subjective: Complaining of lightheadedness.  Objective: Filed Vitals:   08/27/11 0400 08/27/11 0500 08/27/11 0829 08/27/11 1019  BP: 150/78 154/69 170/84 197/68  Pulse: 64 73 80 62  Temp: 98.6 F (37 C) 97.7 F (36.5 C)  97.9 F (36.6 C)  TempSrc: Oral Oral  Oral  Resp: 18 18  18   Height:      Weight:      SpO2: 94% 96%  98%   Weight change:   Intake/Output Summary (Last 24 hours) at 08/27/11 1106 Last data filed at 08/27/11 0900  Gross per 24 hour  Intake    680 ml  Output      0 ml  Net    680 ml    General: Alert, awake, oriented x3, in no acute distress.  HEENT: No bruits, no goiter.  Heart: Regular rate and rhythm, without murmurs, rubs, gallops.  Lungs: Crackles left side, bilateral air movement.  Abdomen: Soft, nontender, nondistended, positive bowel sounds.  Neuro: Grossly intact, nonfocal.   Lab Results:  Basename 08/26/11 1418 08/26/11 0852  NA -- 141  K -- 4.0  CL -- 100  CO2 -- 30  GLUCOSE -- 96  BUN -- 38*  CREATININE 1.30* 1.31*  CALCIUM -- 9.6  MG -- --  PHOS -- --   No results found for this basename: AST:2,ALT:2,ALKPHOS:2,BILITOT:2,PROT:2,ALBUMIN:2 in the last 72 hours No results found for this basename: LIPASE:2,AMYLASE:2 in the last 72 hours  Basename 08/26/11 1418 08/26/11 0852  WBC 4.5 4.3  NEUTROABS -- 2.8  HGB 11.3* 11.1*  HCT 33.9* 33.9*  MCV 96.0 96.6  PLT 145* 137*   No results found for this basename: CKTOTAL:3,CKMB:3,CKMBINDEX:3,TROPONINI:3 in the last 72 hours No components found with this basename: POCBNP:3 No results found for this basename: DDIMER:2 in the last 72 hours No results found for this basename: HGBA1C:2 in the last 72 hours No results found for this basename: CHOL:2,HDL:2,LDLCALC:2,TRIG:2,CHOLHDL:2,LDLDIRECT:2 in the last 72 hours No results found for this basename: TSH,T4TOTAL,FREET3,T3FREE,THYROIDAB in the last 72 hours No results found for this basename:  VITAMINB12:2,FOLATE:2,FERRITIN:2,TIBC:2,IRON:2,RETICCTPCT:2 in the last 72 hours  Micro Results: No results found for this or any previous visit (from the past 240 hour(s)).  Studies/Results: Dg Chest 2 View  08/26/2011  *RADIOLOGY REPORT*  Clinical Data: 78 year old female with intermittent chest pain, shortness of breath.  CHEST - 2 VIEW  Comparison: 08/26/2011 and earlier.  Findings: Upright AP and lateral views of the chest. Stable cardiomegaly and mediastinal contours.  Stable sequelae of CABG. Stable vascular congestion without overt edema.  No pneumothorax, effusion or consolidation. Stable visualized osseous structures.  IMPRESSION: Stable cardiomegaly and vascular congestion without acute pulmonary edema.  Original Report Authenticated By: Harley Hallmark, M.D.   Dg Chest 2 View  08/26/2011  *RADIOLOGY REPORT*  Clinical Data: Stroke.  CHEST - 2 VIEW  Comparison: 10/22/2010  Findings: The cardiopericardial silhouette is enlarged. Interstitial markings are diffusely coarsened with chronic features. Bones are diffusely demineralized. Telemetry leads overlie the chest.  IMPRESSION: Stable.  Cardiomegaly with underlying chronic interstitial lung disease.  Original Report Authenticated By: ERIC A. MANSELL, M.D.   Ct Head Wo Contrast  08/26/2011  *RADIOLOGY REPORT*  Clinical Data: Left arm tingling.  Right buttock numbness.  CT HEAD WITHOUT CONTRAST  Technique:  Contiguous axial images were obtained from the base of the skull through the vertex without contrast.  Comparison: 10/22/2010  Findings: The brain shows generalized atrophy.  There are mild chronic small vessel changes within the deep white  matter.  No sign of acute infarction, mass lesion, hemorrhage, hydrocephalus or extra-axial collection.  There is atherosclerotic calcification of the major vessels at the base of the brain.  The calvarium is unremarkable.  Visualized sinuses, middle ears and mastoids are clear.  Chronic empty sella  incidentally noted.  IMPRESSION: Age related atrophy and mild chronic small vessel change.  No acute finding.  Original Report Authenticated By: Thomasenia Sales, M.D.   Mr Brain Wo Contrast  08/26/2011  *RADIOLOGY REPORT*  Clinical Data:  Persistent dizziness for 6 months.  Diabetic. Hypertensive.  Seizures.  MRI HEAD WITHOUT CONTRAST MRA HEAD WITHOUT CONTRAST  Technique: Multiplanar, multiecho pulse sequences of the brain and surrounding structures were obtained according to standard protocol without intravenous contrast.  Angiographic images of the head were obtained using MRA technique without contrast.  Comparison: 08/26/2011 CT.  10/22/2010 MR.  MRI HEAD  Findings:  No acute infarct.  No intracranial hemorrhage.  Remote small infarct superior right cerebellum.  Moderate small vessel disease type changes.  Global atrophy without hydrocephalus.  No intracranial mass lesion detected on this unenhanced exam.  Mild mucosal thickening ethmoid sinus air cells.  Empty sella once again noted.  Mild spinal stenosis of the cervical spine.  Left orbital implants in place.  IMPRESSION: No acute infarct.  Please see above.  MRA HEAD  Findings: Irregular broad-based bulge medial aspect of the right internal carotid artery cavernous segment may be related to atherosclerotic type changes although small aneurysm not entirely excluded.  Mild narrowing and ectasia involving portions of the left internal carotid artery cavernous and supraclinoid segment.  Ectatic distal vertical cervical segment of the left internal carotid artery.  Mild middle cerebral artery branch vessel irregularity.  Right vertebral artery is slightly dominant size.  Mild narrowing distal left vertebral artery.  Nonvisualization right PICA.  Mild to moderate narrowing involving portions of the left PICA.  Mild irregularity with minimal narrowing proximal and distal aspect of the basilar artery without high-grade stenosis.  Mild narrowing proximal right  posterior cerebral artery.  IMPRESSION: Irregular broad-based bulge medial aspect of the right internal carotid artery cavernous segment may be related to atherosclerotic type changes although small aneurysm not entirely excluded.  Mild intracranial atherosclerotic type changes otherwise noted as detailed above.  Original Report Authenticated By: Fuller Canada, M.D.   Mr Mra Head/brain Wo Cm  08/26/2011  *RADIOLOGY REPORT*  Clinical Data:  Persistent dizziness for 6 months.  Diabetic. Hypertensive.  Seizures.  MRI HEAD WITHOUT CONTRAST MRA HEAD WITHOUT CONTRAST  Technique: Multiplanar, multiecho pulse sequences of the brain and surrounding structures were obtained according to standard protocol without intravenous contrast.  Angiographic images of the head were obtained using MRA technique without contrast.  Comparison: 08/26/2011 CT.  10/22/2010 MR.  MRI HEAD  Findings:  No acute infarct.  No intracranial hemorrhage.  Remote small infarct superior right cerebellum.  Moderate small vessel disease type changes.  Global atrophy without hydrocephalus.  No intracranial mass lesion detected on this unenhanced exam.  Mild mucosal thickening ethmoid sinus air cells.  Empty sella once again noted.  Mild spinal stenosis of the cervical spine.  Left orbital implants in place.  IMPRESSION: No acute infarct.  Please see above.  MRA HEAD  Findings: Irregular broad-based bulge medial aspect of the right internal carotid artery cavernous segment may be related to atherosclerotic type changes although small aneurysm not entirely excluded.  Mild narrowing and ectasia involving portions of the left internal carotid artery cavernous  and supraclinoid segment.  Ectatic distal vertical cervical segment of the left internal carotid artery.  Mild middle cerebral artery branch vessel irregularity.  Right vertebral artery is slightly dominant size.  Mild narrowing distal left vertebral artery.  Nonvisualization right PICA.  Mild to  moderate narrowing involving portions of the left PICA.  Mild irregularity with minimal narrowing proximal and distal aspect of the basilar artery without high-grade stenosis.  Mild narrowing proximal right posterior cerebral artery.  IMPRESSION: Irregular broad-based bulge medial aspect of the right internal carotid artery cavernous segment may be related to atherosclerotic type changes although small aneurysm not entirely excluded.  Mild intracranial atherosclerotic type changes otherwise noted as detailed above.  Original Report Authenticated By: Fuller Canada, M.D.    Medications: I have reviewed the patient's current medications.  Assessment and plan: Principal Problem:  *Numbness and tingling of right arm and leg HgbA1c, fasting lipid panel  Pending. - MRI no acute infarcts., MRA as above. - PT consult, OT consult, Speech consult : pending - Echocardiogram, - Carotid dopplers pending. - Prophylactic therapy-Antiplatelet med: Aspirin - dose 325 mg PO daily  - Cardiac monitoring: episode of bradycardia on telemetry. --she relates orthostatic like symptoms upon sitting. -patient is having episodes of orthostatic like symptoms along with >2.0 pauses with bradycardia. Call cardiology.   Uncontrolled DIABETES MELLITUS, TYPE II: Well controlled.   DYSLIPIDEMIA Fasting lipid panel. On statins    RENAL INSUFFICIENCY, CHRONIC At baseline.   HTN (hypertension)  high today, continue coreg, hydralazine and hydralazine. Increase cozaar. Continue to monitor.    LOS: 1 day   Marinda Elk M.D. Pager: [phone] [facility] 08/27/2011, 11:06 AM

## 2011-08-27 NOTE — Evaluation (Signed)
Speech Language Pathology Evaluation Patient Details Name: [name] MRN: [id number] DOB: 07-02-1927 Today's Date: 08/27/2011  Problem List:  Patient Active Problem List  Diagnoses  . DIABETES MELLITUS, TYPE II  . PURE HYPERCHOLESTEROLEMIA  . DYSLIPIDEMIA  . OBESITY NOS  . ANEMIA, NORMOCYTIC, CHRONIC  . LOSS, SENSORY HEARING, BILATERAL  . HYPERTENSION, BENIGN ESSENTIAL  . MYOCARDIAL INFARCTION  . CORONARY ARTERY DISEASE  . DIASTOLIC HEART FAILURE, CHRONIC  . CHRONIC COMB SYSTOLIC&DIASTOLIC HEART FAILURE  . CAROTID ARTERY STENOSIS  . ASTHMA  . DIVERTICULOSIS, COLON  . RENAL INSUFFICIENCY, CHRONIC  . LOW BACK PAIN SYNDROME  . OSTEOPOROSIS  . FATIGUE / MALAISE  . GAIT IMBALANCE  . DEPENDENT EDEMA, LEGS, BILATERAL  . DIVERTICULAR BLEEDING, HX OF  . CARDIAC CATHETERIZATION, LEFT, HX OF  . Numbness and tingling of right arm and leg  . HTN (hypertension)   Past Medical History:  Past Medical History  Diagnosis Date  . Hypertension   . Diabetes mellitus   . Asthma   . Hx of CABG   . Seizures   . Coronary artery disease   . Stroke     TIA    . Shortness of breath   . Arthritis    Past Surgical History:  Past Surgical History  Procedure Date  . Eye surgery     SLP Assessment/Plan/Recommendation Clinical Impression Statement: Demonstrates functional cognitive-communicative abilities that are likely at baseline level with some residual right facial droop without impact on speech intelligibility or swallowing.  No skilled SLP services at this time.  SLP Recommendation/Assessment: Patient does not need any further Speech Lanaguage Pathology Services No Skilled Speech Therapy: All education completed;Patient at baseline level of functioning  Myra Rude, M.S.,CCC-SLP Pager [phone] 08/27/2011, 1:20 PM

## 2011-08-27 NOTE — Consult Note (Signed)
CARDIOLOGY CONSULT NOTE  Patient ID: [name] MRN: [id number] DOB/AGE: [id number] 76 y.o.  Admit date: 08/26/2011 Referring Physician Dr. Radonna Ricker Primary Physician: Beverley Fiedler, MD, MD Reason for Consultation Please evaluate for dizziness and probable need for permanent pacemaker implantation.  HPI: Patient admitted with right-sided tingling and numbness. Patient has chronic dizziness. She has no other new neurological deficits.  She still has occasional tingling sensation in the left arm. She presently denies any chest pain although has chronic dyspnea. She gets lightheaded especially when she stands up suddenly and this is also chronic. She's not had any frank syncopal episodes. She has had difficult to control hypertension in the outpatient setting. She has previously been evaluated for possible stroke and visual defect. Patient was admitted to [facility] on October 06, 2010 with acute onset of left visual field defect.   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   She had temporal artery biopsy.     This was reportedly normal.     Past Medical History  Diagnosis Date  . Hypertension   . Diabetes mellitus   . Asthma   . Hx of CABG   . Seizures   . Coronary artery disease   . Stroke     TIA    . Shortness of breath   . Arthritis      Past Surgical History  Procedure Date  . Eye surgery      History reviewed. No pertinent family history.  Social History: History   Social History  . Marital Status: Widowed    Spouse Name: N/A    Number of Children: N/A  . Years of Education: N/A   Occupational History  . Not on file.   Social History Main Topics  . Smoking status: Former Games developer  . Smokeless tobacco: Never Used  . Alcohol Use: No  . Drug Use: No  . Sexually Active: No   Other Topics Concern  . Not on file   Social History Narrative  . No narrative on file     Prescriptions prior to  admission  Medication Sig Dispense Refill  . aspirin EC 81 MG tablet Take 81 mg by mouth daily.      . carvedilol (COREG) 25 MG tablet Take 12.5 mg by mouth 2 (two) times daily with a meal.      . Fluticasone-Salmeterol (ADVAIR) 250-50 MCG/DOSE AEPB Inhale 1 puff into the lungs every 12 (twelve) hours.      . hydrALAZINE (APRESOLINE) 25 MG tablet Take 25 mg by mouth 3 (three) times daily.      . insulin detemir (LEVEMIR) 100 UNIT/ML injection Inject 26 Units into the skin every morning.      [name] losartan (COZAAR) 50 MG tablet Take 50 mg by mouth daily.      [name] torsemide (DEMADEX) 20 MG tablet Take 20 mg by mouth 2 (two) times daily. Morning and at 3pm        ROS: General: no fevers/chills/night sweats ENT: no sore throat or hearing loss Resp: Has chronic dyspnea CV: no edema or palpitations GI: no abdominal pain, nausea, vomiting, diarrhea, or constipation GU: no dysuria, frequency, or hematuria Skin: no rash Musculoskeletal: no joint pain or swelling Heme: no bleeding, DVT, or easy bruising Endo: no polydipsia or polyuria    Physical Exam: Blood pressure 153/57, pulse 61, temperature 97.9 F (36.6 C), temperature source Oral, resp. rate 18, height 5\' 2"  (1.575 m), weight  102.1 kg (225 lb 1.4 oz), SpO2 95.00%.   Moderateluy built and obese bodily habitus, in no acute distress.   Appears younger than stated age.   There is no cyanosis.     HEENT: normal limits.   NECK: no JVD noted.     CARDIAC EXAM: S1 S2 normal, no gallop or murmur.     CHEST EXAM: No tenderness of chest wall.   LUNGS: Clear to percuss and auscultate.   No rales or ronchi.     ABDOMEN: Soft non tender, obese with pannus.   No organomegaly.   BS positive in all 4 quadrants.     EXTREMITY: Warm, non tender Pitting edema BLE below knee,  ankle 2 - 3 plus.   MUSCULOSKELETAL EXAM: Intact with full range of motion in all 4 extremities.     NEUROLOGIC EXAM: Grossly intact without any focal deficits.   Alert O x 3.      ARTERIAL EXAM:.   Normal radial and brachial pulses without subclavian bruit.   Soft right  carotid bruit.   Femoral pulse is not felt due to body habitus.   Bruit absent.   Popliteal pulse could not be felt.   Bilateral lower extremity reveals absent hair growth, shiny skin.   No ulceration or bluish discoloration of the toes.   Pedal pulse is absent on the left and 1-2 plus on the right. Labs:   Lab Results  Component Value Date   WBC 4.5 08/26/2011   HGB 11.3* 08/26/2011   HCT 33.9* 08/26/2011   MCV 96.0 08/26/2011   PLT 145* 08/26/2011    Lab 08/26/11 1418 08/26/11 0852  NA -- 141  K -- 4.0  CL -- 100  CO2 -- 30  BUN -- 38*  CREATININE 1.30* --  CALCIUM -- 9.6  PROT -- --  BILITOT -- --  ALKPHOS -- --  ALT -- --  AST -- --  GLUCOSE -- 96   Lab Results  Component Value Date   CKTOTAL 158 10/07/2010   CKMB 2.9 10/07/2010   TROPONINI  Value: <0.30        Due to the release kinetics of cTnI, a negative result within the first hours of the onset of symptoms does not rule out myocardial infarction with certainty. If myocardial infarction is still suspected, repeat the test at appropriate intervals. 10/07/2010    Lab Results  Component Value Date   CHOL 191 08/27/2011   CHOL 140 10/07/2010   CHOL 150 09/12/2008   Lab Results  Component Value Date   HDL 46 08/27/2011   HDL 50 05/09/1094   HDL 43.40 09/12/2008   Lab Results  Component Value Date   LDLCALC 112* 08/27/2011   LDLCALC  Value: 79        Total Cholesterol/HDL:CHD Risk Coronary Heart Disease Risk Table                     Men   Women  1/2 Average Risk   3.4   3.3  Average Risk       5.0   4.4  2 X Average Risk   9.6   7.1  3 X Average Risk  23.4   11.0        Use the calculated Patient Ratio above and the CHD Risk Table to determine the patient's CHD Risk.        ATP III CLASSIFICATION (LDL):  <100     mg/dL   Optimal  100-129  mg/dL   Near or Above                    Optimal  130-159  mg/dL   Borderline  528-413  mg/dL   High   >244     mg/dL   Very High 0/05/270   LDLCALC 90 09/12/2008   Lab Results  Component Value Date   TRIG 165* 08/27/2011   TRIG 55 10/07/2010   TRIG 82.0 09/12/2008   Lab Results  Component Value Date   CHOLHDL 4.2 08/27/2011   CHOLHDL 2.8 10/07/2010   CHOLHDL 3 09/12/2008   No results found for this basename: LDLDIRECT      Radiology: Dg Chest 2 View  08/26/2011  *RADIOLOGY REPORT*  Clinical Data: 78 year old female with intermittent chest pain, shortness of breath.  CHEST - 2 VIEW  Comparison: 08/26/2011 and earlier.  Findings: Upright AP and lateral views of the chest. Stable cardiomegaly and mediastinal contours.  Stable sequelae of CABG. Stable vascular congestion without overt edema.  No pneumothorax, effusion or consolidation. Stable visualized osseous structures.  IMPRESSION: Stable cardiomegaly and vascular congestion without acute pulmonary edema.  Original Report Authenticated By: Harley Hallmark, M.D.   Dg Chest 2 View  08/26/2011  *RADIOLOGY REPORT*  Clinical Data: Stroke.  CHEST - 2 VIEW  Comparison: 10/22/2010  Findings: The cardiopericardial silhouette is enlarged. Interstitial markings are diffusely coarsened with chronic features. Bones are diffusely demineralized. Telemetry leads overlie the chest.  IMPRESSION: Stable.  Cardiomegaly with underlying chronic interstitial lung disease.  Original Report Authenticated By: ERIC A. MANSELL, M.D.   Ct Head Wo Contrast  08/26/2011  *RADIOLOGY REPORT*  Clinical Data: Left arm tingling.  Right buttock numbness.  CT HEAD WITHOUT CONTRAST  Technique:  Contiguous axial images were obtained from the base of the skull through the vertex without contrast.  Comparison: 10/22/2010  Findings: The brain shows generalized atrophy.  There are mild chronic small vessel changes within the deep white matter.  No sign of acute infarction, mass lesion, hemorrhage, hydrocephalus or extra-axial collection.  There is atherosclerotic calcification of the major  vessels at the base of the brain.  The calvarium is unremarkable.  Visualized sinuses, middle ears and mastoids are clear.  Chronic empty sella incidentally noted.  IMPRESSION: Age related atrophy and mild chronic small vessel change.  No acute finding.  Original Report Authenticated By: Thomasenia Sales, M.D.   Mr Brain Wo Contrast  08/26/2011  *RADIOLOGY REPORT*  Clinical Data:  Persistent dizziness for 6 months.  Diabetic. Hypertensive.  Seizures.  MRI HEAD WITHOUT CONTRAST MRA HEAD WITHOUT CONTRAST  Technique: Multiplanar, multiecho pulse sequences of the brain and surrounding structures were obtained according to standard protocol without intravenous contrast.  Angiographic images of the head were obtained using MRA technique without contrast.  Comparison: 08/26/2011 CT.  10/22/2010 MR.  MRI HEAD  Findings:  No acute infarct.  No intracranial hemorrhage.  Remote small infarct superior right cerebellum.  Moderate small vessel disease type changes.  Global atrophy without hydrocephalus.  No intracranial mass lesion detected on this unenhanced exam.  Mild mucosal thickening ethmoid sinus air cells.  Empty sella once again noted.  Mild spinal stenosis of the cervical spine.  Left orbital implants in place.  IMPRESSION: No acute infarct.  Please see above.  MRA HEAD  Findings: Irregular broad-based bulge medial aspect of the right internal carotid artery cavernous segment may be related to atherosclerotic type changes although small aneurysm not entirely  excluded.  Mild narrowing and ectasia involving portions of the left internal carotid artery cavernous and supraclinoid segment.  Ectatic distal vertical cervical segment of the left internal carotid artery.  Mild middle cerebral artery branch vessel irregularity.  Right vertebral artery is slightly dominant size.  Mild narrowing distal left vertebral artery.  Nonvisualization right PICA.  Mild to moderate narrowing involving portions of the left PICA.  Mild  irregularity with minimal narrowing proximal and distal aspect of the basilar artery without high-grade stenosis.  Mild narrowing proximal right posterior cerebral artery.  IMPRESSION: Irregular broad-based bulge medial aspect of the right internal carotid artery cavernous segment may be related to atherosclerotic type changes although small aneurysm not entirely excluded.  Mild intracranial atherosclerotic type changes otherwise noted as detailed above.  Original Report Authenticated By: Fuller Canada, M.D.   Mr Mra Head/brain Wo Cm  08/26/2011  *RADIOLOGY REPORT*  Clinical Data:  Persistent dizziness for 6 months.  Diabetic. Hypertensive.  Seizures.  MRI HEAD WITHOUT CONTRAST MRA HEAD WITHOUT CONTRAST  Technique: Multiplanar, multiecho pulse sequences of the brain and surrounding structures were obtained according to standard protocol without intravenous contrast.  Angiographic images of the head were obtained using MRA technique without contrast.  Comparison: 08/26/2011 CT.  10/22/2010 MR.  MRI HEAD  Findings:  No acute infarct.  No intracranial hemorrhage.  Remote small infarct superior right cerebellum.  Moderate small vessel disease type changes.  Global atrophy without hydrocephalus.  No intracranial mass lesion detected on this unenhanced exam.  Mild mucosal thickening ethmoid sinus air cells.  Empty sella once again noted.  Mild spinal stenosis of the cervical spine.  Left orbital implants in place.  IMPRESSION: No acute infarct.  Please see above.  MRA HEAD  Findings: Irregular broad-based bulge medial aspect of the right internal carotid artery cavernous segment may be related to atherosclerotic type changes although small aneurysm not entirely excluded.  Mild narrowing and ectasia involving portions of the left internal carotid artery cavernous and supraclinoid segment.  Ectatic distal vertical cervical segment of the left internal carotid artery.  Mild middle cerebral artery branch vessel  irregularity.  Right vertebral artery is slightly dominant size.  Mild narrowing distal left vertebral artery.  Nonvisualization right PICA.  Mild to moderate narrowing involving portions of the left PICA.  Mild irregularity with minimal narrowing proximal and distal aspect of the basilar artery without high-grade stenosis.  Mild narrowing proximal right posterior cerebral artery.  IMPRESSION: Irregular broad-based bulge medial aspect of the right internal carotid artery cavernous segment may be related to atherosclerotic type changes although small aneurysm not entirely excluded.  Mild intracranial atherosclerotic type changes otherwise noted as detailed above.  Original Report Authenticated By: Fuller Canada, M.D.    EKG: NSR, Left atrial abnormality RBBB, PAC. Tele: 3.01 Second ventricular pause at 1030 this morning.  ASSESSMENT AND PLAN:  1. Chronic dizziness. Her main complaint today is dizziness especially when she suddenly stands up from bed or from sitting position.  She has no other new neurological deficits.  She still has occasional tingling sensation in the left arm. 2.   Younger looking patient with Shortness of breath/Dyspnea on exertion. Chronic  Etiology probably due to obesity and uncontrolled hypertension and fluid overload state and chronic diastolic heart failure.  Anginal equivalent Probably not.    Echo today: Normal LVEF, LVH. Mod biatrial enlargement. ? PFO. Echo 4.12.2 Normal LVEF, trace MR and TR. TEE 10/07/10: Moderate to marked LVH, normal LVEF. No cardiac  source of cerebral emboli. Normal nuclear stress test on 01/24/2010.    3.   CAD/ASHD s/p CABG due to recurrent restenosis.   Cardiac CATH 2008: Recurrent restenosis (DES) in the Cx and RCA ostial 80%.   Moderate stenosis in LAD led to CABG 2008 LIMA to LAD, SVG to Cx and SVG to RCA.     4.   Chronic diastolic heart failure.    5.   Chronic renal insufficiency.   S.Cr 1.5 in july 2011.   Due to hypertensive kidney disease.     Renal vascular hypertension cannot be completely excluded. Renal Duplex 10/15/10: Left renal artery not well seen. CRO high grade stenosis. Normal kidney size. 7.   PAD with mild symptoms of mild claudication.   01/20/10 : arterial lower ext.   Dopplers: Mild reduction in ABI in the left 0.75.   Diffuse small vessel dz below knee.   Suspect left inflow (iliac disease).    8. Fluid overload state with 2-3 plue leg edema chronic. Recommendation: I would stop Coreg and start the patient on Bystolic to see whether she will continue to have bradycardia. She has had difficulty in controlling hypertension. She was previously tried on a routine and had no significant leg edema. If she continues to have significant bradycardia and difficulty in controlling hypertension and need for continued use of beta blockers then she may need a permanent pacemaker implantation. At this point continued observation is indicated. I could try Pindolol (generic with intrinsic sympathomimetic activity), however if her dizziness but does get worse we may have to switch her over to Bystolic.   Pamella Pert, MD 08/27/2011, 6:12 PM

## 2011-08-28 DIAGNOSIS — R42 Dizziness and giddiness: Secondary | ICD-10-CM | POA: Diagnosis present

## 2011-08-28 DIAGNOSIS — R001 Bradycardia, unspecified: Secondary | ICD-10-CM | POA: Diagnosis present

## 2011-08-28 LAB — GLUCOSE, CAPILLARY
Glucose-Capillary: 135 mg/dL — ABNORMAL HIGH (ref 70–99)
Glucose-Capillary: 79 mg/dL (ref 70–99)
Glucose-Capillary: 120 mg/dL — ABNORMAL HIGH (ref 70–99)
Glucose-Capillary: 93 mg/dL (ref 70–99)

## 2011-08-28 MED ORDER — TORSEMIDE 20 MG PO TABS
20.0000 mg | ORAL_TABLET | Freq: Once | ORAL | Status: AC
  Administered 2011-08-28: 20 mg via ORAL
  Filled 2011-08-28: qty 1

## 2011-08-28 NOTE — Progress Notes (Signed)
Patient refuses to have bed alarm on for safety  .Patient has fall risk score of 12.Patient does complain of dizziness,  is blind in left eye,running IVF and pole ,and telemetry monitoring.. Explained to patient that she is at risk for a fall.Patient stated ",that's stu pid .I can get out of bed by myself I don't want that alarm to keep going off when I move." Charge nurse and Water quality scientist notified. Will continue to monitor patient on hourly rounds. me

## 2011-08-28 NOTE — Progress Notes (Signed)
Subjective:  No new symptoms. Dizziness better. Does not want to take more than once Hydralazine as she states it causes dizziness.  Objective:  Vital Signs in the last 24 hours: Temp:  [97.4 F (36.3 C)-98.1 F (36.7 C)] 97.6 F (36.4 C) (04/26 1400) Pulse Rate:  [57-66] 58  (04/26 1400) Resp:  [14-20] 14  (04/26 1400) BP: (146-187)/(49-75) 171/75 mmHg (04/26 1400) SpO2:  [95 %-97 %] 96 % (04/26 1400)  Intake/Output from previous day: 04/25 0701 - 04/26 0700 In: 1816.7 [P.O.:600; I.V.:1216.7] Out: -   Physical Exam:  Moderateluy built and obese bodily habitus, in no acute distress. Appears younger than stated age. There is no cyanosis.  HEENT: normal limits. NECK: no JVD noted.  CARDIAC EXAM: S1 S2 normal, no gallop or murmur.  CHEST EXAM: No tenderness of chest wall. LUNGS: Clear to percuss and auscultate. No rales or ronchi.  ABDOMEN: Soft non tender, obese with pannus. No organomegaly. BS positive in all 4 quadrants.  EXTREMITY: Warm, non tender Pitting edema BLE below knee, ankle 2 - 3 plus. MUSCULOSKELETAL EXAM: Intact with full range of motion in all 4 extremities.  NEUROLOGIC EXAM: Grossly intact without any focal deficits. Alert O x 3.  ARTERIAL EXAM:. Normal radial and brachial pulses without subclavian bruit. Soft right carotid bruit. Femoral pulse is not felt due to body habitus. Bruit absent. Popliteal pulse could not be felt. Bilateral lower extremity reveals absent hair growth, shiny skin. No ulceration or bluish discoloration of the toes. Pedal pulse is absent on the left and 1-2 plus on the right.    Lab Results:  Basename 08/26/11 1418 08/26/11 0852  WBC 4.5 4.3  HGB 11.3* 11.1*  PLT 145* 137*    Basename 08/26/11 1418 08/26/11 0852  NA -- 141  K -- 4.0  CL -- 100  CO2 -- 30  GLUCOSE -- 96  BUN -- 38*  CREATININE 1.30* 1.31*   No results found for this basename: TROPONINI:2,CK,MB:2 in the last 72 hours Hepatic Function Panel No results found for  this basename: PROT,ALBUMIN,AST,ALT,ALKPHOS,BILITOT,BILIDIR,IBILI in the last 72 hours  Basename 08/27/11 1322  CHOL 191   No results found for this basename: PROTIME in the last 72 hours   Cardiac Studies: Tele: Patient continue to have marked bradycardia. No further pauses, but HR of 25 beats/min at 18:28 hours.   Assessment/Plan:  1. Difficult to control hypertension.Not much options. Unable to tolerate hydralazine, amlodipine. Now on los dose catapres and Pindolol which can cause bradycardia, but running out of options. 2. Sick sinus and probably high degree AV nodal disease. Needs a permanent pacemaker implant. I will d/w EP. 3. Unless problems, plan on pace rimplant Monday and I will f/u then. No stroke. Transfer to cardiac floor. 4. Medical issues: see my initial consult note.  Rec: I will have EP evaluation and patient will probably need a permanent pacemaker implant. Continue to monitor for now   Pamella Pert, M.D. 08/28/2011, 6:36 PM

## 2011-08-28 NOTE — Progress Notes (Signed)
   CARE MANAGEMENT NOTE 08/28/2011  Patient:  [name], [name]   Account Number:  [id number]  Date Initiated:  08/27/2011  Documentation initiated by:  Alvira Philips Assessment:   78 yr-old female adm with ?TIA; lives alone, daughter lives nearby; has a walker, cane and wheelchair, h/o home health services through [facility]..      In-house referral  Clinical Social Worker      DC Associate Professor  CM consult      [facility] Choice  HOME HEALTH   Choice offered to / List presented to:  C-1 Patient        HH arranged  HH-1 RN  HH-2 PT  HH-3 OT  HH-6 SOCIAL WORKER      HH agency  [facility]   Status of service:  Completed, signed off  Discharge Disposition:  HOME W HOME HEALTH SERVICES  Comments:  PCP:  Dr. Barton Fanny  Contact:  Freddi Che, daughter  [phone]  08/28/11 1115 Marq Rebello RN MSN CCM Pt to d/c home with RN, OT, PT safety eval, and SW if payor will approve.  Pt given list of agencies, prefers to d/c home with previous provider.  Referral made.  Pt plans to apply for long-term MCD for placement @ [facility].  08/27/11 1140 Odell Fasching RN MSN CCM Received CM referral for home health, PT/OT evals pending. Pt states she is interested in having an aide come in qday to assist with chores etc.  Discussed MCD PCS - pt states she is over limit for regular MCD, may qualify for long-term care MCD for ALF.  Pt is interested in exploring an ALF that will accept MCD pts - referred to CSW. 1640 Per PT and ST, no needs.

## 2011-08-28 NOTE — Progress Notes (Signed)
Clinical Social Work Department BRIEF PSYCHOSOCIAL ASSESSMENT 08/28/2011  Patient:  [name], [name]     Account Number:  [id number]     Admit date:  08/26/2011  Clinical Social Worker:  Doree Albee  Date/Time:  08/28/2011 11:00 AM  Referred by:  RN  Date Referred:  08/27/2011 Referred for  Other - See comment   Other Referral:   Pt wanted resources on ALF/senior community resources   Interview type:  Patient Other interview type:    PSYCHOSOCIAL DATA Living Status:  ALONE Admitted from facility:   Level of care:   Primary support name:  [name] Primary support relationship to patient:  CHILD, ADULT Degree of support available:   moderate    CURRENT CONCERNS Current Concerns  Other - See comment   Other Concerns:   long term care in alf/family care home    SOCIAL WORK ASSESSMENT / PLAN CSW met iwth pt at bedside to discuss potential need for assisted living or a senior community. Pt states she currently lives alone and patient feels safe to return at this time. However patient wanted resources about how to apply for mediciad and alf and senior communities available.    CSW provided active listening and emotional support. Pt stated that she is realizing she needs a stronger support system for the just in case. Pt shared her daughter has lots of health problems and does not want to make her feel more overwhelmed.   Assessment/plan status:  No Further Intervention Required Other assessment/ plan:   Information/referral to community resources:   Science writer, Technical brewer    PATIENT'S/FAMILY'S RESPONSE TO PLAN OF CARE: Pt appreciated csw concern and support. Pt is motivated to dc home when medically stable. pt states she is looking forward to looking into the list of facilities provided for future plans when needed. no further csw needs, signing off.       [name], [name]  [phone] .08/28/2011 1713pm

## 2011-08-28 NOTE — Progress Notes (Signed)
Subjective: partient complaning of dizziness also with hydralazine use. She refused her hydralazine and she has not had dizziness today. Restrict fluids Objective: Filed Vitals:   08/27/11 2000 08/27/11 2113 08/28/11 0258 08/28/11 0630  BP:  153/59 155/49 168/68  Pulse: 64 66 65 62  Temp:  98.1 F (36.7 C) 98.1 F (36.7 C) 97.4 F (36.3 C)  TempSrc:    Oral  Resp:  20 18 19   Height:      Weight:      SpO2:  96% 97% 96%   Weight change:   Intake/Output Summary (Last 24 hours) at 08/28/11 0806 Last data filed at 08/27/11 1800  Gross per 24 hour  Intake 1816.67 ml  Output      0 ml  Net 1816.67 ml    General: Alert, awake, oriented x3, in no acute distress.  HEENT: No bruits, no goiter. + JVD Heart: Regular rate and rhythm, without murmurs, rubs, gallops.  Lungs: Crackles left side, bilateral air movement.  Abdomen: Soft, nontender, nondistended, positive bowel sounds.  Neuro: Grossly intact, nonfocal.   Lab Results:  Basename 08/26/11 1418 08/26/11 0852  NA -- 141  K -- 4.0  CL -- 100  CO2 -- 30  GLUCOSE -- 96  BUN -- 38*  CREATININE 1.30* 1.31*  CALCIUM -- 9.6  MG -- --  PHOS -- --   No results found for this basename: AST:2,ALT:2,ALKPHOS:2,BILITOT:2,PROT:2,ALBUMIN:2 in the last 72 hours No results found for this basename: LIPASE:2,AMYLASE:2 in the last 72 hours  Basename 08/26/11 1418 08/26/11 0852  WBC 4.5 4.3  NEUTROABS -- 2.8  HGB 11.3* 11.1*  HCT 33.9* 33.9*  MCV 96.0 96.6  PLT 145* 137*   No results found for this basename: CKTOTAL:3,CKMB:3,CKMBINDEX:3,TROPONINI:3 in the last 72 hours No components found with this basename: POCBNP:3 No results found for this basename: DDIMER:2 in the last 72 hours  Basename 08/27/11 1322  HGBA1C 5.2    Basename 08/27/11 1322  CHOL 191  HDL 46  LDLCALC 112*  TRIG 165*  CHOLHDL 4.2  LDLDIRECT --   No results found for this basename: TSH,T4TOTAL,FREET3,T3FREE,THYROIDAB in the last 72 hours No results  found for this basename: VITAMINB12:2,FOLATE:2,FERRITIN:2,TIBC:2,IRON:2,RETICCTPCT:2 in the last 72 hours  Micro Results: No results found for this or any previous visit (from the past 240 hour(s)).  Studies/Results: Dg Chest 2 View  08/26/2011  *RADIOLOGY REPORT*  Clinical Data: 78 year old female with intermittent chest pain, shortness of breath.  CHEST - 2 VIEW  Comparison: 08/26/2011 and earlier.  Findings: Upright AP and lateral views of the chest. Stable cardiomegaly and mediastinal contours.  Stable sequelae of CABG. Stable vascular congestion without overt edema.  No pneumothorax, effusion or consolidation. Stable visualized osseous structures.  IMPRESSION: Stable cardiomegaly and vascular congestion without acute pulmonary edema.  Original Report Authenticated By: Harley Hallmark, M.D.   Dg Chest 2 View  08/26/2011  *RADIOLOGY REPORT*  Clinical Data: Stroke.  CHEST - 2 VIEW  Comparison: 10/22/2010  Findings: The cardiopericardial silhouette is enlarged. Interstitial markings are diffusely coarsened with chronic features. Bones are diffusely demineralized. Telemetry leads overlie the chest.  IMPRESSION: Stable.  Cardiomegaly with underlying chronic interstitial lung disease.  Original Report Authenticated By: ERIC A. MANSELL, M.D.   Ct Head Wo Contrast  08/26/2011  *RADIOLOGY REPORT*  Clinical Data: Left arm tingling.  Right buttock numbness.  CT HEAD WITHOUT CONTRAST  Technique:  Contiguous axial images were obtained from the base of the skull through the vertex without contrast.  Comparison: 10/22/2010  Findings: The brain shows generalized atrophy.  There are mild chronic small vessel changes within the deep white matter.  No sign of acute infarction, mass lesion, hemorrhage, hydrocephalus or extra-axial collection.  There is atherosclerotic calcification of the major vessels at the base of the brain.  The calvarium is unremarkable.  Visualized sinuses, middle ears and mastoids are clear.   Chronic empty sella incidentally noted.  IMPRESSION: Age related atrophy and mild chronic small vessel change.  No acute finding.  Original Report Authenticated By: Thomasenia Sales, M.D.   Mr Brain Wo Contrast  08/26/2011  *RADIOLOGY REPORT*  Clinical Data:  Persistent dizziness for 6 months.  Diabetic. Hypertensive.  Seizures.  MRI HEAD WITHOUT CONTRAST MRA HEAD WITHOUT CONTRAST  Technique: Multiplanar, multiecho pulse sequences of the brain and surrounding structures were obtained according to standard protocol without intravenous contrast.  Angiographic images of the head were obtained using MRA technique without contrast.  Comparison: 08/26/2011 CT.  10/22/2010 MR.  MRI HEAD  Findings:  No acute infarct.  No intracranial hemorrhage.  Remote small infarct superior right cerebellum.  Moderate small vessel disease type changes.  Global atrophy without hydrocephalus.  No intracranial mass lesion detected on this unenhanced exam.  Mild mucosal thickening ethmoid sinus air cells.  Empty sella once again noted.  Mild spinal stenosis of the cervical spine.  Left orbital implants in place.  IMPRESSION: No acute infarct.  Please see above.  MRA HEAD  Findings: Irregular broad-based bulge medial aspect of the right internal carotid artery cavernous segment may be related to atherosclerotic type changes although small aneurysm not entirely excluded.  Mild narrowing and ectasia involving portions of the left internal carotid artery cavernous and supraclinoid segment.  Ectatic distal vertical cervical segment of the left internal carotid artery.  Mild middle cerebral artery branch vessel irregularity.  Right vertebral artery is slightly dominant size.  Mild narrowing distal left vertebral artery.  Nonvisualization right PICA.  Mild to moderate narrowing involving portions of the left PICA.  Mild irregularity with minimal narrowing proximal and distal aspect of the basilar artery without high-grade stenosis.  Mild narrowing  proximal right posterior cerebral artery.  IMPRESSION: Irregular broad-based bulge medial aspect of the right internal carotid artery cavernous segment may be related to atherosclerotic type changes although small aneurysm not entirely excluded.  Mild intracranial atherosclerotic type changes otherwise noted as detailed above.  Original Report Authenticated By: Fuller Canada, M.D.   Mr Mra Head/brain Wo Cm  08/26/2011  *RADIOLOGY REPORT*  Clinical Data:  Persistent dizziness for 6 months.  Diabetic. Hypertensive.  Seizures.  MRI HEAD WITHOUT CONTRAST MRA HEAD WITHOUT CONTRAST  Technique: Multiplanar, multiecho pulse sequences of the brain and surrounding structures were obtained according to standard protocol without intravenous contrast.  Angiographic images of the head were obtained using MRA technique without contrast.  Comparison: 08/26/2011 CT.  10/22/2010 MR.  MRI HEAD  Findings:  No acute infarct.  No intracranial hemorrhage.  Remote small infarct superior right cerebellum.  Moderate small vessel disease type changes.  Global atrophy without hydrocephalus.  No intracranial mass lesion detected on this unenhanced exam.  Mild mucosal thickening ethmoid sinus air cells.  Empty sella once again noted.  Mild spinal stenosis of the cervical spine.  Left orbital implants in place.  IMPRESSION: No acute infarct.  Please see above.  MRA HEAD  Findings: Irregular broad-based bulge medial aspect of the right internal carotid artery cavernous segment may be related to atherosclerotic type  changes although small aneurysm not entirely excluded.  Mild narrowing and ectasia involving portions of the left internal carotid artery cavernous and supraclinoid segment.  Ectatic distal vertical cervical segment of the left internal carotid artery.  Mild middle cerebral artery branch vessel irregularity.  Right vertebral artery is slightly dominant size.  Mild narrowing distal left vertebral artery.  Nonvisualization right  PICA.  Mild to moderate narrowing involving portions of the left PICA.  Mild irregularity with minimal narrowing proximal and distal aspect of the basilar artery without high-grade stenosis.  Mild narrowing proximal right posterior cerebral artery.  IMPRESSION: Irregular broad-based bulge medial aspect of the right internal carotid artery cavernous segment may be related to atherosclerotic type changes although small aneurysm not entirely excluded.  Mild intracranial atherosclerotic type changes otherwise noted as detailed above.  Original Report Authenticated By: Fuller Canada, M.D.    Medications: I have reviewed the patient's current medications.  Assessment and plan: Principal Problem: Bradycardia/Lightheadness: -less bradycardia today. Change coreg to pindolol. -will continue to monitor on telemetry. -if she continues to have bradycardia might need EP after stopping beta blockers.   *Numbness and tingling of right arm and leg HgbA1c, fasting lipid panel  Pending. - MRI no acute infarcts., MRA as above.  PT consult, OT consult,no home PT - Echocardiogram: EF 60%,Carotid dopplers pending - Prophylactic therapy-Antiplatelet med: Aspirin - dose 325 mg PO daily  - Cardiac monitoring: bradycardia improved.   Uncontrolled DIABETES MELLITUS, TYPE II: Well controlled.   DYSLIPIDEMIA Fasting lipid panel. On statins    RENAL INSUFFICIENCY, CHRONIC At baseline.   HTN (hypertension)  -high today, continue pindolol,  cozaar. -d/c hydralazine. -clonidine patch    LOS: 2 days   Marinda Elk M.D. Pager: [phone] [facility] 08/28/2011, 8:06 AM

## 2011-08-28 NOTE — Consult Note (Addendum)
Subjective: Patient is stable.   Objective: Vital signs in last 24 hours: Temp:  [97.4 F (36.3 C)-98.1 F (36.7 C)] 97.4 F (36.3 C) (04/26 0630) Pulse Rate:  [61-66] 62  (04/26 0630) Resp:  [16-20] 19  (04/26 0630) BP: (141-197)/(49-68) 168/68 mmHg (04/26 0630) SpO2:  [95 %-98 %] 96 % (04/26 0630)  Intake/Output from previous day: 04/25 0701 - 04/26 0700 In: 1816.7 [P.O.:600; I.V.:1216.7] Out: -    Nutritional status: Carb Control  MS: AAO*3, no aphasia, followed complex commands  CN: EOMI, PERRL, VFF, no facial asymmetry, tongue midline, sensation of V1 through V3 is intact b/l  Motor: no drift, 5/5 strength in all extremities  Sensory: no deficit to LT  Coord: F to N intact b/l  Reflexes: 1+ in UE, trace in KJ, absent in AJ, plantars mute bilaterally  Gait: deferred  Lab Results:  Basename 08/26/11 1418 08/26/11 0852  WBC 4.5 4.3  HGB 11.3* 11.1*  HCT 33.9* 33.9*  PLT 145* 137*  NA -- 141  K -- 4.0  CL -- 100  CO2 -- 30  GLUCOSE -- 96  BUN -- 38*  CREATININE 1.30* 1.31*  CALCIUM -- 9.6  LABA1C -- --   Lipid Panel  Basename 08/27/11 1322  CHOL 191  TRIG 165*  HDL 46  CHOLHDL 4.2  VLDL 33  LDLCALC 086*   Studies/Results: Dg Chest 2 View  08/26/2011  *RADIOLOGY REPORT*  Clinical Data: 78 year old female with intermittent chest pain, shortness of breath.  CHEST - 2 VIEW  Comparison: 08/26/2011 and earlier.  Findings: Upright AP and lateral views of the chest. Stable cardiomegaly and mediastinal contours.  Stable sequelae of CABG. Stable vascular congestion without overt edema.  No pneumothorax, effusion or consolidation. Stable visualized osseous structures.  IMPRESSION: Stable cardiomegaly and vascular congestion without acute pulmonary edema.  Original Report Authenticated By: Harley Hallmark, M.D.   Dg Chest 2 View  08/26/2011  *RADIOLOGY REPORT*  Clinical Data: Stroke.  CHEST - 2 VIEW  Comparison: 10/22/2010  Findings: The cardiopericardial silhouette  is enlarged. Interstitial markings are diffusely coarsened with chronic features. Bones are diffusely demineralized. Telemetry leads overlie the chest.  IMPRESSION: Stable.  Cardiomegaly with underlying chronic interstitial lung disease.  Original Report Authenticated By: ERIC A. MANSELL, M.D.   Ct Head Wo Contrast  08/26/2011  *RADIOLOGY REPORT*  Clinical Data: Left arm tingling.  Right buttock numbness.  CT HEAD WITHOUT CONTRAST  Technique:  Contiguous axial images were obtained from the base of the skull through the vertex without contrast.  Comparison: 10/22/2010  Findings: The brain shows generalized atrophy.  There are mild chronic small vessel changes within the deep white matter.  No sign of acute infarction, mass lesion, hemorrhage, hydrocephalus or extra-axial collection.  There is atherosclerotic calcification of the major vessels at the base of the brain.  The calvarium is unremarkable.  Visualized sinuses, middle ears and mastoids are clear.  Chronic empty sella incidentally noted.  IMPRESSION: Age related atrophy and mild chronic small vessel change.  No acute finding.  Original Report Authenticated By: Thomasenia Sales, M.D.   Mr Brain Wo Contrast  08/26/2011  *RADIOLOGY REPORT*  Clinical Data:  Persistent dizziness for 6 months.  Diabetic. Hypertensive.  Seizures.  MRI HEAD WITHOUT CONTRAST MRA HEAD WITHOUT CONTRAST  Technique: Multiplanar, multiecho pulse sequences of the brain and surrounding structures were obtained according to standard protocol without intravenous contrast.  Angiographic images of the head were obtained using MRA technique without contrast.  Comparison: 08/26/2011 CT.  10/22/2010 MR.  MRI HEAD  Findings:  No acute infarct.  No intracranial hemorrhage.  Remote small infarct superior right cerebellum.  Moderate small vessel disease type changes.  Global atrophy without hydrocephalus.  No intracranial mass lesion detected on this unenhanced exam.  Mild mucosal thickening  ethmoid sinus air cells.  Empty sella once again noted.  Mild spinal stenosis of the cervical spine.  Left orbital implants in place.  IMPRESSION: No acute infarct.  Please see above.  MRA HEAD  Findings: Irregular broad-based bulge medial aspect of the right internal carotid artery cavernous segment may be related to atherosclerotic type changes although small aneurysm not entirely excluded.  Mild narrowing and ectasia involving portions of the left internal carotid artery cavernous and supraclinoid segment.  Ectatic distal vertical cervical segment of the left internal carotid artery.  Mild middle cerebral artery branch vessel irregularity.  Right vertebral artery is slightly dominant size.  Mild narrowing distal left vertebral artery.  Nonvisualization right PICA.  Mild to moderate narrowing involving portions of the left PICA.  Mild irregularity with minimal narrowing proximal and distal aspect of the basilar artery without high-grade stenosis.  Mild narrowing proximal right posterior cerebral artery.  IMPRESSION: Irregular broad-based bulge medial aspect of the right internal carotid artery cavernous segment may be related to atherosclerotic type changes although small aneurysm not entirely excluded.  Mild intracranial atherosclerotic type changes otherwise noted as detailed above.  Original Report Authenticated By: Fuller Canada, M.D.   Mr Mra Head/brain Wo Cm  08/26/2011  *RADIOLOGY REPORT*  Clinical Data:  Persistent dizziness for 6 months.  Diabetic. Hypertensive.  Seizures.  MRI HEAD WITHOUT CONTRAST MRA HEAD WITHOUT CONTRAST  Technique: Multiplanar, multiecho pulse sequences of the brain and surrounding structures were obtained according to standard protocol without intravenous contrast.  Angiographic images of the head were obtained using MRA technique without contrast.  Comparison: 08/26/2011 CT.  10/22/2010 MR.  MRI HEAD  Findings:  No acute infarct.  No intracranial hemorrhage.  Remote small  infarct superior right cerebellum.  Moderate small vessel disease type changes.  Global atrophy without hydrocephalus.  No intracranial mass lesion detected on this unenhanced exam.  Mild mucosal thickening ethmoid sinus air cells.  Empty sella once again noted.  Mild spinal stenosis of the cervical spine.  Left orbital implants in place.  IMPRESSION: No acute infarct.  Please see above.  MRA HEAD  Findings: Irregular broad-based bulge medial aspect of the right internal carotid artery cavernous segment may be related to atherosclerotic type changes although small aneurysm not entirely excluded.  Mild narrowing and ectasia involving portions of the left internal carotid artery cavernous and supraclinoid segment.  Ectatic distal vertical cervical segment of the left internal carotid artery.  Mild middle cerebral artery branch vessel irregularity.  Right vertebral artery is slightly dominant size.  Mild narrowing distal left vertebral artery.  Nonvisualization right PICA.  Mild to moderate narrowing involving portions of the left PICA.  Mild irregularity with minimal narrowing proximal and distal aspect of the basilar artery without high-grade stenosis.  Mild narrowing proximal right posterior cerebral artery.  IMPRESSION: Irregular broad-based bulge medial aspect of the right internal carotid artery cavernous segment may be related to atherosclerotic type changes although small aneurysm not entirely excluded.  Mild intracranial atherosclerotic type changes otherwise noted as detailed above.  Original Report Authenticated By: Fuller Canada, M.D.   Medications: I have reviewed the patient's current medications.  Assessment/Plan: 77 years old man woman with  TIA work-up that has been completed 1) Chronic dizziness may be related to orthostasis - will defer to medicine 2) Optimize vascular risk factors 3) Can try Lyrica 50 mg PO bid for parasthesias 4) Check SPEP and TFT - f/u with outpatient neurology 5)  Could use script for alpha lipoic acid for diabetic neuropathy  LOS: 2 days   Arlene Brickel

## 2011-08-28 NOTE — Progress Notes (Signed)
Pt educated on the use of bed alarms and the importance of them for patient safety. Pt refuses to be on alarm stating "I'm a grown woman with 6 kids I dont need that". Pt became very angry and threatened to leave. Pt educated to call nurse when she is ready to get out of bed. Pt has tele monitoring connected, blind in left eye and is complaining of intermittent dizziness. Charge nurse made aware. Will continue to monitor patient closely on hourly rounds. Ramond Craver, RN

## 2011-08-29 LAB — GLUCOSE, CAPILLARY
Glucose-Capillary: 123 mg/dL — ABNORMAL HIGH (ref 70–99)
Glucose-Capillary: 93 mg/dL (ref 70–99)

## 2011-08-29 NOTE — Progress Notes (Signed)
Pt watched Atrial Arrythmia video around 2145.  Salvadore Oxford, RN 08/29/11 [phone]

## 2011-08-29 NOTE — Progress Notes (Signed)
Subjective: No complains.  Objective: Filed Vitals:   08/28/11 1400 08/28/11 2137 08/28/11 2251 08/29/11 0400  BP: 171/75 163/63 153/53 142/50  Pulse: 58 75 78 68  Temp: 97.6 F (36.4 C) 98.1 F (36.7 C) 98.3 F (36.8 C) 98.3 F (36.8 C)  TempSrc: Oral Oral Oral Oral  Resp: 14 18 18 17   Height:   5\' 2"  (1.575 m)   Weight:   92.08 kg (203 lb)   SpO2: 96% 96% 97% 95%   Weight change:  No intake or output data in the 24 hours ending 08/29/11 0750  General: Alert, awake, oriented x3, in no acute distress.  HEENT: No bruits, no goiter. - JVD Heart: Regular rate and rhythm, without murmurs, rubs, gallops.  Lungs: Crackles left side, bilateral air movement.  Abdomen: Soft, nontender, nondistended, positive bowel sounds.  Neuro: Grossly intact, nonfocal.   Lab Results:  Basename 08/26/11 1418 08/26/11 0852  NA -- 141  K -- 4.0  CL -- 100  CO2 -- 30  GLUCOSE -- 96  BUN -- 38*  CREATININE 1.30* 1.31*  CALCIUM -- 9.6  MG -- --  PHOS -- --   No results found for this basename: AST:2,ALT:2,ALKPHOS:2,BILITOT:2,PROT:2,ALBUMIN:2 in the last 72 hours No results found for this basename: LIPASE:2,AMYLASE:2 in the last 72 hours  Basename 08/26/11 1418 08/26/11 0852  WBC 4.5 4.3  NEUTROABS -- 2.8  HGB 11.3* 11.1*  HCT 33.9* 33.9*  MCV 96.0 96.6  PLT 145* 137*   No results found for this basename: CKTOTAL:3,CKMB:3,CKMBINDEX:3,TROPONINI:3 in the last 72 hours No components found with this basename: POCBNP:3 No results found for this basename: DDIMER:2 in the last 72 hours  Basename 08/27/11 1322  HGBA1C 5.2    Basename 08/27/11 1322  CHOL 191  HDL 46  LDLCALC 112*  TRIG 165*  CHOLHDL 4.2  LDLDIRECT --   No results found for this basename: TSH,T4TOTAL,FREET3,T3FREE,THYROIDAB in the last 72 hours No results found for this basename: VITAMINB12:2,FOLATE:2,FERRITIN:2,TIBC:2,IRON:2,RETICCTPCT:2 in the last 72 hours  Micro Results: No results found for this or any  previous visit (from the past 240 hour(s)).  Studies/Results: Dg Chest 2 View  08/26/2011  *RADIOLOGY REPORT*  Clinical Data: 78 year old female with intermittent chest pain, shortness of breath.  CHEST - 2 VIEW  Comparison: 08/26/2011 and earlier.  Findings: Upright AP and lateral views of the chest. Stable cardiomegaly and mediastinal contours.  Stable sequelae of CABG. Stable vascular congestion without overt edema.  No pneumothorax, effusion or consolidation. Stable visualized osseous structures.  IMPRESSION: Stable cardiomegaly and vascular congestion without acute pulmonary edema.  Original Report Authenticated By: Harley Hallmark, M.D.   Dg Chest 2 View  08/26/2011  *RADIOLOGY REPORT*  Clinical Data: Stroke.  CHEST - 2 VIEW  Comparison: 10/22/2010  Findings: The cardiopericardial silhouette is enlarged. Interstitial markings are diffusely coarsened with chronic features. Bones are diffusely demineralized. Telemetry leads overlie the chest.  IMPRESSION: Stable.  Cardiomegaly with underlying chronic interstitial lung disease.  Original Report Authenticated By: ERIC A. MANSELL, M.D.   Ct Head Wo Contrast  08/26/2011  *RADIOLOGY REPORT*  Clinical Data: Left arm tingling.  Right buttock numbness.  CT HEAD WITHOUT CONTRAST  Technique:  Contiguous axial images were obtained from the base of the skull through the vertex without contrast.  Comparison: 10/22/2010  Findings: The brain shows generalized atrophy.  There are mild chronic small vessel changes within the deep white matter.  No sign of acute infarction, mass lesion, hemorrhage, hydrocephalus or extra-axial collection.  There is atherosclerotic calcification of the major vessels at the base of the brain.  The calvarium is unremarkable.  Visualized sinuses, middle ears and mastoids are clear.  Chronic empty sella incidentally noted.  IMPRESSION: Age related atrophy and mild chronic small vessel change.  No acute finding.  Original Report Authenticated  By: Thomasenia Sales, M.D.   Mr Brain Wo Contrast  08/26/2011  *RADIOLOGY REPORT*  Clinical Data:  Persistent dizziness for 6 months.  Diabetic. Hypertensive.  Seizures.  MRI HEAD WITHOUT CONTRAST MRA HEAD WITHOUT CONTRAST  Technique: Multiplanar, multiecho pulse sequences of the brain and surrounding structures were obtained according to standard protocol without intravenous contrast.  Angiographic images of the head were obtained using MRA technique without contrast.  Comparison: 08/26/2011 CT.  10/22/2010 MR.  MRI HEAD  Findings:  No acute infarct.  No intracranial hemorrhage.  Remote small infarct superior right cerebellum.  Moderate small vessel disease type changes.  Global atrophy without hydrocephalus.  No intracranial mass lesion detected on this unenhanced exam.  Mild mucosal thickening ethmoid sinus air cells.  Empty sella once again noted.  Mild spinal stenosis of the cervical spine.  Left orbital implants in place.  IMPRESSION: No acute infarct.  Please see above.  MRA HEAD  Findings: Irregular broad-based bulge medial aspect of the right internal carotid artery cavernous segment may be related to atherosclerotic type changes although small aneurysm not entirely excluded.  Mild narrowing and ectasia involving portions of the left internal carotid artery cavernous and supraclinoid segment.  Ectatic distal vertical cervical segment of the left internal carotid artery.  Mild middle cerebral artery branch vessel irregularity.  Right vertebral artery is slightly dominant size.  Mild narrowing distal left vertebral artery.  Nonvisualization right PICA.  Mild to moderate narrowing involving portions of the left PICA.  Mild irregularity with minimal narrowing proximal and distal aspect of the basilar artery without high-grade stenosis.  Mild narrowing proximal right posterior cerebral artery.  IMPRESSION: Irregular broad-based bulge medial aspect of the right internal carotid artery cavernous segment may be  related to atherosclerotic type changes although small aneurysm not entirely excluded.  Mild intracranial atherosclerotic type changes otherwise noted as detailed above.  Original Report Authenticated By: Fuller Canada, M.D.   Mr Mra Head/brain Wo Cm  08/26/2011  *RADIOLOGY REPORT*  Clinical Data:  Persistent dizziness for 6 months.  Diabetic. Hypertensive.  Seizures.  MRI HEAD WITHOUT CONTRAST MRA HEAD WITHOUT CONTRAST  Technique: Multiplanar, multiecho pulse sequences of the brain and surrounding structures were obtained according to standard protocol without intravenous contrast.  Angiographic images of the head were obtained using MRA technique without contrast.  Comparison: 08/26/2011 CT.  10/22/2010 MR.  MRI HEAD  Findings:  No acute infarct.  No intracranial hemorrhage.  Remote small infarct superior right cerebellum.  Moderate small vessel disease type changes.  Global atrophy without hydrocephalus.  No intracranial mass lesion detected on this unenhanced exam.  Mild mucosal thickening ethmoid sinus air cells.  Empty sella once again noted.  Mild spinal stenosis of the cervical spine.  Left orbital implants in place.  IMPRESSION: No acute infarct.  Please see above.  MRA HEAD  Findings: Irregular broad-based bulge medial aspect of the right internal carotid artery cavernous segment may be related to atherosclerotic type changes although small aneurysm not entirely excluded.  Mild narrowing and ectasia involving portions of the left internal carotid artery cavernous and supraclinoid segment.  Ectatic distal vertical cervical segment of the left internal carotid artery.  Mild middle cerebral artery branch vessel irregularity.  Right vertebral artery is slightly dominant size.  Mild narrowing distal left vertebral artery.  Nonvisualization right PICA.  Mild to moderate narrowing involving portions of the left PICA.  Mild irregularity with minimal narrowing proximal and distal aspect of the basilar artery  without high-grade stenosis.  Mild narrowing proximal right posterior cerebral artery.  IMPRESSION: Irregular broad-based bulge medial aspect of the right internal carotid artery cavernous segment may be related to atherosclerotic type changes although small aneurysm not entirely excluded.  Mild intracranial atherosclerotic type changes otherwise noted as detailed above.  Original Report Authenticated By: Fuller Canada, M.D.    Medications: I have reviewed the patient's current medications.  Assessment and plan: Principal Problem: Bradycardia/Lightheadness: -no  bradycardia today. On pindolol. -will continue to monitor on telemetry. -lightheadedness has been chronic. -bradycardia incidental finding, will moniotr for 24Hrs on telemetry - Cardiac monitoring: episode of 25 yesterday.   *Numbness and tingling of right arm and leg - HgbA1c: 5.2 Lipid panel LDL 112, on statins - MRI no acute infarcts., MRA as above.  PT consult, OT consult,no home PT - Echocardiogram: EF 60%,Carotid dopplers pending - Prophylactic therapy-Antiplatelet med: Aspirin - dose 325 mg PO daily  -resolved.   Uncontrolled DIABETES MELLITUS, TYPE II: Well controlled.   DYSLIPIDEMIA Fasting lipid panel. On statins    RENAL INSUFFICIENCY, CHRONIC At baseline.   HTN (hypertension)  -high today, continue pindolol,  cozaar. - hydralazine. -clonidine patch    LOS: 3 days   Marinda Elk M.D. Pager: [phone] [facility] 08/29/2011, 7:50 AM

## 2011-08-30 LAB — GLUCOSE, CAPILLARY: Glucose-Capillary: 73 mg/dL (ref 70–99)

## 2011-08-30 MED ORDER — MECLIZINE HCL 25 MG PO TABS: 25.0000 mg | ORAL_TABLET | Freq: Two times a day (BID) | ORAL | Status: AC

## 2011-08-30 MED ORDER — POTASSIUM CHLORIDE CRYS ER 20 MEQ PO TBCR
40.0000 meq | EXTENDED_RELEASE_TABLET | Freq: Two times a day (BID) | ORAL | Status: DC
  Administered 2011-08-30: 40 meq via ORAL
  Filled 2011-08-30: qty 2

## 2011-08-30 MED ORDER — FUROSEMIDE 10 MG/ML IJ SOLN
80.0000 mg | Freq: Once | INTRAMUSCULAR | Status: AC
  Administered 2011-08-30: 80 mg via INTRAVENOUS
  Filled 2011-08-30: qty 8

## 2011-08-30 MED ORDER — PINDOLOL 5 MG PO TABS: 5.0000 mg | ORAL_TABLET | Freq: Two times a day (BID) | ORAL | Status: DC

## 2011-08-30 NOTE — Discharge Summary (Signed)
Admit date: 08/26/2011 Discharge date: 08/30/2011  Primary Care Physician:  Beverley Fiedler, MD, MD   Discharge Diagnoses:   Active Hospital Problems  Diagnoses Date Noted   . HTN (hypertension) 08/26/2011   . CORONARY ARTERY DISEASE 04/29/2007   . RENAL INSUFFICIENCY, CHRONIC 12/21/2006   . DYSLIPIDEMIA 12/21/2006   . DIABETES MELLITUS, TYPE II 12/21/2006     Resolved Hospital Problems  Diagnoses Date Noted Date Resolved  . Numbness and tingling of right arm and leg 08/26/2011 08/30/2011  . Bradycardia 08/28/2011 08/30/2011  . Lightheadedness 08/28/2011 08/30/2011     DISCHARGE MEDICATION: Medication List  As of 08/30/2011  9:57 AM   TAKE these medications         aspirin EC 81 MG tablet   Take 81 mg by mouth daily.      carvedilol 25 MG tablet   Commonly known as: COREG   Take 12.5 mg by mouth 2 (two) times daily with a meal.      Fluticasone-Salmeterol 250-50 MCG/DOSE Aepb   Commonly known as: ADVAIR   Inhale 1 puff into the lungs every 12 (twelve) hours.      hydrALAZINE 25 MG tablet   Commonly known as: APRESOLINE   Take 25 mg by mouth 3 (three) times daily.      insulin detemir 100 UNIT/ML injection   Commonly known as: LEVEMIR   Inject 26 Units into the skin every morning.      losartan 50 MG tablet   Commonly known as: COZAAR   Take 50 mg by mouth daily.      pindolol 5 MG tablet   Commonly known as: VISKEN   Take 1 tablet (5 mg total) by mouth 2 (two) times daily.      torsemide 20 MG tablet   Commonly known as: DEMADEX   Take 20 mg by mouth 2 (two) times daily. Morning and at 3pm              Consults: Treatment Team:  Kym Groom, MD   SIGNIFICANT DIAGNOSTIC STUDIES:  Dg Chest 2 View  08/26/2011  *RADIOLOGY REPORT*  Clinical Data: 78 year old female with intermittent chest pain, shortness of breath.  CHEST - 2 VIEW  Comparison: 08/26/2011 and earlier.  Findings: Upright AP and lateral views of the chest. Stable cardiomegaly and  mediastinal contours.  Stable sequelae of CABG. Stable vascular congestion without overt edema.  No pneumothorax, effusion or consolidation. Stable visualized osseous structures.  IMPRESSION: Stable cardiomegaly and vascular congestion without acute pulmonary edema.  Original Report Authenticated By: Harley Hallmark, M.D.   Dg Chest 2 View  08/26/2011  *RADIOLOGY REPORT*  Clinical Data: Stroke.  CHEST - 2 VIEW  Comparison: 10/22/2010  Findings: The cardiopericardial silhouette is enlarged. Interstitial markings are diffusely coarsened with chronic features. Bones are diffusely demineralized. Telemetry leads overlie the chest.  IMPRESSION: Stable.  Cardiomegaly with underlying chronic interstitial lung disease.  Original Report Authenticated By: ERIC A. MANSELL, M.D.   Ct Head Wo Contrast  08/26/2011  *RADIOLOGY REPORT*  Clinical Data: Left arm tingling.  Right buttock numbness.  CT HEAD WITHOUT CONTRAST  Technique:  Contiguous axial images were obtained from the base of the skull through the vertex without contrast.  Comparison: 10/22/2010  Findings: The brain shows generalized atrophy.  There are mild chronic small vessel changes within the deep white matter.  No sign of acute infarction, mass lesion, hemorrhage, hydrocephalus or extra-axial collection.  There is atherosclerotic calcification of the major vessels at the base  of the brain.  The calvarium is unremarkable.  Visualized sinuses, middle ears and mastoids are clear.  Chronic empty sella incidentally noted.  IMPRESSION: Age related atrophy and mild chronic small vessel change.  No acute finding.  Original Report Authenticated By: Thomasenia Sales, M.D.   Mr Brain Wo Contrast  08/26/2011  *RADIOLOGY REPORT*  Clinical Data:  Persistent dizziness for 6 months.  Diabetic. Hypertensive.  Seizures.  MRI HEAD WITHOUT CONTRAST MRA HEAD WITHOUT CONTRAST  Technique: Multiplanar, multiecho pulse sequences of the brain and surrounding structures were obtained  according to standard protocol without intravenous contrast.  Angiographic images of the head were obtained using MRA technique without contrast.  Comparison: 08/26/2011 CT.  10/22/2010 MR.  MRI HEAD  Findings:  No acute infarct.  No intracranial hemorrhage.  Remote small infarct superior right cerebellum.  Moderate small vessel disease type changes.  Global atrophy without hydrocephalus.  No intracranial mass lesion detected on this unenhanced exam.  Mild mucosal thickening ethmoid sinus air cells.  Empty sella once again noted.  Mild spinal stenosis of the cervical spine.  Left orbital implants in place.  IMPRESSION: No acute infarct.  Please see above.  MRA HEAD  Findings: Irregular broad-based bulge medial aspect of the right internal carotid artery cavernous segment may be related to atherosclerotic type changes although small aneurysm not entirely excluded.  Mild narrowing and ectasia involving portions of the left internal carotid artery cavernous and supraclinoid segment.  Ectatic distal vertical cervical segment of the left internal carotid artery.  Mild middle cerebral artery branch vessel irregularity.  Right vertebral artery is slightly dominant size.  Mild narrowing distal left vertebral artery.  Nonvisualization right PICA.  Mild to moderate narrowing involving portions of the left PICA.  Mild irregularity with minimal narrowing proximal and distal aspect of the basilar artery without high-grade stenosis.  Mild narrowing proximal right posterior cerebral artery.  IMPRESSION: Irregular broad-based bulge medial aspect of the right internal carotid artery cavernous segment may be related to atherosclerotic type changes although small aneurysm not entirely excluded.  Mild intracranial atherosclerotic type changes otherwise noted as detailed above.  Original Report Authenticated By: Fuller Canada, M.D.   Mr Mra Head/brain Wo Cm  08/26/2011  *RADIOLOGY REPORT*  Clinical Data:  Persistent dizziness for 6  months.  Diabetic. Hypertensive.  Seizures.  MRI HEAD WITHOUT CONTRAST MRA HEAD WITHOUT CONTRAST  Technique: Multiplanar, multiecho pulse sequences of the brain and surrounding structures were obtained according to standard protocol without intravenous contrast.  Angiographic images of the head were obtained using MRA technique without contrast.  Comparison: 08/26/2011 CT.  10/22/2010 MR.  MRI HEAD  Findings:  No acute infarct.  No intracranial hemorrhage.  Remote small infarct superior right cerebellum.  Moderate small vessel disease type changes.  Global atrophy without hydrocephalus.  No intracranial mass lesion detected on this unenhanced exam.  Mild mucosal thickening ethmoid sinus air cells.  Empty sella once again noted.  Mild spinal stenosis of the cervical spine.  Left orbital implants in place.  IMPRESSION: No acute infarct.  Please see above.  MRA HEAD  Findings: Irregular broad-based bulge medial aspect of the right internal carotid artery cavernous segment may be related to atherosclerotic type changes although small aneurysm not entirely excluded.  Mild narrowing and ectasia involving portions of the left internal carotid artery cavernous and supraclinoid segment.  Ectatic distal vertical cervical segment of the left internal carotid artery.  Mild middle cerebral artery branch vessel irregularity.  Right vertebral  artery is slightly dominant size.  Mild narrowing distal left vertebral artery.  Nonvisualization right PICA.  Mild to moderate narrowing involving portions of the left PICA.  Mild irregularity with minimal narrowing proximal and distal aspect of the basilar artery without high-grade stenosis.  Mild narrowing proximal right posterior cerebral artery.  IMPRESSION: Irregular broad-based bulge medial aspect of the right internal carotid artery cavernous segment may be related to atherosclerotic type changes although small aneurysm not entirely excluded.  Mild intracranial atherosclerotic type  changes otherwise noted as detailed above.  Original Report Authenticated By: Fuller Canada, M.D.     ECHO: Left ventricle: The cavity size was normal. Wall thickness was increased in a pattern of mild LVH. Systolic function was normal. The estimated ejection fraction was in the range of 55% to 60%. Left ventricular diastolic function parameters were normal.    No results found for this or any previous visit (from the past 240 hour(s)).  BRIEF ADMITTING H & P: 78 year old black female with a past medical history of CVA, hypertension, diabetes, history of coronary artery disease status post CABG, history of blindness in her left eye who presents to the ED for the above-noted complaints. The patient she has been having intermittent right-sided tingling and numbness for the past few days however when she woke up this morning it was especially worse. She claimed it was also involving her right face area as well. She also claims to have intermittent speech difficulty-however during this interview her speech was mostly fluent at times it was interrupted. She claims to have numbness and tingling in her right arm and right leg, however during this interview she claimed that this was slightly better. She claims that she normally walks with a walker. She denies any obvious slurred speech or visual problems. She denies any chest pain. She claims to have chronic intermittent exertional dyspnea at times. She denies any nausea vomiting. She denies any abdominal pain. The hospitalist service was asked to admit this patient for further evaluation and treatment given her complicated past medical history. She was also found to have a significantly elevated blood pressureon admission.   Active Hospital Problems  Diagnoses Date Noted   . HTN (hypertension): Her blood pressure is running high during her hospital stay. Her beta blocker Coreg had to be DC as this is causing her to have severe bradycardia she was  started on pindolol she tolerated this well. She will continue Demadex pindolol and Catapres. Once her Catapres was started. Her blood pressure was much improved. She will need to followup with a cardiologist as an outpatient. And titrate blood pressure medications as tolerated.  08/26/2011   . CORONARY ARTERY DISEASE: Asymptomatic continue aspirin.  04/29/2007   . RENAL INSUFFICIENCY, CHRONIC: At baseline no changes were made.  12/21/2006   . DYSLIPIDEMIA: Her lipid panel looks within normal limits her LDL is 112 ratio is 46. We'll consider increasing it as an outpatient her statin.  12/21/2006   . DIABETES MELLITUS, TYPE II: No changes were made.  12/21/2006     Resolved Hospital Problems  Diagnoses Date Noted Date Resolved  . Numbness and tingling of right arm and leg: This resolved unclear etiology question if it was the hydralazine versus the metoprolol. She was ruled out for stroke brain MRI was done which was negative CT scan showed no acute abnormalities. Her 2-D echo with results as above.  08/26/2011 08/30/2011  . Bradycardia: This probably secondary to her Coreg. She was changed to pindolol  and her dizziness did improve but did not resolve. Clear if her dizziness is related to her bradycardia. She has had no bradycardia after the change in her beta blocker. And she continues to have intermittent episodes of lightheadedness. She will be sent out on meclizine. And will continue followup with her cardiologist as an outpatient for further evaluation.  08/28/2011 08/30/2011  . Lightheadedness: The patient was rule out for stroke was monitored on telemetry with evidence of bradycardia. As her beta blocker was changed her bradycardia resolved but her light headedness only improved slightly. She was checked for orthostatic changes which were negative. Cardiology was consulted they recommended a Holter monitor as an outpatient. Which Dr. Newt Lukes will followup on.she at one point thought it was the  hydralazine. She has been adamant to take her hydralazine although this helps her blood pressure control.  08/28/2011 08/30/2011     Disposition and Follow-up:   Discharge Orders    Future Orders Please Complete By Expires   Diet - low sodium heart healthy      Increase activity slowly        Follow-up Information    Follow up with Beverley Fiedler, MD in 2 weeks.   Contact information:   [address] [place] [address] [phone]       Follow up with Pamella Pert, MD in 1 week. (holter monitor)    Contact information:   [address]  [place] [address] [phone]           DISCHARGE EXAM:  General: Alert, awake, oriented x3, in no acute distress.  HEENT: No bruits, no goiter. - JVD  Heart: Regular rate and rhythm, without murmurs, rubs, gallops.  Lungs: Crackles left side, bilateral air movement.  Abdomen: Soft, nontender, nondistended, positive bowel sounds.  Neuro: Grossly intact, nonfocal.   Blood pressure 153/65, pulse 66, temperature 98.7 F (37.1 C), temperature source Oral, resp. rate 18, height 5\' 2"  (1.575 m), weight 92.08 kg (203 lb), SpO2 96.00%.  No results found for this basename: NA:2,K:2,CL:2,CO2:2,GLUCOSE:2,BUN:2,CREATININE:2,CALCIUM:2,MG:2,PHOS:2 in the last 72 hours No results found for this basename: AST:2,ALT:2,ALKPHOS:2,BILITOT:2,PROT:2,ALBUMIN:2 in the last 72 hours No results found for this basename: LIPASE:2,AMYLASE:2 in the last 72 hours No results found for this basename: WBC:2,NEUTROABS:2,HGB:2,HCT:2,MCV:2,PLT:2 in the last 72 hours  Signed: Marinda Elk M.D. 08/30/2011, 9:57 AM

## 2011-08-30 NOTE — Progress Notes (Signed)
PT Cancellation Note  Treatment cancelled today due to pt was evaluated on 4/25 and was found to not have acute PT needs and PT signed off.  Reviewed progress notes and no new mobility concerns noted.  Will again sign off.    Sunny Schlein, Rebecca [phone] 08/30/2011, 7:06 AM

## 2011-10-07 ENCOUNTER — Encounter (HOSPITAL_COMMUNITY): Payer: Self-pay

## 2011-10-07 ENCOUNTER — Emergency Department (HOSPITAL_COMMUNITY)
Admission: EM | Admit: 2011-10-07 | Discharge: 2011-10-07 | Disposition: A | Discharge status: Home / Self Care | Payer: Medicare Other | Attending: Emergency Medicine | Admitting: Emergency Medicine

## 2011-10-07 ENCOUNTER — Emergency Department (HOSPITAL_COMMUNITY): Payer: Medicare Other

## 2011-10-07 DIAGNOSIS — E119 Type 2 diabetes mellitus without complications: Secondary | ICD-10-CM | POA: Insufficient documentation

## 2011-10-07 DIAGNOSIS — I252 Old myocardial infarction: Secondary | ICD-10-CM | POA: Insufficient documentation

## 2011-10-07 DIAGNOSIS — I10 Essential (primary) hypertension: Secondary | ICD-10-CM | POA: Insufficient documentation

## 2011-10-07 DIAGNOSIS — I2510 Atherosclerotic heart disease of native coronary artery without angina pectoris: Secondary | ICD-10-CM | POA: Insufficient documentation

## 2011-10-07 DIAGNOSIS — R0602 Shortness of breath: Secondary | ICD-10-CM | POA: Insufficient documentation

## 2011-10-07 DIAGNOSIS — N390 Urinary tract infection, site not specified: Secondary | ICD-10-CM

## 2011-10-07 DIAGNOSIS — E785 Hyperlipidemia, unspecified: Secondary | ICD-10-CM | POA: Insufficient documentation

## 2011-10-07 DIAGNOSIS — Z8673 Personal history of transient ischemic attack (TIA), and cerebral infarction without residual deficits: Secondary | ICD-10-CM | POA: Insufficient documentation

## 2011-10-07 DIAGNOSIS — M7989 Other specified soft tissue disorders: Secondary | ICD-10-CM | POA: Insufficient documentation

## 2011-10-07 DIAGNOSIS — I4510 Unspecified right bundle-branch block: Secondary | ICD-10-CM | POA: Insufficient documentation

## 2011-10-07 DIAGNOSIS — R531 Weakness: Secondary | ICD-10-CM

## 2011-10-07 DIAGNOSIS — I498 Other specified cardiac arrhythmias: Secondary | ICD-10-CM | POA: Insufficient documentation

## 2011-10-07 DIAGNOSIS — R5381 Other malaise: Secondary | ICD-10-CM | POA: Insufficient documentation

## 2011-10-07 HISTORY — DX: Acute myocardial infarction, unspecified: I21.9

## 2011-10-07 HISTORY — DX: Hyperlipidemia, unspecified: E78.5

## 2011-10-07 HISTORY — DX: Bradycardia, unspecified: R00.1

## 2011-10-07 LAB — COMPREHENSIVE METABOLIC PANEL
ALT: 40 U/L — ABNORMAL HIGH (ref 0–35)
AST: 64 U/L — ABNORMAL HIGH (ref 0–37)
Albumin: 3.7 g/dL (ref 3.5–5.2)
CO2: 32 mEq/L (ref 19–32)
Calcium: 9.3 mg/dL (ref 8.4–10.5)
Chloride: 99 mEq/L (ref 96–112)
Creatinine, Ser: 1.99 mg/dL — ABNORMAL HIGH (ref 0.50–1.10)
Sodium: 138 mEq/L (ref 135–145)

## 2011-10-07 LAB — CBC
HCT: 35.7 % — ABNORMAL LOW (ref 36.0–46.0)
MCHC: 32.8 g/dL (ref 30.0–36.0)
Platelets: 136 10*3/uL — ABNORMAL LOW (ref 150–400)
RDW: 12.5 % (ref 11.5–15.5)
WBC: 4.9 10*3/uL (ref 4.0–10.5)

## 2011-10-07 LAB — DIFFERENTIAL
Basophils Absolute: 0 10*3/uL (ref 0.0–0.1)
Basophils Relative: 0 % (ref 0–1)
Eosinophils Relative: 2 % (ref 0–5)
Lymphocytes Relative: 32 % (ref 12–46)
Monocytes Absolute: 0.3 10*3/uL (ref 0.1–1.0)
Neutro Abs: 2.9 10*3/uL (ref 1.7–7.7)

## 2011-10-07 LAB — URINALYSIS, ROUTINE W REFLEX MICROSCOPIC
Glucose, UA: NEGATIVE mg/dL
Hgb urine dipstick: NEGATIVE
Ketones, ur: NEGATIVE mg/dL
pH: 6.5 (ref 5.0–8.0)

## 2011-10-07 LAB — URINE MICROSCOPIC-ADD ON

## 2011-10-07 LAB — POCT I-STAT TROPONIN I

## 2011-10-07 MED ORDER — CIPROFLOXACIN HCL 500 MG PO TABS: 500.0000 mg | ORAL_TABLET | Freq: Two times a day (BID) | ORAL | Status: AC

## 2011-10-07 MED ORDER — CIPROFLOXACIN HCL 500 MG PO TABS
500.0000 mg | ORAL_TABLET | Freq: Once | ORAL | Status: AC
  Administered 2011-10-07: 500 mg via ORAL
  Filled 2011-10-07: qty 1

## 2011-10-07 MED ORDER — ACETAMINOPHEN 325 MG PO TABS
ORAL_TABLET | ORAL | Status: AC
  Filled 2011-10-07: qty 4

## 2011-10-07 MED ORDER — ACETAMINOPHEN 500 MG PO TABS
1000.0000 mg | ORAL_TABLET | ORAL | Status: AC
  Administered 2011-10-07: 975 mg via ORAL
  Filled 2011-10-07: qty 2

## 2011-10-07 NOTE — ED Notes (Signed)
Urin HAS been sent to lab. Sent at 20:20 hours

## 2011-10-07 NOTE — ED Notes (Signed)
Pt reports her pcp informed her she is having an irregular heart beat, dx her w/A-fib and wants to insert a pace maker

## 2011-10-07 NOTE — ED Notes (Signed)
Pt reports feeling weak with "dizzy spells," SOB w/exertion, and irregular heart beat x2-3 weeks, states "my ticker is not ticking right." Pt reports she started taking Hydralazine HCL 25 mg TID in April but reports she quit taking it approx 3 weeks ago, reports the medicine makes her feel weak and dizzy. Pt denies chest/back/abd pain, N/V/D, cough, or fever. Pt also reports hx of chest congestion.

## 2011-10-07 NOTE — ED Notes (Signed)
EMS-pt called EMS for progressively worsening weakness. On arrival ems noted HR in 40s. Denies any chest pain/sob. No hx of same. 22g(L)hand. 100% 2L [place]. CBG 80.

## 2011-10-07 NOTE — ED Provider Notes (Signed)
History     CSN: [id number]  Arrival date & time 10/07/11  1416   First MD Initiated Contact with Patient 10/07/11 1507      Chief Complaint  Patient presents with  . Bradycardia  . Fatigue    (Consider location/radiation/quality/duration/timing/severity/associated sxs/prior treatment) Patient is a 78 y.o. female presenting with weakness. The history is provided by the patient.  Weakness Primary symptoms do not include headaches, syncope, loss of consciousness, seizures, focal weakness, loss of sensation, speech change, fever, nausea or vomiting. Primary symptoms comment: generalized weakness The symptoms began yesterday. The symptoms are unchanged. The neurological symptoms are diffuse. Context: no precipitating sx.  Additional symptoms include weakness. Additional symptoms do not include lower back pain, loss of balance, photophobia, vertigo or irritability. Associated symptoms comments: Bilateral knee pain. Medical issues also include diabetes and hypertension. Medical issues do not include cerebral vascular accident.    Past Medical History  Diagnosis Date  . Hypertension   . Diabetes mellitus   . Asthma   . Hx of CABG   . Seizures   . Coronary artery disease   . Stroke     TIA    . Shortness of breath   . Arthritis   . Bradycardia   . Hyperlipidemia   . MI (myocardial infarction) 2009    Past Surgical History  Procedure Date  . Eye surgery   . Coronary angioplasty with stent placement     History reviewed. No pertinent family history.  History  Substance Use Topics  . Smoking status: Former Games developer  . Smokeless tobacco: Never Used  . Alcohol Use: No    OB History    Grav Para Term Preterm Abortions TAB SAB Ect Mult Living                  Review of Systems  Constitutional: Positive for fatigue. Negative for fever, chills, diaphoresis, appetite change and irritability.  Eyes: Negative for photophobia.  Respiratory: Negative for cough, shortness of  breath and wheezing.   Cardiovascular: Positive for leg swelling. Negative for chest pain and syncope.       Always has intermittent bilateral lower extremity edema  Gastrointestinal: Negative for nausea and vomiting.  Musculoskeletal: Negative for back pain and gait problem.  Neurological: Positive for weakness. Negative for vertigo, speech change, focal weakness, seizures, loss of consciousness, headaches and loss of balance.  Psychiatric/Behavioral: Negative for confusion.  All other systems reviewed and are negative.    Allergies  Nsaids  Home Medications   Current Outpatient Rx  Name Route Sig Dispense Refill  . FLUTICASONE-SALMETEROL 250-50 MCG/DOSE IN AEPB Inhalation Inhale 1 puff into the lungs every 12 (twelve) hours.    [name] HYDRALAZINE HCL 25 MG PO TABS Oral Take 25 mg by mouth 3 (three) times daily.    . INSULIN DETEMIR 100 UNIT/ML [place] SOLN Subcutaneous Inject 26 Units into the skin every morning.    [name] LOSARTAN POTASSIUM 50 MG PO TABS Oral Take 50 mg by mouth daily.    [name] MECLIZINE HCL 25 MG PO TABS Oral Take 25 mg by mouth 2 (two) times daily.    [name] PINDOLOL 5 MG PO TABS Oral Take 5 mg by mouth 2 (two) times daily.    [name] SIMVASTATIN 40 MG PO TABS Oral Take 40 mg by mouth every evening.    . TORSEMIDE 20 MG PO TABS Oral Take 20 mg by mouth 2 (two) times daily. Morning and at 3pm  BP 203/72  Pulse 67  Temp(Src) 98.3 F (36.8 C) (Oral)  Resp 23  Ht 5\' 2"  (1.575 m)  Wt 208 lb (94.348 kg)  BMI 38.04 kg/m2  SpO2 97%  Physical Exam  Nursing note and vitals reviewed. Constitutional: She is oriented to person, place, and time. She appears well-developed and well-nourished. No distress.  HENT:  Head: Normocephalic and atraumatic.  Mouth/Throat: Oropharynx is clear and moist.  Eyes: EOM are normal. Pupils are equal, round, and reactive to light.  Cardiovascular: Regular rhythm, normal heart sounds and intact distal pulses.  Bradycardia present.  Exam reveals no friction  rub.   No murmur heard. Pulmonary/Chest: Effort normal and breath sounds normal. She has no wheezes. She has no rales.  Abdominal: Soft. Bowel sounds are normal. She exhibits no distension. There is no tenderness. There is no rebound and no guarding.  Musculoskeletal: Normal range of motion. She exhibits edema. She exhibits no tenderness.       1+ edema in bilateral lower ext.  Unable to reproduce pain in bilateral knees except with ROM mild pain but no signs of septic joint (no erythema, warmth or swelling)  Neurological: She is alert and oriented to person, place, and time. No cranial nerve deficit.  Skin: Skin is warm and dry. No rash noted.  Psychiatric: She has a normal mood and affect. Her behavior is normal.    ED Course  Procedures (including critical care time)  Labs Reviewed  CBC - Abnormal; Notable for the following:    RBC 3.65 (*)    Hemoglobin 11.7 (*)    HCT 35.7 (*)    Platelets 136 (*)    All other components within normal limits  COMPREHENSIVE METABOLIC PANEL - Abnormal; Notable for the following:    Glucose, Bld 53 (*)    BUN 61 (*)    Creatinine, Ser 1.99 (*)    AST 64 (*)    ALT 40 (*)    GFR calc non Af Amer 22 (*)    GFR calc Af Amer 26 (*)    All other components within normal limits  URINALYSIS, ROUTINE W REFLEX MICROSCOPIC - Abnormal; Notable for the following:    Leukocytes, UA SMALL (*)    All other components within normal limits  PRO B NATRIURETIC PEPTIDE - Abnormal; Notable for the following:    Pro B Natriuretic peptide (BNP) 1953.0 (*)    All other components within normal limits  URINE MICROSCOPIC-ADD ON - Abnormal; Notable for the following:    Squamous Epithelial / LPF FEW (*)    Casts HYALINE CASTS (*)    All other components within normal limits  DIFFERENTIAL  POCT I-STAT TROPONIN I  URINE CULTURE   Dg Chest 2 View  10/07/2011  *RADIOLOGY REPORT*  Clinical Data: Weakness  CHEST - 2 VIEW  Comparison: 08/26/2011 and 10/22/2010  Findings:  Cardiomegaly again noted.  Status post CABG.  No acute infiltrate or pleural effusion.  No pulmonary edema.  Again noted osteopenia and mild generative changes thoracic spine.  IMPRESSION: No active disease.  Cardiomegaly again noted.  Status post CABG.  Original Report Authenticated By: Natasha Mead, M.D.     Date: 10/07/2011  Rate: 65  Rhythm: normal sinus rhythm  QRS Axis: normal  Intervals: normal  ST/T Wave abnormalities: nonspecific ST/T changes  Conduction Disutrbances:right bundle branch block  Narrative Interpretation:   Old EKG Reviewed: unchanged    No diagnosis found.    MDM   Patient complaining of  generalized fatigue and weakness over the last 2 days. She denies any other associated symptoms except that bilateral knees are achy which she attributes to the weather. Her heart rate ranges from 40-65 but she is on pindolol and her EKG is unchanged with a chronic right bundle branch block. She denies shortness of breath, chest pain, nausea, vomiting. She states she's been her typical amount and has 1+ edema in bilateral lower sternum and knees which she states comes and goes. She has a history of CABG, coronary artery disease, bradycardia and MI. However today she is not describing symptoms concerning for any cardiac etiology such as shortness of breath or chest pain. She is well-appearing on exam and blood pressure is normal with a heart rate again in the 50s and 60s on my exam which is typical for her. We'll check for heart strain, anemia or infection. CBC, CMP, UA, BMP, troponin, chest x-ray pending  8:44 PM CBC wnl.  BNP with Cr of 1.99 but pt ranges from 1.4-1.9.  BNP with signs of CHF however pt has mild swelling of the legs but denies SOB or orthopnea.  UA concerning for early infection and most likely what is causing her sx today.  Do not feel pt needs admission for CHF and can f/u with PCP on Friday.  Will start treatment for UTI and have return for worsening  symptoms.  Gwyneth Sprout, MD 10/07/11 2045

## 2011-10-07 NOTE — ED Notes (Signed)
Dr. Anitra Lauth aware of pt. Hr in 40's. Pt. Sleeping. When aroused, hr. Inc. 60's.

## 2011-10-07 NOTE — ED Notes (Signed)
PT states understanding of discharge instructions 

## 2011-10-09 LAB — URINE CULTURE: Colony Count: >=100000

## 2012-11-28 ENCOUNTER — Emergency Department (HOSPITAL_COMMUNITY): Payer: Medicare Other

## 2012-11-28 ENCOUNTER — Emergency Department (HOSPITAL_COMMUNITY)
Admission: EM | Admit: 2012-11-28 | Discharge: 2012-11-28 | Disposition: A | Discharge status: Home / Self Care | Payer: Medicare Other | Attending: Emergency Medicine | Admitting: Emergency Medicine

## 2012-11-28 ENCOUNTER — Encounter (HOSPITAL_COMMUNITY): Payer: Self-pay | Admitting: Emergency Medicine

## 2012-11-28 DIAGNOSIS — Z8679 Personal history of other diseases of the circulatory system: Secondary | ICD-10-CM | POA: Insufficient documentation

## 2012-11-28 DIAGNOSIS — E785 Hyperlipidemia, unspecified: Secondary | ICD-10-CM | POA: Insufficient documentation

## 2012-11-28 DIAGNOSIS — I252 Old myocardial infarction: Secondary | ICD-10-CM | POA: Insufficient documentation

## 2012-11-28 DIAGNOSIS — Z794 Long term (current) use of insulin: Secondary | ICD-10-CM | POA: Insufficient documentation

## 2012-11-28 DIAGNOSIS — Z8669 Personal history of other diseases of the nervous system and sense organs: Secondary | ICD-10-CM | POA: Insufficient documentation

## 2012-11-28 DIAGNOSIS — M542 Cervicalgia: Secondary | ICD-10-CM | POA: Insufficient documentation

## 2012-11-28 DIAGNOSIS — Z951 Presence of aortocoronary bypass graft: Secondary | ICD-10-CM | POA: Insufficient documentation

## 2012-11-28 DIAGNOSIS — I2510 Atherosclerotic heart disease of native coronary artery without angina pectoris: Secondary | ICD-10-CM | POA: Insufficient documentation

## 2012-11-28 DIAGNOSIS — J45909 Unspecified asthma, uncomplicated: Secondary | ICD-10-CM | POA: Insufficient documentation

## 2012-11-28 DIAGNOSIS — Z8739 Personal history of other diseases of the musculoskeletal system and connective tissue: Secondary | ICD-10-CM | POA: Insufficient documentation

## 2012-11-28 DIAGNOSIS — Z8673 Personal history of transient ischemic attack (TIA), and cerebral infarction without residual deficits: Secondary | ICD-10-CM | POA: Insufficient documentation

## 2012-11-28 DIAGNOSIS — IMO0002 Reserved for concepts with insufficient information to code with codable children: Secondary | ICD-10-CM | POA: Insufficient documentation

## 2012-11-28 DIAGNOSIS — I10 Essential (primary) hypertension: Secondary | ICD-10-CM | POA: Insufficient documentation

## 2012-11-28 DIAGNOSIS — Z79899 Other long term (current) drug therapy: Secondary | ICD-10-CM | POA: Insufficient documentation

## 2012-11-28 DIAGNOSIS — M549 Dorsalgia, unspecified: Secondary | ICD-10-CM | POA: Insufficient documentation

## 2012-11-28 DIAGNOSIS — Z9861 Coronary angioplasty status: Secondary | ICD-10-CM | POA: Insufficient documentation

## 2012-11-28 DIAGNOSIS — E119 Type 2 diabetes mellitus without complications: Secondary | ICD-10-CM | POA: Insufficient documentation

## 2012-11-28 MED ORDER — CYCLOBENZAPRINE HCL 10 MG PO TABS: 10.0000 mg | ORAL_TABLET | Freq: Three times a day (TID) | ORAL | Status: DC | PRN

## 2012-11-28 NOTE — ED Provider Notes (Addendum)
CSN: [id number]     Arrival date & time 11/28/12  0856 History     First MD Initiated Contact with Patient 11/28/12 0913     Chief Complaint  Patient presents with  . Neck Pain  . Back Pain    right   (Consider location/radiation/quality/duration/timing/severity/associated sxs/prior Treatment) HPI Comments: Patient woke this morning with pain in the right side of the neck.  She denies any injury or trauma.  It is worse with turning her head and palpation and relieved with rest.  She has not taken anything and does not want any medications for this.  Patient is a 79 y.o. female presenting with neck pain. The history is provided by the patient.  Neck Pain Pain location:  R side Quality:  Aching Pain radiates to:  Does not radiate Pain severity:  Moderate Pain is:  Same all the time Onset quality:  Sudden Duration:  12 hours Timing:  Constant Progression:  Unchanged Chronicity:  New Context: not fall, not lifting a heavy object and not recent injury   Relieved by:  Nothing Exacerbated by: palpation, turning head. Ineffective treatments:  None tried Associated symptoms: no paresis   Risk factors: no hx of head and neck radiation and no hx of spinal trauma     Past Medical History  Diagnosis Date  . Hypertension   . Diabetes mellitus   . Asthma   . Hx of CABG   . Seizures   . Coronary artery disease   . Stroke     TIA    . Shortness of breath   . Arthritis   . Bradycardia   . Hyperlipidemia   . MI (myocardial infarction) 2009   Past Surgical History  Procedure Laterality Date  . Eye surgery    . Coronary angioplasty with stent placement     No family history on file. History  Substance Use Topics  . Smoking status: Former Games developer  . Smokeless tobacco: Never Used  . Alcohol Use: No   OB History   Grav Para Term Preterm Abortions TAB SAB Ect Mult Living                 Review of Systems  HENT: Positive for neck pain.   All other systems reviewed and are  negative.    Allergies  Nsaids  Home Medications   Current Outpatient Rx  Name  Route  Sig  Dispense  Refill  . Fluticasone-Salmeterol (ADVAIR) 250-50 MCG/DOSE AEPB   Inhalation   Inhale 1 puff into the lungs every 12 (twelve) hours.         . hydrALAZINE (APRESOLINE) 25 MG tablet   Oral   Take 25 mg by mouth 3 (three) times daily.         . insulin detemir (LEVEMIR) 100 UNIT/ML injection   Subcutaneous   Inject 26 Units into the skin every morning.         [name] losartan (COZAAR) 50 MG tablet   Oral   Take 50 mg by mouth daily.         . meclizine (ANTIVERT) 25 MG tablet   Oral   Take 25 mg by mouth 2 (two) times daily.         . pindolol (VISKEN) 5 MG tablet   Oral   Take 5 mg by mouth 2 (two) times daily.         . simvastatin (ZOCOR) 40 MG tablet   Oral   Take 40 mg by  mouth every evening.         . torsemide (DEMADEX) 20 MG tablet   Oral   Take 20 mg by mouth 2 (two) times daily. Morning and at 3pm          There were no vitals taken for this visit. Physical Exam  Nursing note and vitals reviewed. Constitutional: She is oriented to person, place, and time. She appears well-developed and well-nourished. No distress.  HENT:  Head: Normocephalic and atraumatic.  Mouth/Throat: Oropharynx is clear and moist.  Neck: Neck supple.  There is ttp over the soft tissues of the right side of the lower posterior neck.  There is no bony ttp or stepoffs.  There is pain with turning head to the right, but is able to turn to the left without difficulty.  Cardiovascular: Normal rate, regular rhythm and normal heart sounds.   No murmur heard. Pulmonary/Chest: Effort normal and breath sounds normal.  Musculoskeletal: Normal range of motion. She exhibits no edema.  Neurological: She is alert and oriented to person, place, and time. No cranial nerve deficit. Coordination normal.  Strength is 5/5 in the BUE without deficit.  Sensation is intact in the BUE.    Skin:  She is not diaphoretic.    ED Course   Procedures (including critical care time)  Labs Reviewed - No data to display No results found. No diagnosis found.  MDM  The xrays are negative.  I suspect a muscular etiology.  There is no evidence for nerve impingement and she is ambulatory in the ED without difficulty.  Will treat with nsaids, flexeril.  Return prn.  Geoffery Lyons, MD 11/28/12 1130  Geoffery Lyons, MD 11/28/12 [phone]

## 2012-11-28 NOTE — ED Notes (Signed)
Family requesting to speak to MD regarding BP

## 2012-11-28 NOTE — ED Notes (Signed)
[id number]<AV> Expected date:<BR> Expected time:<BR> Means of arrival:<BR> Comments:<BR> Neck/back pain

## 2012-11-28 NOTE — ED Notes (Signed)
Per EMS: pt c/o of neck and back pain since 2300 last night. 10/10

## 2013-09-02 ENCOUNTER — Encounter: Payer: Self-pay | Admitting: *Deleted

## 2014-11-12 ENCOUNTER — Other Ambulatory Visit: Payer: Self-pay | Admitting: Family Medicine

## 2014-11-12 ENCOUNTER — Ambulatory Visit
Admission: RE | Admit: 2014-11-12 | Discharge: 2014-11-12 | Disposition: A | Discharge status: Home / Self Care | Payer: No Typology Code available for payment source | Source: Ambulatory Visit | Attending: Family Medicine | Admitting: Family Medicine

## 2014-11-12 DIAGNOSIS — M25511 Pain in right shoulder: Secondary | ICD-10-CM

## 2015-04-24 ENCOUNTER — Telehealth: Payer: Self-pay | Admitting: Cardiology

## 2015-04-24 NOTE — Telephone Encounter (Signed)
Records received [facility] with Dr[facility]Hochrein 06/03/15.

## 2015-05-01 ENCOUNTER — Telehealth: Payer: Self-pay | Admitting: Cardiology

## 2015-05-01 ENCOUNTER — Ambulatory Visit
Admission: RE | Admit: 2015-05-01 | Discharge: 2015-05-01 | Disposition: A | Discharge status: Home / Self Care | Payer: No Typology Code available for payment source | Source: Ambulatory Visit | Attending: Internal Medicine | Admitting: Internal Medicine

## 2015-05-01 ENCOUNTER — Other Ambulatory Visit: Payer: Self-pay | Admitting: Internal Medicine

## 2015-05-01 DIAGNOSIS — R609 Edema, unspecified: Secondary | ICD-10-CM

## 2015-05-01 NOTE — Telephone Encounter (Signed)
Received records from [facility] for appointment on 06/03/15 with Dr Antoine PocheHochrein.  [facility] for Dr Hochrein's schedule on 06/03/15. lp

## 2015-05-02 ENCOUNTER — Encounter (HOSPITAL_COMMUNITY): Payer: Self-pay | Admitting: *Deleted

## 2015-05-02 ENCOUNTER — Emergency Department (HOSPITAL_COMMUNITY): Payer: Medicare (Managed Care)

## 2015-05-02 ENCOUNTER — Inpatient Hospital Stay (HOSPITAL_COMMUNITY)
Admission: EM | Admit: 2015-05-02 | Discharge: 2015-05-15 | DRG: 308 | Disposition: A | Discharge status: Skilled Nursing Facility | Payer: Medicare (Managed Care) | Attending: Cardiology | Admitting: Cardiology

## 2015-05-02 ENCOUNTER — Other Ambulatory Visit (HOSPITAL_COMMUNITY): Payer: Self-pay | Admitting: Family Medicine

## 2015-05-02 ENCOUNTER — Emergency Department (HOSPITAL_BASED_OUTPATIENT_CLINIC_OR_DEPARTMENT_OTHER): Payer: Medicare (Managed Care)

## 2015-05-02 DIAGNOSIS — R0689 Other abnormalities of breathing: Secondary | ICD-10-CM

## 2015-05-02 DIAGNOSIS — I483 Typical atrial flutter: Secondary | ICD-10-CM | POA: Diagnosis not present

## 2015-05-02 DIAGNOSIS — I319 Disease of pericardium, unspecified: Secondary | ICD-10-CM

## 2015-05-02 DIAGNOSIS — I5021 Acute systolic (congestive) heart failure: Secondary | ICD-10-CM | POA: Diagnosis present

## 2015-05-02 DIAGNOSIS — J45909 Unspecified asthma, uncomplicated: Secondary | ICD-10-CM | POA: Diagnosis present

## 2015-05-02 DIAGNOSIS — I509 Heart failure, unspecified: Secondary | ICD-10-CM | POA: Insufficient documentation

## 2015-05-02 DIAGNOSIS — R001 Bradycardia, unspecified: Secondary | ICD-10-CM

## 2015-05-02 DIAGNOSIS — Z79899 Other long term (current) drug therapy: Secondary | ICD-10-CM

## 2015-05-02 DIAGNOSIS — E1122 Type 2 diabetes mellitus with diabetic chronic kidney disease: Secondary | ICD-10-CM | POA: Diagnosis present

## 2015-05-02 DIAGNOSIS — Z951 Presence of aortocoronary bypass graft: Secondary | ICD-10-CM

## 2015-05-02 DIAGNOSIS — R52 Pain, unspecified: Secondary | ICD-10-CM

## 2015-05-02 DIAGNOSIS — I495 Sick sinus syndrome: Principal | ICD-10-CM | POA: Diagnosis present

## 2015-05-02 DIAGNOSIS — Z955 Presence of coronary angioplasty implant and graft: Secondary | ICD-10-CM

## 2015-05-02 DIAGNOSIS — R062 Wheezing: Secondary | ICD-10-CM | POA: Insufficient documentation

## 2015-05-02 DIAGNOSIS — M109 Gout, unspecified: Secondary | ICD-10-CM | POA: Diagnosis present

## 2015-05-02 DIAGNOSIS — I429 Cardiomyopathy, unspecified: Secondary | ICD-10-CM | POA: Diagnosis present

## 2015-05-02 DIAGNOSIS — M7989 Other specified soft tissue disorders: Secondary | ICD-10-CM

## 2015-05-02 DIAGNOSIS — M1990 Unspecified osteoarthritis, unspecified site: Secondary | ICD-10-CM | POA: Diagnosis present

## 2015-05-02 DIAGNOSIS — E785 Hyperlipidemia, unspecified: Secondary | ICD-10-CM | POA: Diagnosis present

## 2015-05-02 DIAGNOSIS — J449 Chronic obstructive pulmonary disease, unspecified: Secondary | ICD-10-CM | POA: Diagnosis present

## 2015-05-02 DIAGNOSIS — I4892 Unspecified atrial flutter: Secondary | ICD-10-CM | POA: Diagnosis present

## 2015-05-02 DIAGNOSIS — R609 Edema, unspecified: Secondary | ICD-10-CM

## 2015-05-02 DIAGNOSIS — I340 Nonrheumatic mitral (valve) insufficiency: Secondary | ICD-10-CM | POA: Diagnosis present

## 2015-05-02 DIAGNOSIS — I252 Old myocardial infarction: Secondary | ICD-10-CM

## 2015-05-02 DIAGNOSIS — I130 Hypertensive heart and chronic kidney disease with heart failure and stage 1 through stage 4 chronic kidney disease, or unspecified chronic kidney disease: Secondary | ICD-10-CM | POA: Diagnosis present

## 2015-05-02 DIAGNOSIS — R000 Tachycardia, unspecified: Secondary | ICD-10-CM

## 2015-05-02 DIAGNOSIS — I5023 Acute on chronic systolic (congestive) heart failure: Secondary | ICD-10-CM | POA: Diagnosis present

## 2015-05-02 DIAGNOSIS — Z8673 Personal history of transient ischemic attack (TIA), and cerebral infarction without residual deficits: Secondary | ICD-10-CM

## 2015-05-02 DIAGNOSIS — N184 Chronic kidney disease, stage 4 (severe): Secondary | ICD-10-CM | POA: Diagnosis present

## 2015-05-02 DIAGNOSIS — R0600 Dyspnea, unspecified: Secondary | ICD-10-CM

## 2015-05-02 DIAGNOSIS — Z87891 Personal history of nicotine dependence: Secondary | ICD-10-CM

## 2015-05-02 DIAGNOSIS — I2510 Atherosclerotic heart disease of native coronary artery without angina pectoris: Secondary | ICD-10-CM | POA: Diagnosis present

## 2015-05-02 DIAGNOSIS — I6529 Occlusion and stenosis of unspecified carotid artery: Secondary | ICD-10-CM | POA: Diagnosis present

## 2015-05-02 DIAGNOSIS — R079 Chest pain, unspecified: Secondary | ICD-10-CM

## 2015-05-02 HISTORY — DX: Type 2 diabetes mellitus without complications: E11.9

## 2015-05-02 HISTORY — DX: Non-ST elevation (NSTEMI) myocardial infarction: I21.4

## 2015-05-02 HISTORY — DX: Chronic kidney disease, unspecified: N18.9

## 2015-05-02 HISTORY — DX: Heart failure, unspecified: I50.9

## 2015-05-02 LAB — BASIC METABOLIC PANEL
ANION GAP: 11 (ref 5–15)
BUN: 43 mg/dL — AB (ref 6–20)
CHLORIDE: 104 mmol/L (ref 101–111)
CO2: 30 mmol/L (ref 22–32)
Calcium: 9.3 mg/dL (ref 8.9–10.3)
Creatinine, Ser: 1.86 mg/dL — ABNORMAL HIGH (ref 0.44–1.00)
GFR calc Af Amer: 27 mL/min — ABNORMAL LOW (ref >60)
GFR calc non Af Amer: 24 mL/min — ABNORMAL LOW (ref >60)
GLUCOSE: 94 mg/dL (ref 65–99)
POTASSIUM: 4.6 mmol/L (ref 3.5–5.1)
Sodium: 145 mmol/L (ref 135–145)

## 2015-05-02 LAB — CBC WITH DIFFERENTIAL/PLATELET
BASOS ABS: 0 10*3/uL (ref 0.0–0.1)
Basophils Relative: 0 %
Eosinophils Absolute: 0 10*3/uL (ref 0.0–0.7)
Eosinophils Relative: 1 %
HEMATOCRIT: 38.8 % (ref 36.0–46.0)
Hemoglobin: 12 g/dL (ref 12.0–15.0)
LYMPHS ABS: 0.8 10*3/uL (ref 0.7–4.0)
LYMPHS PCT: 24 %
MCH: 30.9 pg (ref 26.0–34.0)
MCHC: 30.9 g/dL (ref 30.0–36.0)
MCV: 100 fL (ref 78.0–100.0)
Monocytes Absolute: 0.4 10*3/uL (ref 0.1–1.0)
Monocytes Relative: 11 %
NEUTROS ABS: 2.2 10*3/uL (ref 1.7–7.7)
Neutrophils Relative %: 64 %
Platelets: 124 10*3/uL — ABNORMAL LOW (ref 150–400)
RBC: 3.88 MIL/uL (ref 3.87–5.11)
RDW: 13.7 % (ref 11.5–15.5)
WBC: 3.4 10*3/uL — AB (ref 4.0–10.5)

## 2015-05-02 LAB — I-STAT TROPONIN, ED: Troponin i, poc: 0.05 ng/mL (ref 0.00–0.08)

## 2015-05-02 LAB — BRAIN NATRIURETIC PEPTIDE: B Natriuretic Peptide: 618.3 pg/mL — ABNORMAL HIGH (ref 0.0–100.0)

## 2015-05-02 LAB — MAGNESIUM: Magnesium: 1.9 mg/dL (ref 1.7–2.4)

## 2015-05-02 MED ORDER — ALBUTEROL SULFATE (2.5 MG/3ML) 0.083% IN NEBU
2.5000 mg | INHALATION_SOLUTION | RESPIRATORY_TRACT | Status: DC | PRN
  Administered 2015-05-03 – 2015-05-09 (×8): 2.5 mg via RESPIRATORY_TRACT
  Filled 2015-05-02 (×8): qty 3

## 2015-05-02 MED ORDER — ATORVASTATIN CALCIUM 20 MG PO TABS
20.0000 mg | ORAL_TABLET | Freq: Every day | ORAL | Status: DC
  Administered 2015-05-02 – 2015-05-14 (×13): 20 mg via ORAL
  Filled 2015-05-02 (×13): qty 1

## 2015-05-02 MED ORDER — ACETAMINOPHEN 325 MG PO TABS: 650.0000 mg | ORAL_TABLET | ORAL | Status: DC | PRN

## 2015-05-02 MED ORDER — SODIUM CHLORIDE 0.9 % IV SOLN: 250.0000 mL | INTRAVENOUS | Status: DC | PRN

## 2015-05-02 MED ORDER — FUROSEMIDE 10 MG/ML IJ SOLN
40.0000 mg | Freq: Once | INTRAMUSCULAR | Status: AC
  Administered 2015-05-02: 40 mg via INTRAVENOUS
  Filled 2015-05-02: qty 4

## 2015-05-02 MED ORDER — HYPROMELLOSE (GONIOSCOPIC) 2.5 % OP SOLN
1.0000 [drp] | Freq: Four times a day (QID) | OPHTHALMIC | Status: DC
  Administered 2015-05-02 – 2015-05-13 (×22): 1 [drp] via OPHTHALMIC
  Filled 2015-05-02: qty 15

## 2015-05-02 MED ORDER — ALBUTEROL SULFATE HFA 108 (90 BASE) MCG/ACT IN AERS: 2.0000 | INHALATION_SPRAY | RESPIRATORY_TRACT | Status: DC | PRN

## 2015-05-02 MED ORDER — GABAPENTIN 300 MG PO CAPS
300.0000 mg | ORAL_CAPSULE | Freq: Every day | ORAL | Status: DC
  Administered 2015-05-02 – 2015-05-14 (×13): 300 mg via ORAL
  Filled 2015-05-02 (×13): qty 1

## 2015-05-02 MED ORDER — NITROGLYCERIN 0.4 MG SL SUBL: 0.4000 mg | SUBLINGUAL_TABLET | SUBLINGUAL | Status: DC | PRN

## 2015-05-02 MED ORDER — TORSEMIDE 20 MG PO TABS
20.0000 mg | ORAL_TABLET | Freq: Every day | ORAL | Status: DC
  Administered 2015-05-02: 20 mg via ORAL
  Filled 2015-05-02: qty 1

## 2015-05-02 MED ORDER — ALBUTEROL SULFATE (2.5 MG/3ML) 0.083% IN NEBU
2.5000 mg | INHALATION_SOLUTION | Freq: Once | RESPIRATORY_TRACT | Status: AC
  Administered 2015-05-02: 2.5 mg via RESPIRATORY_TRACT
  Filled 2015-05-02: qty 3

## 2015-05-02 MED ORDER — ONDANSETRON HCL 4 MG/2ML IJ SOLN: 4.0000 mg | Freq: Four times a day (QID) | INTRAMUSCULAR | Status: DC | PRN

## 2015-05-02 MED ORDER — IPRATROPIUM BROMIDE 0.02 % IN SOLN
0.5000 mg | Freq: Once | RESPIRATORY_TRACT | Status: AC
  Administered 2015-05-02: 0.5 mg via RESPIRATORY_TRACT
  Filled 2015-05-02: qty 2.5

## 2015-05-02 MED ORDER — ACETAMINOPHEN 325 MG PO TABS
650.0000 mg | ORAL_TABLET | Freq: Four times a day (QID) | ORAL | Status: DC | PRN
  Filled 2015-05-02: qty 2

## 2015-05-02 MED ORDER — SODIUM CHLORIDE 0.9 % IJ SOLN
3.0000 mL | INTRAMUSCULAR | Status: DC | PRN
  Administered 2015-05-04 – 2015-05-10 (×2): 3 mL via INTRAVENOUS
  Filled 2015-05-02 (×2): qty 3

## 2015-05-02 MED ORDER — TORSEMIDE 20 MG PO TABS
40.0000 mg | ORAL_TABLET | ORAL | Status: DC
  Administered 2015-05-03: 40 mg via ORAL
  Filled 2015-05-02: qty 2

## 2015-05-02 MED ORDER — LOSARTAN POTASSIUM 50 MG PO TABS
50.0000 mg | ORAL_TABLET | Freq: Every day | ORAL | Status: DC
  Administered 2015-05-02 – 2015-05-11 (×10): 50 mg via ORAL
  Filled 2015-05-02 (×11): qty 1

## 2015-05-02 MED ORDER — HYDRALAZINE HCL 25 MG PO TABS
25.0000 mg | ORAL_TABLET | Freq: Three times a day (TID) | ORAL | Status: DC
  Administered 2015-05-03 – 2015-05-10 (×22): 25 mg via ORAL
  Filled 2015-05-02 (×24): qty 1

## 2015-05-02 MED ORDER — VITAMIN D 1000 UNITS PO TABS
1000.0000 [IU] | ORAL_TABLET | Freq: Every day | ORAL | Status: DC
  Administered 2015-05-02 – 2015-05-15 (×14): 1000 [IU] via ORAL
  Filled 2015-05-02 (×14): qty 1

## 2015-05-02 MED ORDER — SODIUM CHLORIDE 0.9 % IJ SOLN
3.0000 mL | Freq: Two times a day (BID) | INTRAMUSCULAR | Status: DC
  Administered 2015-05-02 – 2015-05-15 (×24): 3 mL via INTRAVENOUS

## 2015-05-02 NOTE — ED Provider Notes (Signed)
CSN: [id number]     Arrival date & time 05/02/15  1325 History   First MD Initiated Contact with Patient 05/02/15 1326     Chief Complaint  Patient presents with  . Irregular Heart Beat     (Consider location/radiation/quality/duration/timing/severity/associated sxs/prior Treatment) Patient is a 81 y.o. female presenting with weakness. The history is provided by the patient.  Weakness This is a new problem. The current episode started more than 1 month ago. The problem occurs constantly. The problem has been rapidly worsening. Associated symptoms include fatigue and weakness. Pertinent negatives include no abdominal pain, anorexia, arthralgias, change in bowel habit, chest pain, chills, congestion, coughing, diaphoresis, fever, headaches, joint swelling, myalgias, nausea, neck pain, numbness, rash, sore throat, swollen glands, urinary symptoms, vertigo, visual change or vomiting. Associated symptoms comments: Pt with reported intermittent bradycardia at physical therapy today. The symptoms are aggravated by exertion. She has tried rest for the symptoms. The treatment provided no relief.    Past Medical History  Diagnosis Date  . Hypertension   . Diabetes mellitus   . Asthma   . Hx of CABG   . Seizures (HCC)   . Coronary artery disease   . Stroke [facility])     TIA    . Shortness of breath   . Arthritis   . Bradycardia   . Hyperlipidemia   . MI (myocardial infarction) (HCC) 2009   Past Surgical History  Procedure Laterality Date  . Eye surgery    . Coronary angioplasty with stent placement     Family History  Problem Relation Age of Onset  . Heart disease Father    Social History  Substance Use Topics  . Smoking status: Former Games developer  . Smokeless tobacco: Never Used  . Alcohol Use: No   OB History    No data available     Review of Systems  Constitutional: Positive for fatigue. Negative for fever, chills and diaphoresis.  HENT: Negative for congestion and sore throat.     Eyes: Negative for pain.  Respiratory: Negative for cough.   Cardiovascular: Positive for palpitations and leg swelling. Negative for chest pain.  Gastrointestinal: Negative for nausea, vomiting, abdominal pain, anorexia and change in bowel habit.  Genitourinary: Negative for dysuria.  Musculoskeletal: Negative for myalgias, joint swelling, arthralgias and neck pain.  Skin: Negative for rash.  Neurological: Positive for weakness. Negative for vertigo, numbness and headaches.      Allergies  Nsaids  Home Medications   Prior to Admission medications   Medication Sig Start Date End Date Taking? Authorizing Provider  cyclobenzaprine (FLEXERIL) 10 MG tablet Take 1 tablet (10 mg total) by mouth 3 (three) times daily as needed for muscle spasms. 11/28/12   Geoffery Lyons, MD  Fluticasone-Salmeterol (ADVAIR) 250-50 MCG/DOSE AEPB Inhale 1 puff into the lungs every 12 (twelve) hours.    Historical Provider, MD  GOLDEN SEAL PO Take 1 tablet by mouth daily as needed (takes when her allergies start to flare up).    Historical Provider, MD  hydrALAZINE (APRESOLINE) 25 MG tablet Take 25 mg by mouth 3 (three) times daily.    Historical Provider, MD  insulin detemir (LEVEMIR) 100 UNIT/ML injection Inject 26 Units into the skin every morning.    Historical Provider, MD  losartan (COZAAR) 50 MG tablet Take 50 mg by mouth daily.    Historical Provider, MD  pindolol (VISKEN) 5 MG tablet Take 5 mg by mouth 2 (two) times daily.    Historical Provider, MD  PROVENTIL  HFA 108 (90 Base) MCG/ACT inhaler Inhale 2 puffs into the lungs every 4 (four) hours as needed for wheezing or shortness of breath.  04/08/15   Historical Provider, MD  simvastatin (ZOCOR) 40 MG tablet Take 40 mg by mouth every evening.    Historical Provider, MD  torsemide (DEMADEX) 20 MG tablet Take 20 mg by mouth 2 (two) times daily. Morning and at 3pm    Historical Provider, MD   BP 152/104 mmHg  Pulse 66  Temp(Src) 98.7 F (37.1 C) (Oral)   Resp 19  SpO2 92% Physical Exam  Constitutional: She appears well-developed and well-nourished. She appears ill.  HENT:  Head: Head is without abrasion and without contusion.  Mouth/Throat: Oropharynx is clear and moist and mucous membranes are normal.  Eyes: Conjunctivae and EOM are normal. Pupils are equal, round, and reactive to light.  Neck: Trachea normal and normal range of motion.  Cardiovascular: Regular rhythm.  Tachycardia present.  Exam reveals no decreased pulses.   Murmur heard.  Systolic (best appreciated at left sternal border) murmur is present with a grade of 3/6  Pulmonary/Chest: Effort normal. She has wheezes in the right upper field, the right middle field, the right lower field, the left upper field, the left middle field and the left lower field.  Abdominal: Normal appearance and bowel sounds are normal. There is no tenderness. There is no rigidity, no guarding, no CVA tenderness, no tenderness at McBurney's point and negative Murphy's sign.  Musculoskeletal:       Right lower leg: She exhibits swelling.       Left lower leg: She exhibits swelling.  Neurological: She is alert. She has normal strength. No cranial nerve deficit or sensory deficit. GCS eye subscore is 4. GCS verbal subscore is 5. GCS motor subscore is 6.  Skin: No burn and no rash noted. No erythema.     Psychiatric: She has a normal mood and affect. Her speech is normal. Cognition and memory are normal.    ED Course  Procedures (including critical care time) Labs Review Labs Reviewed  BRAIN NATRIURETIC PEPTIDE - Abnormal; Notable for the following:    B Natriuretic Peptide 618.3 (*)    All other components within normal limits  CBC WITH DIFFERENTIAL/PLATELET - Abnormal; Notable for the following:    WBC 3.4 (*)    Platelets 124 (*)    All other components within normal limits  BASIC METABOLIC PANEL - Abnormal; Notable for the following:    BUN 43 (*)    Creatinine, Ser 1.86 (*)    GFR calc  non Af Amer 24 (*)    GFR calc Af Amer 27 (*)    All other components within normal limits  MAGNESIUM  I-STAT TROPOININ, ED    Imaging Review Dg Chest 2 View  05/02/2015  CLINICAL DATA:  81 year old female with hypotension and generalized weakness. EXAM: CHEST  2 VIEW COMPARISON:  Chest x-ray 05/01/2015. FINDINGS: Lung volumes are low. No acute consolidative airspace disease. Small bilateral pleural effusions. Cephalization of the pulmonary vasculature, with indistinct interstitial markings, suggesting mild interstitial pulmonary edema. Enlargement of the cardiopericardial silhouette which has a "water bottle" appearance, which could suggest an enlarging pericardial effusion. This is similar to the recent prior study, but is clearly new compared to more remote prior examination from 10/07/2011. Upper mediastinal contour is distorted by patient's rotation of the right. Atherosclerosis in the thoracic aorta. Status post median sternotomy for CABG. IMPRESSION: 1. Progressive enlargement of the cardiopericardial silhouette  which now has an appearance most concerning for enlarging pericardial effusion. Correlation with echocardiography is strongly recommended. 2. In addition the cardiac enlargement there is cephalization of the pulmonary vasculature, and evidence of mild interstitial pulmonary edema and small bilateral pleural effusions; findings concerning for congestive heart failure. 3. Atherosclerosis. Electronically Signed   By: Trudie Reed M.D.   On: 05/02/2015 14:46   Dg Chest 2 View  05/01/2015  CLINICAL DATA:  Initial encounter for several day history of shortness of breath. EXAM: CHEST  2 VIEW COMPARISON:  10/07/2011. FINDINGS: AP and lateral views of the chest show hyperexpansion without focal airspace consolidation. No pulmonary edema or pleural effusion. Interstitial markings are diffusely coarsened with chronic features. The cardio pericardial silhouette is enlarged. Patient is status  post CABG. Bones are diffusely demineralized. IMPRESSION: Cardiomegaly with hyperexpansion and chronic interstitial lung disease. No acute cardiopulmonary findings. Electronically Signed   By: Kennith Center M.D.   On: 05/01/2015 16:46   I have personally reviewed and evaluated these images and lab results as part of my medical decision-making.   EKG Interpretation   Date/Time:  Thursday May 02 2015 13:37:26 EST Ventricular Rate:  97 PR Interval:  169 QRS Duration: 148 QT Interval:  399 QTC Calculation: 507 R Axis:   -160 Text Interpretation:  Sinus rhythm RBBB and LPFB Baseline wander in  lead(s) V4 borderline ST depression/t wave inversion in V2-V3 new from  previous Confirmed by LITTLE MD, RACHEL [phone]) on 05/02/2015 1:52:52 PM       EMERGENCY DEPARTMENT [place] CARDIAC EXAM "Study: Limited Ultrasound of the heart and pericardium"  INDICATIONS:Tachycardia Multiple views of the heart and pericardium were obtained in real-time with a multi-frequency probe.  PERFORMED UE:AVWUJW  IMAGES ARCHIVED?: Yes  FINDINGS: No pericardial effusion, Decreased contractility and Tamponade physiology absent  LIMITATIONS:  Body habitus  VIEWS USED: Subcostal 4 chamber, Parasternal long axis, Parasternal short axis and Apical 4 chamber   INTERPRETATION: Cardiac activity present, Pericardial effusioin absent, Cardiac tamponade absent and Decreased contractility   MDM   Final diagnoses:  Leg swelling  Bradycardia  Congestive heart failure, unspecified congestive heart failure chronicity, unspecified congestive heart failure type [facility])  Tachycardia    6 year old African-American female with past medical history of heart failure, reported sick sinus syndrome presents in setting of fatigue and bradycardia. Per patient and primary care physician (Dr. Mayford Knife) she has had 2-3 months of worsening fatigue and bilateral lower extremity swelling. Patient was undergoing outpatient workup for this  and had recent creatinine levels which were 1.6-1.8. Patient has follow-up in January with cardiology for further management of these symptoms. However today at rehabilitation patient became acutely weak and that time she was noted to have heart rate in the 30s. Heart rate resolved without intervention and then was noted to be in the 130s. This was concerning for reoccurrence of patient's sick sinus syndrome. Patient brought via EMS for further management.  On arrival patient was tachycardic and hypertensive. Patient's oxygen saturation is within normal limits on room air. She did have bilateral end expiratory wheezing concerning for cardiac etiology. Patient had a 2/6 systolic murmur best appreciated at left sternal border concerning for pulmonic valve etiology. She does have 3+ bilateral pitting edema in lower extremities. Considering these findings I have concerns for worsening heart failure versus ACS versus sick sinus syndrome versus pulmonary hypertension. We'll obtain basic laboratory analysis, chest x-ray, BNP, troponin.  EKG revealed right bundle branch block and no significant change from patient's. No acute  ischemia noted at this time. Patient's BNP was elevated. No significant elevation in troponin. Patient did have elevation in creatinine to 1.86. Chest x-ray concerning for possible pericardial effusion and congestive heart failure. Bedside ultrasound performed that did not reveal pericardial effusion. Global hypodynamic contractility noticed. Pt given 40 mg IV lasix for likely worsening CHF.  Due to presentation concerning for worsening sick sinus syndrome and CHF case discussed with cardiology team.  Care of patient transferred to Dr. Katrinka Blazing at 1600. Plan is to await cardiology evaluation. If cardiology does not want to admit to their service patient will need admission to medicine for further management of these conditions. Patient stable at time of transfer of care.  Attending has seen and  evaluated patient and Dr. Clarene Duke is in agreement with plan.   Stacy Gardner, MD 05/02/15 1557  Laurence Spates, MD 05/03/15 [phone]

## 2015-05-02 NOTE — ED Notes (Signed)
Per EMS- [facility] when they noticed her BP drop to 30 and then increase to 130. Pt reports some generalized weakness at that time. Pt denies any complaints at this time.

## 2015-05-02 NOTE — ED Notes (Signed)
MD at bedside. 

## 2015-05-02 NOTE — ED Notes (Signed)
Echo at bedside

## 2015-05-02 NOTE — ED Notes (Signed)
Attempted report 

## 2015-05-02 NOTE — ED Notes (Signed)
Pt transporting to X-ray

## 2015-05-02 NOTE — ED Notes (Signed)
Patient transported to X-ray 

## 2015-05-02 NOTE — H&P (Signed)
CARDIOLOGY ADMISSION NOTE  Patient ID: Kristina MarvelBarbara R Richardson MRN: [id number] DOB/AGE: 79/11/1927 79 y.o.  Admit date: 05/02/2015 Primary Physician   Thane EduKOEHLER,ROBERT NICHOLAS, MD Primary Cardiologist   Dr. Antoine PocheHochrein Chief Complaint    Bradycardia  HPI:  The patient presents for evaluation of tachy brady.  She has a history of CAD with CABG.  I have not seen her since 2010.  She has had a reduced EF in the past.  However, when she was last seen here in 2013 for dizziness she had an EF on echo of 2016.  She was referred today from her primary care office because of heart rates in the 30s.. In the ED the heart rate was in the 120s.  She is noted to be in atrial flutter.  In the ED she did have an enlarged cardiac shadow on CXR.  Echo prelim does not demonstrate an effusion.  However, she does have an EF of 30% with global hypokinesis and moderate MR.    She presented with weakness.  She could not get out of the bathtub yesterday.  She has had increasing weakness over weeks and increasing lower extremity edema. She has had some palpitations and one episode of pre syncope a few weeks ago.  She reports that at her PCP office she had bradycardia.  She has uncontrolled HTN.  She has had some progressive DOE and possible PND.    Past Medical History  Diagnosis Date  . Hypertension   . Diabetes mellitus   . Asthma   . Hx of CABG   . Seizures (HCC)   . Coronary artery disease   . Stroke [facility](HCC)     TIA    . Shortness of breath   . Arthritis   . Bradycardia   . Hyperlipidemia   . MI (myocardial infarction) (HCC) 2009    Past Surgical History  Procedure Laterality Date  . Eye surgery    . Coronary angioplasty with stent placement      Coronary artery bypass graft; 03/18/2007 (Dr.Gerhardt LIMA to the LAD, SVG to     Allergies  Allergen Reactions  . Nsaids Other (See Comments)    REACTION: renal insufficiency   No current facility-administered medications on file prior to encounter.   Current  Outpatient Prescriptions on File Prior to Encounter  Medication Sig Dispense Refill  . losartan (COZAAR) 50 MG tablet Take 50 mg by mouth daily.    [name]. torsemide (DEMADEX) 20 MG tablet Take 20-40 mg by mouth 2 (two) times daily. 40 mg Morning and at 20 mg in the afternoon    . cyclobenzaprine (FLEXERIL) 10 MG tablet Take 1 tablet (10 mg total) by mouth 3 (three) times daily as needed for muscle spasms. (Patient not taking: Reported on 05/02/2015) 20 tablet 0   Social History   Social History  . Marital Status: Widowed    Spouse Name: N/A  . Number of Children: N/A  . Years of Education: N/A   Occupational History  . Not on file.   Social History Main Topics  . Smoking status: Former Games developermoker  . Smokeless tobacco: Never Used  . Alcohol Use: No  . Drug Use: No  . Sexual Activity: No   Other Topics Concern  . Not on file   Social History Narrative    Family History  Problem Relation Age of Onset  . Heart disease Father      ROS:    Light headed.  Otherwise as stated in the HPI  and negative for all other systems.  Physical Exam: Blood pressure 143/88, pulse 74, temperature 98.7 F (37.1 C), temperature source Oral, resp. rate 21, SpO2 93 %.  GENERAL:  Well appearing for her age HEENT:  Pupils equal round and reactive, fundi not visualized, oral mucosa unremarkable, dentures NECK:  No jugular venous distention, waveform within normal limits, carotid upstroke brisk and symmetric, no bruits, no thyromegaly LYMPHATICS:  No cervical, inguinal adenopathy LUNGS:  Clear to auscultation bilaterally BACK:  No CVA tenderness CHEST:  Unremarkable HEART:  PMI not displaced or sustained,S1 and S2 within normal limits, no S3, no clicks, no rubs, 3/6 systolic murmur radiating to the apex, holosystolic and no diastolic murmurs ABD:  Flat, positive bowel sounds normal in frequency in pitch, no bruits, no rebound, no guarding, no midline pulsatile mass, no hepatomegaly, no splenomegaly EXT:  2  plus pulses throughout, severe edema, no cyanosis no clubbing SKIN:  No rashes no nodules NEURO:  Cranial nerves II through XII grossly intact, motor grossly intact throughout PSYCH:  Cognitively intact, oriented to person place and time   Labs: Lab Results  Component Value Date   BUN 43* 05/02/2015   Lab Results  Component Value Date   CREATININE 1.86* 05/02/2015   Lab Results  Component Value Date   NA 145 05/02/2015   K 4.6 05/02/2015   CL 104 05/02/2015   CO2 30 05/02/2015    Lab Results  Component Value Date   WBC 3.4* 05/02/2015   HGB 12.0 05/02/2015   HCT 38.8 05/02/2015   MCV 100.0 05/02/2015   PLT 124* 05/02/2015     Radiology:  CXR:  1. Progressive enlargement of the cardiopericardial silhouette which now has an appearance most concerning for enlarging pericardial effusion. Correlation with echocardiography is strongly recommended. 2. In addition the cardiac enlargement there is cephalization of the pulmonary vasculature, and evidence of mild interstitial pulmonary edema and small bilateral pleural effusions; findings concerning for congestive heart failure. 3. Atherosclerosis.  EKG:  Atrial flutter, rate 97, RBBB  ASSESSMENT AND PLAN:    ATRIAL FLUTTER:  This is new.  With the tachybrady rate she will likely need TEE/DCCV this admit.  Will start DOAC.  She has had no active or recent bleeding issues and no significant contraindication.    [name] has a CHA2DS2 - VASc score of 9 with a risk of stroke of 15.2%.  HTN:   Start hydralazine.  Titrate as needed.    CARDIOMYOPATHY:  EF is lower than previous.  She is volume overloaded and will need IV diuresis.  Med titrate as much as we can.  I will hold off on beta blocker with her bradycardia.    CAD:  I am not suspecting any acute coronary events.  Not planning an ischemic event at this point.    HYPERLIPIDEMIA:    Will check a lipid profile.  CKD:  Follow creat closely with diuresis.     DM:   She has had good diabetes control per her report.  Check an A1C  CAROTID STENOSIS:   Follow up as an outpatient.   SignedRollene Rotunda 05/02/2015, 4:22 PM

## 2015-05-02 NOTE — Progress Notes (Signed)
*  PRELIMINARY RESULTS* Echocardiogram 2D Echocardiogram has been performed.  Jeryl Columbialliott, Asani Mcburney 05/02/2015, 4:37 PM

## 2015-05-03 DIAGNOSIS — I5021 Acute systolic (congestive) heart failure: Secondary | ICD-10-CM | POA: Diagnosis not present

## 2015-05-03 DIAGNOSIS — I483 Typical atrial flutter: Secondary | ICD-10-CM | POA: Diagnosis not present

## 2015-05-03 LAB — BASIC METABOLIC PANEL
ANION GAP: 11 (ref 5–15)
BUN: 44 mg/dL — ABNORMAL HIGH (ref 6–20)
CO2: 33 mmol/L — ABNORMAL HIGH (ref 22–32)
Calcium: 9.6 mg/dL (ref 8.9–10.3)
Chloride: 100 mmol/L — ABNORMAL LOW (ref 101–111)
Creatinine, Ser: 1.86 mg/dL — ABNORMAL HIGH (ref 0.44–1.00)
GFR, EST AFRICAN AMERICAN: 27 mL/min — AB (ref >60)
GFR, EST NON AFRICAN AMERICAN: 24 mL/min — AB (ref >60)
Glucose, Bld: 81 mg/dL (ref 65–99)
POTASSIUM: 4.8 mmol/L (ref 3.5–5.1)
SODIUM: 144 mmol/L (ref 135–145)

## 2015-05-03 LAB — CBC
HCT: 40.7 % (ref 36.0–46.0)
Hemoglobin: 12.3 g/dL (ref 12.0–15.0)
MCH: 30.3 pg (ref 26.0–34.0)
MCHC: 30.2 g/dL (ref 30.0–36.0)
MCV: 100.2 fL — AB (ref 78.0–100.0)
PLATELETS: 134 10*3/uL — AB (ref 150–400)
RBC: 4.06 MIL/uL (ref 3.87–5.11)
RDW: 13.7 % (ref 11.5–15.5)
WBC: 3.5 10*3/uL — ABNORMAL LOW (ref 4.0–10.5)

## 2015-05-03 LAB — LIPID PANEL
CHOL/HDL RATIO: 3.1 ratio
CHOLESTEROL: 155 mg/dL (ref 0–200)
HDL: 50 mg/dL (ref >40)
LDL Cholesterol: 92 mg/dL (ref 0–99)
TRIGLYCERIDES: 63 mg/dL (ref <150)
VLDL: 13 mg/dL (ref 0–40)

## 2015-05-03 MED ORDER — APIXABAN 2.5 MG PO TABS
2.5000 mg | ORAL_TABLET | Freq: Two times a day (BID) | ORAL | Status: DC
  Administered 2015-05-03 – 2015-05-15 (×25): 2.5 mg via ORAL
  Filled 2015-05-03 (×25): qty 1

## 2015-05-03 MED ORDER — FUROSEMIDE 10 MG/ML IJ SOLN
40.0000 mg | Freq: Two times a day (BID) | INTRAMUSCULAR | Status: DC
  Administered 2015-05-03 (×2): 40 mg via INTRAMUSCULAR
  Filled 2015-05-03 (×2): qty 4

## 2015-05-03 MED ORDER — FUROSEMIDE 10 MG/ML IJ SOLN
40.0000 mg | Freq: Two times a day (BID) | INTRAMUSCULAR | Status: DC
  Administered 2015-05-04 (×2): 40 mg via INTRAVENOUS
  Filled 2015-05-03 (×2): qty 4

## 2015-05-03 NOTE — Progress Notes (Signed)
81 year old female- admitted from home where she currently lives alone. She is a current participant in the [facility]. CSW spoke to FreelandEmily- SW- [facility] who indicated that one of patient's daughters live next door; her other daughter [place] in [place] and they are both very supportive. [facility] and is currently managing well at home. Plan is for return home at d/c. [facility] SW and RN will continue to monitor while in the hospital.  No CSW needs identified. CSW discussed with RNCM and will sign off. Lorri Frederickonna T. Jaci LazierCrowder, KentuckyLCSW [phone]

## 2015-05-03 NOTE — Progress Notes (Addendum)
SUBJECTIVE:  No distress.  No SOB.  Weak.   Still with significant edema    PHYSICAL EXAM Filed Vitals:   05/02/15 2333 05/03/15 0350 05/03/15 0605 05/03/15 0641  BP: 114/79 153/96    Pulse: 77 110    Temp: 98.4 F (36.9 C) 97.5 F (36.4 C)    TempSrc: Oral Oral    Resp: 18 18    Height:      Weight:    205 lb 3.2 oz (93.078 kg)  SpO2: 92% 100% 95%    General:  No distress Lungs:  Basilar crackles Heart:  Irregular Abdomen:  Positive bowel sounds, no rebound no guarding Extremities:  Moderate/severe edema Neuro:  Nonfocal  LABS:  Results for orders placed or performed during the hospital encounter of 05/02/15 (from the past 24 hour(s))  Brain natriuretic peptide     Status: Abnormal   Collection Time: 05/02/15  2:00 PM  Result Value Ref Range   B Natriuretic Peptide 618.3 (H) 0.0 - 100.0 pg/mL  CBC with Differential     Status: Abnormal   Collection Time: 05/02/15  2:00 PM  Result Value Ref Range   WBC 3.4 (L) 4.0 - 10.5 K/uL   RBC 3.88 3.87 - 5.11 MIL/uL   Hemoglobin 12.0 12.0 - 15.0 g/dL   HCT 44.038.8 34.736.0 - 42.546.0 %   MCV 100.0 78.0 - 100.0 fL   MCH 30.9 26.0 - 34.0 pg   MCHC 30.9 30.0 - 36.0 g/dL   RDW 95.613.7 38.711.5 - 56.415.5 %   Platelets 124 (L) 150 - 400 K/uL   Neutrophils Relative % 64 %   Neutro Abs 2.2 1.7 - 7.7 K/uL   Lymphocytes Relative 24 %   Lymphs Abs 0.8 0.7 - 4.0 K/uL   Monocytes Relative 11 %   Monocytes Absolute 0.4 0.1 - 1.0 K/uL   Eosinophils Relative 1 %   Eosinophils Absolute 0.0 0.0 - 0.7 K/uL   Basophils Relative 0 %   Basophils Absolute 0.0 0.0 - 0.1 K/uL  Basic metabolic panel     Status: Abnormal   Collection Time: 05/02/15  2:00 PM  Result Value Ref Range   Sodium 145 135 - 145 mmol/L   Potassium 4.6 3.5 - 5.1 mmol/L   Chloride 104 101 - 111 mmol/L   CO2 30 22 - 32 mmol/L   Glucose, Bld 94 65 - 99 mg/dL   BUN 43 (H) 6 - 20 mg/dL   Creatinine, Ser 3.321.86 (H) 0.44 - 1.00 mg/dL   Calcium 9.3 8.9 - 95.110.3 mg/dL   GFR calc non Af Amer 24  (L) >60 mL/min   GFR calc Af Amer 27 (L) >60 mL/min   Anion gap 11 5 - 15  Magnesium     Status: None   Collection Time: 05/02/15  2:00 PM  Result Value Ref Range   Magnesium 1.9 1.7 - 2.4 mg/dL  I-Stat Troponin, ED - 0, 3, 6 hours (not at [facility])     Status: None   Collection Time: 05/02/15  2:06 PM  Result Value Ref Range   Troponin i, poc 0.05 0.00 - 0.08 ng/mL   Comment 3          Basic metabolic panel     Status: Abnormal   Collection Time: 05/03/15  5:45 AM  Result Value Ref Range   Sodium 144 135 - 145 mmol/L   Potassium 4.8 3.5 - 5.1 mmol/L   Chloride 100 (L) 101 - 111 mmol/L  CO2 33 (H) 22 - 32 mmol/L   Glucose, Bld 81 65 - 99 mg/dL   BUN 44 (H) 6 - 20 mg/dL   Creatinine, Ser 0.86 (H) 0.44 - 1.00 mg/dL   Calcium 9.6 8.9 - 57.8 mg/dL   GFR calc non Af Amer 24 (L) >60 mL/min   GFR calc Af Amer 27 (L) >60 mL/min   Anion gap 11 5 - 15  Lipid panel     Status: None   Collection Time: 05/03/15  5:45 AM  Result Value Ref Range   Cholesterol 155 0 - 200 mg/dL   Triglycerides 63 <469 mg/dL   HDL 50 >62 mg/dL   Total CHOL/HDL Ratio 3.1 RATIO   VLDL 13 0 - 40 mg/dL   LDL Cholesterol 92 0 - 99 mg/dL  CBC     Status: Abnormal   Collection Time: 05/03/15  5:45 AM  Result Value Ref Range   WBC 3.5 (L) 4.0 - 10.5 K/uL   RBC 4.06 3.87 - 5.11 MIL/uL   Hemoglobin 12.3 12.0 - 15.0 g/dL   HCT 95.2 84.1 - 32.4 %   MCV 100.2 (H) 78.0 - 100.0 fL   MCH 30.3 26.0 - 34.0 pg   MCHC 30.2 30.0 - 36.0 g/dL   RDW 40.1 02.7 - 25.3 %   Platelets 134 (L) 150 - 400 K/uL    Intake/Output Summary (Last 24 hours) at 05/03/15 0942 Last data filed at 05/03/15 [id number]  Gross per 24 hour  Intake    660 ml  Output   1300 ml  Net   -640 ml   TELEMETRY:    Atrial flutter with variable conduction.  No sustained pauses.    ASSESSMENT AND PLAN:  ATRIAL FLUTTER:  Rate is controlled.  I will continue current therapy.  If it looks like she is having tachy brady I will plan TEE/DCCV before discharge.  If  rate is OK will plan elective cardioversion in 3 weeks or so.  I will start Eliquis.    CARDIOMYOPATHY:  EF 35% with moderate MR.  Medical management.  Significant volume.  I will give her IV Lasix today and dose daily based on the creatinine.    CAD:  No evidence of active ischemia.   CKD:  Creat is stable.  Follow with serial BMETs.    DM:  Ordered A1C.  Patient has been diet controlled.    HTN:  BP is better with addition of hydralazine.     Fayrene F[facility] 05/03/2015 9:42 AM

## 2015-05-04 DIAGNOSIS — I5021 Acute systolic (congestive) heart failure: Secondary | ICD-10-CM | POA: Diagnosis not present

## 2015-05-04 DIAGNOSIS — I483 Typical atrial flutter: Secondary | ICD-10-CM | POA: Diagnosis not present

## 2015-05-04 LAB — BASIC METABOLIC PANEL
ANION GAP: 10 (ref 5–15)
BUN: 46 mg/dL — ABNORMAL HIGH (ref 6–20)
CHLORIDE: 102 mmol/L (ref 101–111)
CO2: 33 mmol/L — AB (ref 22–32)
Calcium: 8.9 mg/dL (ref 8.9–10.3)
Creatinine, Ser: 1.66 mg/dL — ABNORMAL HIGH (ref 0.44–1.00)
GFR calc Af Amer: 31 mL/min — ABNORMAL LOW (ref >60)
GFR, EST NON AFRICAN AMERICAN: 27 mL/min — AB (ref >60)
GLUCOSE: 89 mg/dL (ref 65–99)
POTASSIUM: 4.1 mmol/L (ref 3.5–5.1)
Sodium: 145 mmol/L (ref 135–145)

## 2015-05-04 MED ORDER — GUAIFENESIN-DM 100-10 MG/5ML PO SYRP
5.0000 mL | ORAL_SOLUTION | ORAL | Status: DC | PRN
  Administered 2015-05-04 – 2015-05-14 (×11): 5 mL via ORAL
  Filled 2015-05-04 (×11): qty 5

## 2015-05-04 MED ORDER — CARVEDILOL 3.125 MG PO TABS
3.1250 mg | ORAL_TABLET | Freq: Two times a day (BID) | ORAL | Status: DC
  Administered 2015-05-04 – 2015-05-07 (×6): 3.125 mg via ORAL
  Filled 2015-05-04 (×6): qty 1

## 2015-05-04 NOTE — Progress Notes (Signed)
    SUBJECTIVE:  No distress.  No SOB.  Weak.   Still with significant edema.  Run of wide complex tachycardia noted.    PHYSICAL EXAM Filed Vitals:   05/03/15 2339 05/04/15 0515 05/04/15 0518 05/04/15 0907  BP: 123/80 125/66 126/55   Pulse: 77 118    Temp: 97.5 F (36.4 C) 97.8 F (36.6 C) 97.8 F (36.6 C)   TempSrc: Oral Oral Oral   Resp: 16 18    Height:      Weight:  204 lb 9.4 oz (92.8 kg)    SpO2: 97% 96% 96% 99%   General:  No distress Lungs:  Basilar crackles Heart:  Irregular Abdomen:  Positive bowel sounds, no rebound no guarding Extremities:  Moderate/severe edema   LABS:  Results for orders placed or performed during the hospital encounter of 05/02/15 (from the past 24 hour(s))  Basic metabolic panel     Status: Abnormal   Collection Time: 05/04/15  3:29 AM  Result Value Ref Range   Sodium 145 135 - 145 mmol/L   Potassium 4.1 3.5 - 5.1 mmol/L   Chloride 102 101 - 111 mmol/L   CO2 33 (H) 22 - 32 mmol/L   Glucose, Bld 89 65 - 99 mg/dL   BUN 46 (H) 6 - 20 mg/dL   Creatinine, Ser 8.111.66 (H) 0.44 - 1.00 mg/dL   Calcium 8.9 8.9 - 91.410.3 mg/dL   GFR calc non Af Amer 27 (L) >60 mL/min   GFR calc Af Amer 31 (L) >60 mL/min   Anion gap 10 5 - 15    Intake/Output Summary (Last 24 hours) at 05/04/15 1035 Last data filed at 05/04/15 0735  Gross per 24 hour  Intake    582 ml  Output   2501 ml  Net  -1919 ml   TELEMETRY:    Atrial flutter with variable conduction.  No sustained pauses.    ASSESSMENT AND PLAN:  ATRIAL FLUTTER:  Rate is slightly elevated and wide complex non sustained beats noted. .  I will continue current therapy.  If it looks like she is having tachy brady I will plan TEE/DCCV before discharge.   Eliquis was started.  I will start a low dose of Coreg.   CARDIOMYOPATHY:  EF 35% with moderate MR.  Medical management.  Significant volume.  I will continue her IV Lasix today and dose daily based on the creatinine.    CAD:  No evidence of active  ischemia.   CKD:  Creat is stable actually down today.  Follow with serial BMETs.    DM:  Ordered A1C.  Patient has been diet controlled.    HTN:  BP is better with addition of hydralazine.  Continue current therapy.    Fayrene FearingJames [facility] 05/04/2015 10:35 AM

## 2015-05-04 NOTE — Progress Notes (Signed)
The patient had an 11 beat run of V-Tach.  She was asleep at the time and did not report any symptoms upon waking.  VSS and charted.  Dr. Terressa KoyanagiBalfour was notified.

## 2015-05-05 DIAGNOSIS — I5021 Acute systolic (congestive) heart failure: Secondary | ICD-10-CM | POA: Diagnosis not present

## 2015-05-05 DIAGNOSIS — I6529 Occlusion and stenosis of unspecified carotid artery: Secondary | ICD-10-CM | POA: Diagnosis present

## 2015-05-05 DIAGNOSIS — J45909 Unspecified asthma, uncomplicated: Secondary | ICD-10-CM | POA: Diagnosis present

## 2015-05-05 DIAGNOSIS — I130 Hypertensive heart and chronic kidney disease with heart failure and stage 1 through stage 4 chronic kidney disease, or unspecified chronic kidney disease: Secondary | ICD-10-CM | POA: Diagnosis present

## 2015-05-05 DIAGNOSIS — I2510 Atherosclerotic heart disease of native coronary artery without angina pectoris: Secondary | ICD-10-CM | POA: Diagnosis present

## 2015-05-05 DIAGNOSIS — I509 Heart failure, unspecified: Secondary | ICD-10-CM | POA: Diagnosis not present

## 2015-05-05 DIAGNOSIS — M1990 Unspecified osteoarthritis, unspecified site: Secondary | ICD-10-CM | POA: Diagnosis present

## 2015-05-05 DIAGNOSIS — Z951 Presence of aortocoronary bypass graft: Secondary | ICD-10-CM | POA: Diagnosis not present

## 2015-05-05 DIAGNOSIS — I495 Sick sinus syndrome: Secondary | ICD-10-CM | POA: Diagnosis present

## 2015-05-05 DIAGNOSIS — M7989 Other specified soft tissue disorders: Secondary | ICD-10-CM | POA: Diagnosis not present

## 2015-05-05 DIAGNOSIS — Z79899 Other long term (current) drug therapy: Secondary | ICD-10-CM | POA: Diagnosis not present

## 2015-05-05 DIAGNOSIS — I252 Old myocardial infarction: Secondary | ICD-10-CM | POA: Diagnosis not present

## 2015-05-05 DIAGNOSIS — M25572 Pain in left ankle and joints of left foot: Secondary | ICD-10-CM | POA: Diagnosis not present

## 2015-05-05 DIAGNOSIS — N184 Chronic kidney disease, stage 4 (severe): Secondary | ICD-10-CM | POA: Diagnosis present

## 2015-05-05 DIAGNOSIS — M109 Gout, unspecified: Secondary | ICD-10-CM | POA: Diagnosis present

## 2015-05-05 DIAGNOSIS — I5023 Acute on chronic systolic (congestive) heart failure: Secondary | ICD-10-CM | POA: Diagnosis present

## 2015-05-05 DIAGNOSIS — R0600 Dyspnea, unspecified: Secondary | ICD-10-CM | POA: Diagnosis not present

## 2015-05-05 DIAGNOSIS — I340 Nonrheumatic mitral (valve) insufficiency: Secondary | ICD-10-CM | POA: Diagnosis present

## 2015-05-05 DIAGNOSIS — R001 Bradycardia, unspecified: Secondary | ICD-10-CM | POA: Diagnosis present

## 2015-05-05 DIAGNOSIS — E1122 Type 2 diabetes mellitus with diabetic chronic kidney disease: Secondary | ICD-10-CM | POA: Diagnosis present

## 2015-05-05 DIAGNOSIS — Z955 Presence of coronary angioplasty implant and graft: Secondary | ICD-10-CM | POA: Diagnosis not present

## 2015-05-05 DIAGNOSIS — E785 Hyperlipidemia, unspecified: Secondary | ICD-10-CM | POA: Diagnosis present

## 2015-05-05 DIAGNOSIS — I4892 Unspecified atrial flutter: Secondary | ICD-10-CM | POA: Diagnosis not present

## 2015-05-05 DIAGNOSIS — J449 Chronic obstructive pulmonary disease, unspecified: Secondary | ICD-10-CM | POA: Diagnosis present

## 2015-05-05 DIAGNOSIS — R609 Edema, unspecified: Secondary | ICD-10-CM | POA: Diagnosis not present

## 2015-05-05 DIAGNOSIS — Z87891 Personal history of nicotine dependence: Secondary | ICD-10-CM | POA: Diagnosis not present

## 2015-05-05 DIAGNOSIS — I429 Cardiomyopathy, unspecified: Secondary | ICD-10-CM | POA: Diagnosis present

## 2015-05-05 DIAGNOSIS — I483 Typical atrial flutter: Secondary | ICD-10-CM | POA: Diagnosis not present

## 2015-05-05 DIAGNOSIS — Z8673 Personal history of transient ischemic attack (TIA), and cerebral infarction without residual deficits: Secondary | ICD-10-CM | POA: Diagnosis not present

## 2015-05-05 DIAGNOSIS — R062 Wheezing: Secondary | ICD-10-CM | POA: Diagnosis not present

## 2015-05-05 DIAGNOSIS — R0689 Other abnormalities of breathing: Secondary | ICD-10-CM | POA: Diagnosis not present

## 2015-05-05 MED ORDER — FUROSEMIDE 10 MG/ML IJ SOLN
80.0000 mg | Freq: Two times a day (BID) | INTRAMUSCULAR | Status: DC
  Administered 2015-05-05 – 2015-05-09 (×9): 80 mg via INTRAVENOUS
  Filled 2015-05-05 (×9): qty 8

## 2015-05-05 NOTE — Progress Notes (Signed)
    SUBJECTIVE:  No distress.  No SOB.  Weak.   Still with significant edema.  Run of wide complex tachycardia noted.    PHYSICAL EXAM Filed Vitals:   05/04/15 1429 05/04/15 1528 05/04/15 1959 05/05/15 0500  BP:  164/90 154/88 149/90  Pulse:   110 109  Temp:   97.5 F (36.4 C) 97.7 F (36.5 C)  TempSrc:   Oral Oral  Resp:   18 18  Height:      Weight:    205 lb 0.4 oz (93 kg)  SpO2: 99%  98% 98%   General:  No distress Lungs:  Basilar crackles Heart:  Irregular Abdomen:  Positive bowel sounds, no rebound no guarding Extremities:  Moderate/severe edema   LABS:  No results found for this or any previous visit (from the past 24 hour(s)).  Intake/Output Summary (Last 24 hours) at 05/05/15 0910 Last data filed at 05/05/15 0800  Gross per 24 hour  Intake    680 ml  Output   1675 ml  Net   -995 ml   TELEMETRY:    Atrial flutter with variable conduction.  No sustained pauses.    ASSESSMENT AND PLAN:  ATRIAL FLUTTER:  Rate is slightly elevated and wide complex non sustained beats noted. .  I will continue current therapy.  If it looks like she is having tachy brady I will plan TEE/DCCV before discharge.   Eliquis was started.  I staredt a low dose of Coreg.  I will consider titrating this tomorrow.    CARDIOMYOPATHY:  EF 35% with moderate MR.  Medical management.  Significant volume.  I will increase her IV Lasix today.  CAD:  No evidence of active ischemia.   CKD:  Creat is stable actually down today.  Follow with serial BMETs.    DM:  A1C 5.2.    HTN:  BP is better with addition of hydralazine.  Still slightly elevated.  Continue current therapy.    Fayrene FearingJames Xzandria Clevinger 05/05/2015 9:10 AM

## 2015-05-06 ENCOUNTER — Other Ambulatory Visit (HOSPITAL_COMMUNITY): Payer: No Typology Code available for payment source

## 2015-05-06 ENCOUNTER — Inpatient Hospital Stay (HOSPITAL_COMMUNITY): Payer: Medicare (Managed Care)

## 2015-05-06 LAB — GLUCOSE, CAPILLARY: GLUCOSE-CAPILLARY: 121 mg/dL — AB (ref 65–99)

## 2015-05-06 NOTE — Progress Notes (Signed)
    SUBJECTIVE:  She is wheezing this morning and apparently she does this when she is moving about.  She reports that she is light headed with standing.  No acute distress or pain  Still with significant edema.     PHYSICAL EXAM Filed Vitals:   05/05/15 0500 05/05/15 0818 05/05/15 1937 05/06/15 0523  BP: 149/90 140/80 125/72 114/64  Pulse: 109 106 111 82  Temp: 97.7 F (36.5 C)  98.2 F (36.8 C) 97.8 F (36.6 C)  TempSrc: Oral  Oral Oral  Resp: 18  22 20   Height:      Weight: 205 lb 0.4 oz (93 kg)   196 lb 12.8 oz (89.268 kg)  SpO2: 98% 98% 95% 96%   General:  No distress Lungs:  Basilar crackles, diffuse wheezing Heart:  Irregular Abdomen:  Positive bowel sounds, no rebound no guarding Extremities:  Moderate edema   LABS:  No results found for this or any previous visit (from the past 24 hour(s)).  Intake/Output Summary (Last 24 hours) at 05/06/15 0600 Last data filed at 05/06/15 0200  Gross per 24 hour  Intake   1100 ml  Output   2750 ml  Net  -1650 ml   TELEMETRY:    Atrial flutter with variable conduction.  No sustained pauses.  05/06/2015   ASSESSMENT AND PLAN:  ATRIAL FLUTTER:  Rate is controlled .  Eliquis was started.  On low dose of Coreg.  Continue current therapy.   CARDIOMYOPATHY:  EF 35% with moderate MR.  Medical management.  Lasix increased yesterday and good UO.   Continue current dose.  I will order a CXR given increased wheezing and decreased breath sounds.  No other evidence of pneumonia (no fevers).   CAD:  No evidence of active ischemia.   CKD:  Creat is reduced.  Follow with serial BMETs.    DM:  A1C 5.2.    HTN:  BP is controlled.  Continue current therapy.    Fayrene FearingJames Rickelle Sylvestre 05/06/2015 6:00 AM

## 2015-05-07 ENCOUNTER — Other Ambulatory Visit (HOSPITAL_COMMUNITY): Payer: No Typology Code available for payment source

## 2015-05-07 LAB — BASIC METABOLIC PANEL
ANION GAP: 7 (ref 5–15)
BUN: 53 mg/dL — ABNORMAL HIGH (ref 6–20)
CHLORIDE: 99 mmol/L — AB (ref 101–111)
CO2: 35 mmol/L — AB (ref 22–32)
Calcium: 8.5 mg/dL — ABNORMAL LOW (ref 8.9–10.3)
Creatinine, Ser: 1.76 mg/dL — ABNORMAL HIGH (ref 0.44–1.00)
GFR calc Af Amer: 29 mL/min — ABNORMAL LOW (ref >60)
GFR, EST NON AFRICAN AMERICAN: 25 mL/min — AB (ref >60)
GLUCOSE: 129 mg/dL — AB (ref 65–99)
POTASSIUM: 4 mmol/L (ref 3.5–5.1)
Sodium: 141 mmol/L (ref 135–145)

## 2015-05-07 MED ORDER — CARVEDILOL 6.25 MG PO TABS
6.2500 mg | ORAL_TABLET | Freq: Two times a day (BID) | ORAL | Status: DC
  Administered 2015-05-07: 6.25 mg via ORAL
  Filled 2015-05-07: qty 1

## 2015-05-07 MED ORDER — CARVEDILOL 6.25 MG PO TABS: 6.2500 mg | ORAL_TABLET | Freq: Two times a day (BID) | ORAL | Status: DC

## 2015-05-07 NOTE — Evaluation (Signed)
Physical Therapy Evaluation Patient Details Name: Kristina MarvelBarbara R Richardson MRN: [id number] DOB: 01/01/1928 Today's Date: 05/07/2015   History of Present Illness  82 yo female with onset of CHF and pleural effusion, admitted for tachy brady syndrom was referred to PT for evaluation of gait.   Pt has history of cardiomyopathy, CHF, CABG, CAD  Clinical Impression  Pt [facility] and could benefit from RW but does have O2 to manage at home.  Recommended her to SNF but she is against the idea.  Her plan is in flux, and will hope she is willing to accept the help to avoid a higher fall risk with her pets     Follow Up Recommendations SNF    Equipment Recommendations  None recommended by PT (await SNF disposition)    Recommendations for Other Services Rehab consult     Precautions / Restrictions Precautions Precautions: Fall (telemetry) Restrictions Weight Bearing Restrictions: No      Mobility  Bed Mobility Overal bed mobility: Needs Assistance Bed Mobility: Supine to Sit     Supine to sit: Mod assist;HOB elevated     General bed mobility comments: pt needed complete cuing to sequence  Transfers Overall transfer level: Needs assistance Equipment used: Rolling walker (2 wheeled);2 person hand held assist Transfers: Sit to/from [employer] CorporationStand;Stand Pivot Transfers Sit to Stand: Mod assist;Max assist Stand pivot transfers: Mod assist       General transfer comment: nearly max assist to power up and mod for safe transition to chair  Ambulation/Gait             General Gait Details: unable to do more than transfer to chair  Stairs            Wheelchair Mobility    Modified Rankin (Stroke Patients Only)       Balance Overall balance assessment: Needs assistance Sitting-balance support: Feet supported Sitting balance-Leahy Scale: Fair   Postural control: Posterior lean Standing balance support: Bilateral upper extremity supported Standing  balance-Leahy Scale: Poor                               Pertinent Vitals/Pain Pain Assessment: No/denies pain    Home Living Family/patient expects to be discharged to:: Private residence Living Arrangements: Alone Available Help at Discharge: Family;Available PRN/intermittently Type of Home: House Home Access: Ramped entrance     Home Layout: One level Home Equipment: None;Other (comment) (states she holds onto the furniture)      Prior Function Level of Independence: Independent (but has held onto furniture)               Hand Dominance        Extremity/Trunk Assessment                         Communication   Communication: No difficulties  Cognition Arousal/Alertness: Awake/alert Behavior During Therapy: WFL for tasks assessed/performed Overall Cognitive Status: No family/caregiver present to determine baseline cognitive functioning       Memory: Decreased short-term memory              General Comments General comments (skin integrity, edema, etc.): Pt has some motivation to get home and does not want to get to SNF but did talk with her about her physical limitations to get home and be there alone    Exercises        Assessment/Plan  PT Assessment Patient needs continued PT services  PT Diagnosis Difficulty walking   PT Problem List Decreased strength;Decreased range of motion;Decreased activity tolerance;Decreased balance;Decreased mobility;Decreased cognition;Decreased coordination;Decreased knowledge of use of DME;Decreased safety awareness;Decreased knowledge of precautions;Cardiopulmonary status limiting activity;Obesity  PT Treatment Interventions DME instruction;Gait training;Functional mobility training;Therapeutic activities;Therapeutic exercise;Balance training;Neuromuscular re-education;Cognitive remediation;Patient/family education   PT Goals (Current goals can be found in the Care Plan section) Acute Rehab PT  Goals Patient Stated Goal: to get up to BR and walk PT Goal Formulation: With patient Time For Goal Achievement: 05/21/15 Potential to Achieve Goals: Good    Frequency Min 2X/week   Barriers to discharge Inaccessible home environment;Decreased caregiver support has no help and will be weak to go up ramp    Co-evaluation               End of Session Equipment Utilized During Treatment: Gait belt Activity Tolerance: Patient tolerated treatment well;Patient limited by fatigue;Patient limited by lethargy Patient left: in chair;with call bell/phone within reach;with chair alarm set;Other (comment) (elevation of legs as MD requested) Nurse Communication: Mobility status         Time: 1610-9604 PT Time Calculation (min) (ACUTE ONLY): 32 min   Charges:   PT Evaluation $PT Eval Moderate Complexity: 1 Procedure PT Treatments $Therapeutic Activity: 8-22 mins   PT G Codes:        Ivar Drape 05-22-15, 5:13 PM   Samul Dada, PT MS Acute Rehab Dept. Number: [facility] [id number] and [facility] [phone]

## 2015-05-07 NOTE — Progress Notes (Signed)
    SUBJECTIVE:  She is breathing better than previous but still not at baseline.   PHYSICAL EXAM Filed Vitals:   05/06/15 1703 05/06/15 1709 05/06/15 2015 05/07/15 0545  BP: 183/98 177/89 136/75 135/79  Pulse: 110 99 111 113  Temp: 98.1 F (36.7 C)  97.8 F (36.6 C) 98.5 F (36.9 C)  TempSrc: Oral  Oral Oral  Resp: 20  21 20   Height:      Weight:    195 lb 8 oz (88.678 kg)  SpO2: 94%  93% 99%   General:  No distress Lungs:  Basilar crackles, no wheezing Heart:  Irregular Abdomen:  Positive bowel sounds, no rebound no guarding Extremities:  Moderate edema   LABS:  Results for orders placed or performed during the hospital encounter of 05/02/15 (from the past 24 hour(s))  Glucose, capillary     Status: Abnormal   Collection Time: 05/06/15  9:14 PM  Result Value Ref Range   Glucose-Capillary 121 (H) 65 - 99 mg/dL   Comment 1 Notify RN    Comment 2 Document in Chart   Basic metabolic panel     Status: Abnormal   Collection Time: 05/07/15  4:46 AM  Result Value Ref Range   Sodium 141 135 - 145 mmol/L   Potassium 4.0 3.5 - 5.1 mmol/L   Chloride 99 (L) 101 - 111 mmol/L   CO2 35 (H) 22 - 32 mmol/L   Glucose, Bld 129 (H) 65 - 99 mg/dL   BUN 53 (H) 6 - 20 mg/dL   Creatinine, Ser 1.611.76 (H) 0.44 - 1.00 mg/dL   Calcium 8.5 (L) 8.9 - 10.3 mg/dL   GFR calc non Af Amer 25 (L) >60 mL/min   GFR calc Af Amer 29 (L) >60 mL/min   Anion gap 7 5 - 15    Intake/Output Summary (Last 24 hours) at 05/07/15 0751 Last data filed at 05/07/15 0552  Gross per 24 hour  Intake   1143 ml  Output   1350 ml  Net   -207 ml   TELEMETRY:    Atrial flutter with variable conduction.  No sustained pauses.  05/07/2015  CXR:  Stable moderate cardiac enlargement. Mild pulmonary venous hypertensive changes remain without evidence of overt airspace edema. On the lateral view there is suggestion of very small bilateral posterior pleural effusions. Scattered scarring and atelectasis present. No focal  airspace consolidation.  ASSESSMENT AND PLAN:  ATRIAL FLUTTER:  Rate is controlled .  Eliquis was started.  On low dose of Coreg.  Rate is still up and I don't see any sustained brady rates.  I will increase the Coreg to 6.25 mg bid.   DYSPNEA:   No change in exam.  Continue IV diuresis tonight  CARDIOMYOPATHY:  EF 35% with moderate MR.  Medical management.  Her weight is down about 10 lbs since admission.    CAD:  No evidence of active ischemia.   CKD:  Creat is stable.  Follow with serial BMETs.    DM:  A1C 5.2.    HTN:  BP is controlled.  Continue current therapy.    Fayrene FearingJames [facility] 05/07/2015 7:51 AM

## 2015-05-08 LAB — GLUCOSE, CAPILLARY: Glucose-Capillary: 171 mg/dL — ABNORMAL HIGH (ref 65–99)

## 2015-05-08 MED ORDER — BISOPROLOL FUMARATE 5 MG PO TABS
5.0000 mg | ORAL_TABLET | Freq: Every day | ORAL | Status: DC
  Administered 2015-05-08 – 2015-05-14 (×7): 5 mg via ORAL
  Filled 2015-05-08 (×7): qty 1

## 2015-05-08 NOTE — Clinical Documentation Improvement (Signed)
  Hospitalist  Would you please further clarify documentation of CKD to determine the stage and whether acute kidney failure is present?   Acute Renal Failure/Acute Kidney Injury  Acute Tubular Necrosis  Acute Renal Cortical Necrosis  Acute Renal Medullary Necrosis  Acute on Chronic Renal Failure  Chronic Renal Failure      Stage 1      Stage 2      Stage 3      Stage 4      ESRD  Other  Clinically Undetermined  Document any associated diagnoses/conditions.   Supporting Information: On admission BUN was 43; creatinin was 1.86; GFR was 27. On 05/07/15 BUN was 53; creatinine was 1.76 and GFR was 29.   Please exercise your independent, professional judgment when responding. A specific answer is not anticipated or expected.   Thank [name],  [name] [name] Information Management [place] [phone]

## 2015-05-08 NOTE — NC FL2 (Signed)
Real MEDICAID FL2 LEVEL OF CARE SCREENING TOOL     IDENTIFICATION  Patient Name: [name] Birthdate: 1928/05/03 Sex: female Admission Date (Current Location): 05/02/2015  [facility] and [place] Number:  Producer, television/film/video and Address:  [facility], [address], [place], [place] [address]      Provider Number: [id number]  Attending Physician Name and Address:  [name], MD  Relative Name and Phone Number:  Elnita Maxwell daughter, [phone]    Current Level of Care: Hospital Recommended Level of Care: Skilled Nursing Facility Prior Approval Number:    Date Approved/Denied:   PASRR Number: [id number]  Discharge Plan: SNF    Current Diagnoses: Patient Active Problem List   Diagnosis Date Noted  . Atrial flutter (HCC) 05/05/2015  . Acute systolic heart failure (HCC) 05/02/2015  . HTN (hypertension) 08/26/2011  . HYPERTENSION, BENIGN ESSENTIAL 03/20/2010  . OSTEOPOROSIS 05/15/2009  . GAIT IMBALANCE 04/02/2009  . DIASTOLIC HEART FAILURE, CHRONIC 03/04/2009  . FATIGUE / MALAISE 03/04/2009  . DIVERTICULAR BLEEDING, HX OF 10/11/2008  . PURE HYPERCHOLESTEROLEMIA 09/12/2008  . CAROTID ARTERY STENOSIS 06/05/2008  . CHRONIC COMB SYSTOLIC&DIASTOLIC HEART FAILURE 03/25/2008  . LOW BACK PAIN SYNDROME 03/12/2008  . CORONARY ARTERY DISEASE 04/29/2007  . DIABETES MELLITUS, TYPE II 12/21/2006  . DYSLIPIDEMIA 12/21/2006  . OBESITY NOS 12/21/2006  . ANEMIA, NORMOCYTIC, CHRONIC 12/21/2006  . MYOCARDIAL INFARCTION 12/21/2006  . ASTHMA 12/21/2006  . DIVERTICULOSIS, COLON 12/21/2006  . RENAL INSUFFICIENCY, CHRONIC 12/21/2006  . DEPENDENT EDEMA, LEGS, BILATERAL 12/21/2006  . CARDIAC CATHETERIZATION, LEFT, HX OF 08/27/2006  . LOSS, SENSORY HEARING, BILATERAL 01/04/2003    Orientation RESPIRATION BLADDER Height & Weight    Self, Time, Situation, Place  Normal Incontinent 5\' 1"  (154.9 cm) 187 lbs.  BEHAVIORAL SYMPTOMS/MOOD NEUROLOGICAL  BOWEL NUTRITION STATUS   (N/A)  (N/A) Continent  (Please see dc summary)  AMBULATORY STATUS COMMUNICATION OF NEEDS Skin   Extensive Assist Verbally Normal                       Personal Care Assistance Level of Assistance  Bathing, Feeding, Dressing Bathing Assistance: Maximum assistance Feeding assistance: Limited assistance Dressing Assistance: Maximum assistance     Functional Limitations Info  Hearing   Hearing Info: Impaired      SPECIAL CARE FACTORS FREQUENCY  PT (By licensed PT)     PT Frequency: Min 2x/week              Contractures      Additional Factors Info  Code Status, Allergies Code Status Info: Full Allergies Info: Nsaids           Current Medications (05/08/2015):  This is the current hospital active medication list Current Facility-Administered Medications  Medication Dose Route Frequency Provider Last Rate Last Dose  . 0.9 %  sodium chloride infusion  250 mL Intravenous PRN Amber Caryl Bis, NP      . acetaminophen (TYLENOL) tablet 650 mg  650 mg Oral Q6H PRN Marily Lente, NP      . albuterol (PROVENTIL) (2.5 MG/3ML) 0.083% nebulizer solution 2.5 mg  2.5 mg Nebulization Q4H PRN [name], MD   2.5 mg at 05/06/15 0651  . apixaban (ELIQUIS) tablet 2.5 mg  2.5 mg Oral BID [name], MD   2.5 mg at 05/08/15 1001  . atorvastatin (LIPITOR) tablet 20 mg  20 mg Oral q1800 Marily Lente, NP   20 mg at 05/07/15 1711  .  bisoprolol (ZEBETA) tablet 5 mg  5 mg Oral Daily Rollene RotundaJames Hochrein, MD   5 mg at 05/08/15 1001  . cholecalciferol (VITAMIN D) tablet 1,000 Units  1,000 Units Oral Daily Marily LenteAmber K Seiler, NP   1,000 Units at 05/08/15 1002  . furosemide (LASIX) injection 80 mg  80 mg Intravenous BID Rollene RotundaJames Hochrein, MD   80 mg at 05/08/15 1001  . gabapentin (NEURONTIN) capsule 300 mg  300 mg Oral QHS Marily LenteAmber K Seiler, NP   300 mg at 05/07/15 2159  . guaiFENesin-dextromethorphan (ROBITUSSIN DM) 100-10 MG/5ML syrup 5 mL  5 mL Oral Q4H PRN Rollene RotundaJames Hochrein,  MD   5 mL at 05/07/15 2159  . hydrALAZINE (APRESOLINE) tablet 25 mg  25 mg Oral 3 times per day Marily LenteAmber K Seiler, NP   25 mg at 05/08/15 1454  . hydroxypropyl methylcellulose / hypromellose (ISOPTO TEARS / GONIOVISC) 2.5 % ophthalmic solution 1 drop  1 drop Both Eyes QID Marily LenteAmber K Seiler, NP   1 drop at 05/07/15 1711  . losartan (COZAAR) tablet 50 mg  50 mg Oral Daily Marily LenteAmber K Seiler, NP   50 mg at 05/08/15 1002  . nitroGLYCERIN (NITROSTAT) SL tablet 0.4 mg  0.4 mg Sublingual Q5 Min x 3 PRN Amber Caryl BisK Seiler, NP      . ondansetron (ZOFRAN) injection 4 mg  4 mg Intravenous Q6H PRN Amber Caryl BisK Seiler, NP      . sodium chloride 0.9 % injection 3 mL  3 mL Intravenous Q12H Amber Caryl BisK Seiler, NP   3 mL at 05/08/15 1009  . sodium chloride 0.9 % injection 3 mL  3 mL Intravenous PRN Marily LenteAmber K Seiler, NP   3 mL at 05/04/15 1656     Discharge Medications: Please see discharge summary for a list of discharge medications.  Relevant Imaging Results:  Relevant Lab Results:   Additional Information SSN: [id number]  Mearl LatinNadia S Ryman Rathgeber, LCSWA

## 2015-05-08 NOTE — Progress Notes (Signed)
    SUBJECTIVE:  She is breathing better than previous but still not at baseline.  Very weak and dizzy when she gets up.     PHYSICAL EXAM Filed Vitals:   05/07/15 0900 05/07/15 1711 05/07/15 2203 05/08/15 0326  BP: 130/80 130/80 150/82 143/72  Pulse:  110 98 109  Temp:   98.9 F (37.2 C) 98.4 F (36.9 C)  TempSrc:   Oral Oral  Resp:   20 19  Height:      Weight:    187 lb 2.7 oz (84.9 kg)  SpO2:   92% 94%   General:  No distress Lungs:  Basilar crackles, positive upper airway wheezing Heart:  Irregular Abdomen:  Positive bowel sounds, no rebound no guarding Extremities:  Moderate edema   LABS:  No results found for this or any previous visit (from the past 24 hour(s)).  Intake/Output Summary (Last 24 hours) at 05/08/15 0834 Last data filed at 05/08/15 0319  Gross per 24 hour  Intake    360 ml  Output   1600 ml  Net  -1240 ml   TELEMETRY:    Atrial flutter with variable conduction.  No sustained pauses.  05/08/2015   ASSESSMENT AND PLAN:  ATRIAL FLUTTER:    Eliquis was started.  On low dose of Coreg.   I increased the Coreg to 6.25 mg bid.  .  With wheezing I am going to change to Zebeta.   DYSPNEA:   No change in exam.   I am going to continue IV therapy today.    CARDIOMYOPATHY:  EF 35% with moderate MR.  Medical management.  Her weight is down about 18 lbs since admission.   Continued edema.  I am going to apply compression stockings.   CAD:  No evidence of active ischemia.   CKD:  Creat is up slightly .  Follow with serial BMETs.    DM:  A1C 5.2.    HTN:  BP is controlled.  Continue current therapy.   DISPOSITION:  She is at least amenable to short term NHP with rehab.  I talked to the nurse to initiate this.  I think she would be able to go in the next one to two days.     Fayrene FearingJames [facility] 05/08/2015 8:34 AM

## 2015-05-08 NOTE — Clinical Documentation Improvement (Signed)
Hospitalist  Can the diagnosis of CHF be further specified?    Acuity - Acute, Chronic, Acute on Chronic   Type - Systolic, Diastolic, Systolic and Diastolic  Other  Clinically Undetermined   Document any associated diagnoses/conditions   Supporting Information: ECHO results are available and documented as CHF without acuity or type noted.   Please exercise your independent, professional judgment when responding. A specific answer is not anticipated or expected.   Thank [name],  Delyla Sandeen L [facility] Information Management [place] [phone]

## 2015-05-08 NOTE — Care Management Important Message (Signed)
Important Message  Patient Details  Name: Kristina MarvelBarbara R Couse MRN: [id number] Date of Birth: 08/07/1927   Medicare Important Message Given:  Yes    Missi Mcmackin P Oluchi Pucci 05/08/2015, 1:12 PM

## 2015-05-08 NOTE — Clinical Social Work Note (Signed)
Clinical Social Work Assessment  Patient Details  Name: SYDNI ELIZARRARAZ MRN: [id number] Date of Birth: 1927/06/16  Date of referral:  05/08/15               Reason for consult:  Facility Placement                Permission sought to share information with:  Facility Sport and exercise psychologist, Family Supports Permission granted to share information::  Yes, Verbal Permission Granted  Name::     [facility] (that participate in the [facility] Program)-- [facility] and [facility]      Housing/Transportation Living arrangements for the past 2 months:  [place] of Information:  Patient Patient Interpreter Needed:  None Criminal Activity/Legal Involvement Pertinent to Current Situation/Hospitalization:  No - Comment as needed Significant Relationships:  Adult Children (Daughter Edwin Cap) Lives with:  Self Do you feel safe going back to the place where you live?  Yes (Now agrees to SNF Placement short term rehab.) Need for family participation in patient care:  No ([place]) ([place] for daughter to be involved)  Care giving concerns:  "I have some heart failure and it's made me pretty weak; I live by myself."   Social Worker assessment / plan:  CSW met with patient this afternoon after being notified that she has changed her mind about seeking short term SNF which is recommended by PT.  She lives alone; one of her daughters live next door and assists her as needed.  Her primary care daughter lives in [place].  Patient receives PACE services and CSW noted that PACE SW- Raquel Sarna was in patient's room this afternoon.  She is aware of patent's agreement to SNF placement. PACE facilities are [facility]..  Patient prefers [facility] if possible.  Fl2 will be initiated and will sent to both facilities for review.    Employment status:  Retired Forensic scientist:  Other (Comment Required) (PACE/Medicare) PT Recommendations:  [place] / Referral to  community resources:  [place]  Patient/Family's Response to care:  Patient states that she is feeling better but no longer feels she can manage safely at home at this time.  She agrees to short term SNF.    Patient/Family's Understanding of and Emotional Response to Diagnosis, Current Treatment, and Prognosis:  Patient appears to have a limited understanding of her current medical situation, diagnosis and prognosis.  She states that she want too get better and will "do what is necessary" to get to feeling better.  Daughter not available at this time to interview.     Emotional Assessment Appearance:  Appears stated age Attitude/Demeanor/Rapport:   (Friendly, responsive, cooperative, open to SNF suggestion, rational) Affect (typically observed):  Happy, Calm, Pleasant Orientation:  Oriented to Self, Oriented to Place, Oriented to  Time, Oriented to Situation Alcohol / Substance use:  Tobacco Use (Former smoker many years ago) Psych involvement (Current and /or in the community):   No  Discharge Needs  Concerns to be addressed:    Yes Readmission within the last 30 days:  No Current discharge risk:  Dependent with Mobility Barriers to Discharge:  Continued Medical Work up   Kendell Bane T, LCSW 05/08/2015, 1:00 PM

## 2015-05-08 NOTE — Clinical Social Work Placement (Signed)
   CLINICAL SOCIAL WORK PLACEMENT  NOTE  Date:  05/08/2015  Patient Details  Name: Kristina MarvelBarbara R Richardson MRN: [id number] Date of Birth: 03/08/1928  Clinical Social Work is seeking post-discharge placement for this patient at the Skilled  Nursing Facility level of care (*CSW will initial, date and re-position this form in  chart as items are completed):  No   Patient/family provided with [facility] Clinical Social Work Department's list of facilities offering this level of care within the geographic area requested by the patient (or if unable, by the patient's family).  No   Patient/family informed of their freedom to choose among providers that offer the needed level of care, that participate in Medicare, Medicaid or managed care program needed by the patient, have an available bed and are willing to accept the patient.  No   Patient/family informed of [facility]'s ownership interest in [facility] and [facility], as well as of the fact that they are under no obligation to receive care at these facilities.  PASRR submitted to EDS on 05/08/15     PASRR number received on 05/08/15     Existing PASRR number confirmed on       FL2 transmitted to all facilities in geographic area requested by pt/family on 05/08/15     FL2 transmitted to all facilities within larger geographic area on       Patient informed that his/her managed care company has contracts with or will negotiate with certain facilities, including the following:   ([facility])         Patient/family informed of bed offers received.  Patient chooses bed at       Physician recommends and patient chooses bed at      Patient to be transferred to   on  .  Patient to be transferred to facility by       Patient family notified on   of transfer.  Name of family member notified:        PHYSICIAN Please prepare priority discharge summary, including medications, Please sign FL2, Please prepare prescriptions      Additional Comment:    _______________________________________________ Darylene Pricerowder, Boyde Grieco T, LCSW 05/08/2015, 5:50 PM

## 2015-05-09 ENCOUNTER — Inpatient Hospital Stay (HOSPITAL_COMMUNITY): Payer: Medicare (Managed Care)

## 2015-05-09 ENCOUNTER — Encounter (HOSPITAL_COMMUNITY): Payer: Self-pay | Admitting: Pulmonary Disease

## 2015-05-09 DIAGNOSIS — R0600 Dyspnea, unspecified: Secondary | ICD-10-CM

## 2015-05-09 DIAGNOSIS — R062 Wheezing: Secondary | ICD-10-CM

## 2015-05-09 DIAGNOSIS — M25572 Pain in left ankle and joints of left foot: Secondary | ICD-10-CM

## 2015-05-09 DIAGNOSIS — R0689 Other abnormalities of breathing: Secondary | ICD-10-CM

## 2015-05-09 LAB — BASIC METABOLIC PANEL
ANION GAP: 9 (ref 5–15)
BUN: 52 mg/dL — AB (ref 6–20)
CHLORIDE: 95 mmol/L — AB (ref 101–111)
CO2: 37 mmol/L — AB (ref 22–32)
Calcium: 8.9 mg/dL (ref 8.9–10.3)
Creatinine, Ser: 1.58 mg/dL — ABNORMAL HIGH (ref 0.44–1.00)
GFR calc Af Amer: 33 mL/min — ABNORMAL LOW (ref >60)
GFR, EST NON AFRICAN AMERICAN: 29 mL/min — AB (ref >60)
GLUCOSE: 122 mg/dL — AB (ref 65–99)
POTASSIUM: 4 mmol/L (ref 3.5–5.1)
Sodium: 141 mmol/L (ref 135–145)

## 2015-05-09 LAB — URIC ACID: Uric Acid, Serum: 8.5 mg/dL — ABNORMAL HIGH (ref 2.3–6.6)

## 2015-05-09 MED ORDER — BUDESONIDE 0.25 MG/2ML IN SUSP
0.2500 mg | Freq: Two times a day (BID) | RESPIRATORY_TRACT | Status: DC
  Administered 2015-05-09 – 2015-05-15 (×12): 0.25 mg via RESPIRATORY_TRACT
  Filled 2015-05-09 (×12): qty 2

## 2015-05-09 MED ORDER — IPRATROPIUM-ALBUTEROL 0.5-2.5 (3) MG/3ML IN SOLN
3.0000 mL | Freq: Four times a day (QID) | RESPIRATORY_TRACT | Status: DC
  Administered 2015-05-09 – 2015-05-11 (×9): 3 mL via RESPIRATORY_TRACT
  Filled 2015-05-09 (×9): qty 3

## 2015-05-09 MED ORDER — FUROSEMIDE 10 MG/ML IJ SOLN
80.0000 mg | Freq: Three times a day (TID) | INTRAMUSCULAR | Status: DC
  Administered 2015-05-09 – 2015-05-10 (×3): 80 mg via INTRAVENOUS
  Filled 2015-05-09 (×3): qty 8

## 2015-05-09 NOTE — Progress Notes (Signed)
Talked to [name] Worker at [facility] -( [facility] is a Program of All-inclusive Care for the Elderly (PACE) that provides community-based services to enrolled individuals who need medical care and support to continue living at home). Kristina Burtonmily stated that she is approved to go to [facility] at discharge. [name] [facility] Peterson Memorial HospitalRN,MHA,BSN [phone]

## 2015-05-09 NOTE — Progress Notes (Addendum)
    SUBJECTIVE:  She is breathing better than previous but still not at baseline.  Her biggest complaint today is left ankle pain.     PHYSICAL EXAM Filed Vitals:   05/08/15 1013 05/08/15 1300 05/08/15 2050 05/09/15 0339  BP: 143/77 129/79 146/66 137/81  Pulse:  105 80 107  Temp:  97.3 F (36.3 C) 99.6 F (37.6 C) 98 F (36.7 C)  TempSrc:  Oral Oral Oral  Resp:  18 18 20   Height:      Weight:    190 lb 3.2 oz (86.274 kg)  SpO2:  93% 95% 94%   General:  No distress Lungs:  Basilar crackles, positive u wheezing Heart:  Irregular Abdomen:  Positive bowel sounds, no rebound no guarding Extremities:  Moderate edema Neuro:  Nonfocal   LABS:  Results for orders placed or performed during the hospital encounter of 05/02/15 (from the past 24 hour(s))  Glucose, capillary     Status: Abnormal   Collection Time: 05/08/15  8:49 PM  Result Value Ref Range   Glucose-Capillary 171 (H) 65 - 99 mg/dL   Comment 1 Notify RN    Comment 2 Document in Chart   Basic metabolic panel     Status: Abnormal   Collection Time: 05/09/15  3:14 AM  Result Value Ref Range   Sodium 141 135 - 145 mmol/L   Potassium 4.0 3.5 - 5.1 mmol/L   Chloride 95 (L) 101 - 111 mmol/L   CO2 37 (H) 22 - 32 mmol/L   Glucose, Bld 122 (H) 65 - 99 mg/dL   BUN 52 (H) 6 - 20 mg/dL   Creatinine, Ser 1.611.58 (H) 0.44 - 1.00 mg/dL   Calcium 8.9 8.9 - 09.610.3 mg/dL   GFR calc non Af Amer 29 (L) >60 mL/min   GFR calc Af Amer 33 (L) >60 mL/min   Anion gap 9 5 - 15    Intake/Output Summary (Last 24 hours) at 05/09/15 1120 Last data filed at 05/09/15 0953  Gross per 24 hour  Intake    883 ml  Output   1052 ml  Net   -169 ml   TELEMETRY:    Atrial flutter with variable conduction.  No sustained pauses.  05/09/2015   ASSESSMENT AND PLAN:  ATRIAL FLUTTER:    Eliquis was started.    With wheezing I changed to Zebeta.   DYSPNEA:   No change in exam.   I am going to continue IV therapy today and increase.  I would like to have  pulmonary see her because of her continued dyspnea with wheezing despite diuresis.    CARDIOMYOPATHY:  EF 35% with moderate MR.  Medical management.  Her weight is down about 16 lbs since admission.   Continued edema.  She could not wear compression stockings.  I will increase Lasix today.   ANKLE PAIN:  Unclear etiology.  I will get XRay and a uric acid level.    CAD:  No evidence of active ischemia.   CKD:  Creat is up stable.   Follow   DM:  A1C 5.2.    HTN:  BP is controlled.  Continue current therapy.   DISPOSITION:  She is at least amenable to short term NHP with rehab.  Staff is working on this.    Fayrene FearingJames Bergen Regional Medical Centerochrein 05/09/2015 11:20 AM

## 2015-05-09 NOTE — Research (Signed)
[email or web address]@Discharge Study Informed Consent   Subject Name: [name]  Subject met inclusion and exclusion criteria.  The informed consent form, study requirements and expectations were reviewed with the subject and questions and concerns were addressed prior to the signing of the consent form.  The subject verbalized understanding of the trail requirements.  The subject agreed to participate in the [email or web address]@Discharge trial and signed the informed consent.  The informed consent was obtained prior to performance of any protocol-specific procedures for the subject.  A copy of the signed informed consent was given to the subject and a copy was placed in the subject's medical record.  [name] 05/09/2015, 15:15  

## 2015-05-09 NOTE — Progress Notes (Signed)
Pt first choice of [facility] is able to accept pt when medically stable.  CSW will continue to follow  Merlyn LotJenna Holoman, Memorial Hospital AssociationCSWA Clinical Social Worker [phone]

## 2015-05-09 NOTE — Progress Notes (Signed)
Pt refused to wear TEDS stated makes her uncomfortable. TEDS removed as per pt request after education

## 2015-05-09 NOTE — Consult Note (Signed)
Name: Kristina MarvelBarbara R Richardson MRN: [id number] DOB: 07/03/1927    ADMISSION DATE:  05/02/2015 CONSULTATION DATE:  05/08/2014  REFERRING MD :  Dr. Antoine PocheHochrein  CHIEF COMPLAINT:  Dyspnea, wheeze  BRIEF PATIENT DESCRIPTION: 82 year old female admitted 12/29 with dyspnea, tachy/brady, and new onset atrial flutter and worsening cardiomyopathy. Dyspnea somewhat improved with diuresis and rate control, however, dyspnea and wheeze persist. Pulmonary consulted.  SIGNIFICANT EVENTS  12/29 - admit  STUDIES:    HISTORY OF PRESENT ILLNESS:  82 year old female, current everyday smoker with PMH as below, which includes CAD s/p CABG 2008, known systolic CHF,  Asthma, CVA, and DM. She has a several week history of weakness, lower extremity edema,  and fatigue. Had one fall secondary to what sounds like a syncopal episode. More recently she developed progressive dyspnea and presented to her primary care office 12/29 where her heart rate was found to be in the 30s. She was sent to the emergency department where she was then found to be in new onset atrial flutter with rates in the 120s. Initial echocardiogram showed LVEF 30% with global hypokinesis and moderate MR, which was worse than her prior known cardiomyopathy.  She was admitted to cardiology. She was felt to be volume overloaded on exam and CXR and was treated with diuretics. Aflutter has been successfully treated thus far with beta blockers for rate control. She was started on Eliquis due to CHADsVAsc score 9.  Dyspnea has been slow to resolve and as of 1/5 her breathing has not returned to baseline. Wheeze noted on auscultation as well. Pulmonary was consulted for further evaluation.   Currently she states that her breathing is not much improved from the time of admit. She reports cough and chest congestion, but unable to clear secretions. When she can get some up they are white in color. Denies fevers, but endorses chills. Denies sinus congestion and post-nasal  drips. Denies GERD-like symptoms. She does snore.   PAST MEDICAL HISTORY :   has a past medical history of Hypertension; Asthma; Stroke [facility](HCC); Arthritis; Bradycardia; Hyperlipidemia; MI (myocardial infarction) (HCC) (2009); NSTEMI (non-ST elevated myocardial infarction) (HCC) (08/27/2006); Type II diabetes mellitus (HCC); CHF (congestive heart failure) (HCC); Chronic renal insufficiency; and Coronary artery disease.  has past surgical history that includes Eye surgery; Coronary artery bypass graft (03/18/2007); Cataract extraction w/ intraocular lens implant (Left, 10/17/2007); Coronary angioplasty with stent (03/18/2007); Cardiac catheterization (12/23/2006); and Cardiac catheterization (03/16/2007). Prior to Admission medications   Medication Sig Start Date End Date Taking? Authorizing Provider  acetaminophen (TYLENOL) 325 MG tablet Take 650 mg by mouth every 6 (six) hours as needed for mild pain, moderate pain or headache.   Yes Historical Provider, MD  aspirin EC 81 MG tablet Take 81 mg by mouth daily.   Yes Historical Provider, MD  cholecalciferol (VITAMIN D) 1000 units tablet Take 1,000 Units by mouth daily.   Yes Historical Provider, MD  gabapentin (NEURONTIN) 300 MG capsule Take 300 mg by mouth at bedtime.   Yes Historical Provider, MD  hydroxypropyl methylcellulose / hypromellose (ISOPTO TEARS / GONIOVISC) 2.5 % ophthalmic solution Place 1 drop into both eyes 4 (four) times daily.   Yes Historical Provider, MD  losartan (COZAAR) 50 MG tablet Take 50 mg by mouth daily.   Yes Historical Provider, MD  Menthol, Topical Analgesic, (BIOFREEZE) 4 % GEL Apply 1 application topically 2 (two) times daily as needed (pain).   Yes Historical Provider, MD  PROVENTIL HFA 108 [phone](90 Base) MCG/ACT  inhaler Inhale 2 puffs into the lungs every 4 (four) hours as needed for wheezing or shortness of breath.  04/08/15  Yes Historical Provider, MD  Skin Protectants, Misc. (EUCERIN) cream Apply 1 application topically 2  (two) times daily.   Yes Historical Provider, MD  torsemide (DEMADEX) 20 MG tablet Take 20-40 mg by mouth 2 (two) times daily. 40 mg Morning and at 20 mg in the afternoon   Yes Historical Provider, MD  cyclobenzaprine (FLEXERIL) 10 MG tablet Take 1 tablet (10 mg total) by mouth 3 (three) times daily as needed for muscle spasms. Patient not taking: Reported on 05/02/2015 11/28/12   Geoffery Lyons, MD   Allergies  Allergen Reactions  . Nsaids Other (See Comments)    REACTION: renal insufficiency    FAMILY HISTORY:  family history includes Heart disease in her father. SOCIAL HISTORY:  reports that she has quit smoking. She has never used smokeless tobacco. She reports that she does not drink alcohol or use illicit drugs.  REVIEW OF SYSTEMS:   Bolds are positive  Constitutional: weight loss, gain, night sweats, Fevers, chills, fatigue.  HEENT: headaches, Sore throat, sneezing, nasal congestion, post nasal drip, Difficulty swallowing, Tooth/dental problems, visual complaints visual changes, ear ache. CV:  chest pain, radiates: ,Orthopnea, PND, swelling in lower extremities, dizziness, palpitations, syncope.  GI  heartburn, indigestion, abdominal pain, nausea, vomiting, diarrhea, change in bowel habits, loss of appetite, bloody stools.  Resp: cough, minimally productive: , hemoptysis, dyspnea, chest pain, pleuritic.  Skin: rash or itching or icterus. GU: dysuria, change in color of urine, urgency or frequency. flank pain, hematuria.  MS: joint pain or swelling. decreased range of motion. Psych: change in mood or affect. depression or anxiety.  Neuro: difficulty with speech, weakness, numbness, ataxia.   SUBJECTIVE:   VITAL SIGNS: Temp:  [98 F (36.7 C)-99.6 F (37.6 C)] 98 F (36.7 C) (01/05 1147) Pulse Rate:  [80-107] 98 (01/05 1147) Resp:  [18-20] 18 (01/05 1147) BP: (96-146)/(59-81) 96/59 mmHg (01/05 1147) SpO2:  [94 %-96 %] 96 % (01/05 1147) Weight:  [86.274 kg (190 lb 3.2 oz)]  86.274 kg (190 lb 3.2 oz) (01/05 0339)  PHYSICAL EXAMINATION: General:  Elderly female in NAD Neuro:  Alert, oriented, non-focal HEENT:  [place]/AT, PERRL, no MRG Cardiovascular: IRIR, rate controlled. +1 LE edema Lungs:  Fine crackles bibasilar. Inspiratory/expiratory wheeze transmitted from upper airway. Abdomen:  Soft, non-tender, non-distended Musculoskeletal:  No acute deformity Skin:  Grossly intact   Recent Labs Lab 05/04/15 0329 05/07/15 0446 05/09/15 0314  NA 145 141 141  K 4.1 4.0 4.0  CL 102 99* 95*  CO2 33* 35* 37*  BUN 46* 53* 52*  CREATININE 1.66* 1.76* 1.58*  GLUCOSE 89 129* 122*    Recent Labs Lab 05/02/15 1400 05/03/15 0545  HGB 12.0 12.3  HCT 38.8 40.7  WBC 3.4* 3.5*  PLT 124* 134*   No results found.  ASSESSMENT / PLAN:  Dyspnea - O2 saturations fine on RA. COPD on differential with smoking history. However, wheeze seems to be coming more-so from upper airway, which would be more consistent with a vocal cord abnormality. No recent hospitalizations or intubations that would cause injury here though. Suspect CHF playing a more significant role in dyspnea. Upper airway wheeze  - Repeat CXR - Attempt full PFT, although question how much she will be able to participate with this - May need ENT evaluation to assess vocal cords in setting upper airway wheeze.  - IS and Flutter valve  as tolerated - Diuresis per cardiology, increasing dose today. - Will follow  Joneen Roach, AGACNP-BC [facility] Pulmonology/Critical Care Pager [phone] or [phone]  05/09/2015 2:24 PM

## 2015-05-10 ENCOUNTER — Inpatient Hospital Stay (HOSPITAL_COMMUNITY): Payer: Medicare (Managed Care)

## 2015-05-10 ENCOUNTER — Telehealth: Payer: Self-pay | Admitting: Cardiology

## 2015-05-10 ENCOUNTER — Encounter (HOSPITAL_COMMUNITY): Payer: No Typology Code available for payment source

## 2015-05-10 DIAGNOSIS — R609 Edema, unspecified: Secondary | ICD-10-CM

## 2015-05-10 DIAGNOSIS — R062 Wheezing: Secondary | ICD-10-CM | POA: Insufficient documentation

## 2015-05-10 LAB — BASIC METABOLIC PANEL
ANION GAP: 10 (ref 5–15)
BUN: 63 mg/dL — ABNORMAL HIGH (ref 6–20)
CALCIUM: 8.9 mg/dL (ref 8.9–10.3)
CO2: 34 mmol/L — ABNORMAL HIGH (ref 22–32)
CREATININE: 1.83 mg/dL — AB (ref 0.44–1.00)
Chloride: 97 mmol/L — ABNORMAL LOW (ref 101–111)
GFR calc Af Amer: 28 mL/min — ABNORMAL LOW (ref >60)
GFR, EST NON AFRICAN AMERICAN: 24 mL/min — AB (ref >60)
GLUCOSE: 228 mg/dL — AB (ref 65–99)
Potassium: 4.4 mmol/L (ref 3.5–5.1)
Sodium: 141 mmol/L (ref 135–145)

## 2015-05-10 MED ORDER — COLCHICINE 0.6 MG PO TABS
0.6000 mg | ORAL_TABLET | Freq: Two times a day (BID) | ORAL | Status: DC
  Administered 2015-05-10 – 2015-05-11 (×4): 0.6 mg via ORAL
  Filled 2015-05-10 (×5): qty 1

## 2015-05-10 MED ORDER — FUROSEMIDE 80 MG PO TABS
80.0000 mg | ORAL_TABLET | Freq: Every day | ORAL | Status: DC
  Administered 2015-05-10 – 2015-05-11 (×2): 80 mg via ORAL
  Filled 2015-05-10 (×2): qty 1

## 2015-05-10 MED ORDER — HYDRALAZINE HCL 10 MG PO TABS
10.0000 mg | ORAL_TABLET | Freq: Three times a day (TID) | ORAL | Status: DC
  Administered 2015-05-10 – 2015-05-15 (×14): 10 mg via ORAL
  Filled 2015-05-10 (×15): qty 1

## 2015-05-10 NOTE — Progress Notes (Signed)
PULMONARY / CRITICAL CARE MEDICINE   Name: Salvatore MarvelBarbara R Donson MRN: [id number] DOB: 01/18/1928    ADMISSION DATE:  05/02/2015 CONSULTATION DATE: 05/09/2015  REFERRING MD:  Dr. Antoine PocheHochrein  CHIEF COMPLAINT: Wheezing  SUBJECTIVE:  Breathing better.  Feels sleepy.  VITAL SIGNS: BP 101/55 mmHg  Pulse 34  Temp(Src) 98.4 F (36.9 C) (Oral)  Resp 19  Ht 5\' 1"  (1.549 m)  Wt 193 lb 8 oz (87.771 kg)  BMI 36.58 kg/m2  SpO2 99%  INTAKE / OUTPUT: I/O last 3 completed shifts: In: 3173 [P.O.:3170; I.V.:3] Out: 700 [Urine:700]  PHYSICAL EXAMINATION: General:  pleasant Neuro:  Follows commands HEENT:  No stridor Cardiovascular:  Irregular, 2/6 SM Lungs:  No wheeze Abdomen:  Soft, non tender Musculoskeletal:  1+ edema Skin:  No rashes  LABS:  BMET  Recent Labs Lab 05/04/15 0329 05/07/15 0446 05/09/15 0314  NA 145 141 141  K 4.1 4.0 4.0  CL 102 99* 95*  CO2 33* 35* 37*  BUN 46* 53* 52*  CREATININE 1.66* 1.76* 1.58*  GLUCOSE 89 129* 122*    Electrolytes  Recent Labs Lab 05/04/15 0329 05/07/15 0446 05/09/15 0314  CALCIUM 8.9 8.5* 8.9    CBC No results for input(s): WBC, HGB, HCT, PLT in the last 168 hours.   Glucose  Recent Labs Lab 05/06/15 2114 05/08/15 2049  GLUCAP 121* 171*    Imaging Dg Ankle 2 Views Left  05/09/2015  CLINICAL DATA:  Left ankle pain.  No injury. EXAM: LEFT ANKLE - 2 VIEW COMPARISON:  None. FINDINGS: There is moderate generalized soft tissue swelling over the left ankle. Ankle mortise is within normal. There is no acute fracture or dislocation. There are mild degenerate changes over the midfoot and hindfoot. Small to moderate size inferior calcaneal spur present. Mild atherosclerotic disease is present. IMPRESSION: Moderate generalized soft tissue swelling. No acute bony abnormality. Electronically Signed   By: Elberta Fortisaniel  Boyle M.D.   On: 05/09/2015 14:26   Ct Chest High Resolution  05/10/2015  CLINICAL DATA:  82 year old female with history  of dyspnea. Evaluate for interstitial lung disease. EXAM: CT CHEST WITHOUT CONTRAST TECHNIQUE: Multidetector CT imaging of the chest was performed following the standard protocol without intravenous contrast. High resolution imaging of the lungs, as well as inspiratory and expiratory imaging, was performed. COMPARISON:  No priors. FINDINGS: Mediastinum/Lymph Nodes: Heart size is mildly enlarged. There is no significant pericardial fluid, thickening or pericardial calcification. There is atherosclerosis of the thoracic aorta, the great vessels of the mediastinum and the coronary arteries, including calcified atherosclerotic plaque in the left main, left anterior descending, left circumflex and right coronary arteries. Status post median sternotomy for CABG, including LIMA to the LAD. Mild dilatation of the pulmonic trunk (3.4 cm in diameter). No pathologically enlarged mediastinal or hilar lymph nodes. Please note that accurate exclusion of hilar adenopathy is limited on noncontrast CT scans. Esophagus is unremarkable in appearance. Lungs/Pleura: Small bilateral pleural effusions (right greater than left) with some associated passive atelectasis in the lower lobes of the lungs bilaterally. No acute consolidative airspace disease. No suspicious appearing pulmonary nodules or masses. High-resolution images demonstrate no significant regions of ground-glass attenuation, subpleural reticulation, parenchymal banding or frank honeycombing to suggest interstitial lung disease. There is minimal bronchiectasis and some thick walled cystic change in the medial aspects of the lower lobes of the lungs bilaterally, strongly favored to represent sequela of prior infection. Inspiratory and expiratory imaging is unremarkable. Very mild centrilobular and paraseptal emphysema. Upper abdomen: Unremarkable.  Musculoskeletal: There are no aggressive appearing lytic or blastic lesions noted in the visualized portions of the skeleton.  Median sternotomy wires. IMPRESSION: 1. No evidence of interstitial lung disease. 2. There are some areas of mild fibrotic changes in the medial aspects of the lower lobes of the lungs bilaterally with some associated cylindrical bronchiectasis, strongly favored to represent scarring at sites of of prior infection. 3. Mild diffuse bronchial wall thickening with mild centrilobular and paraseptal emphysema. 4. Small bilateral pleural effusions (right greater than left). 5. Mild cardiomegaly. 6. Dilatation of the pulmonic trunk (3.4 cm in diameter), suggesting pulmonary arterial hypertension. 7. Atherosclerosis, including left main and 3 vessel coronary artery disease. Status post median sternotomy for CABG, including LIMA to the LAD. Electronically Signed   By: Trudie Reed M.D.   On: 05/10/2015 08:18   Dg Chest Port 1 View  05/09/2015  CLINICAL DATA:  SOB, productive cough, and wheezing started last night. NO FEVER. ? Fluid overload. Hx HTN, acute on chronic CHF, A-Flutter, MI, CABG (2008), diabetes, and asthma. EXAM: PORTABLE CHEST 1 VIEW COMPARISON:  05/06/2015 FINDINGS: Cardiomediastinal silhouette is within normal limits. The lungs are clear. There are no focal consolidations or pleural effusions. No evidence for pulmonary edema. IMPRESSION: Cardiomegaly. Electronically Signed   By: Norva Pavlov M.D.   On: 05/09/2015 15:34     STUDIES:  12/29 Echo >> EF 30 to 35%, mod MR, mod LA dilation, mod RV systolic dysfx, mod TR 01/06 HRCT chest >> atherosclerosis, small b/l effusions Rt > Lt, atx, minimal BTX at bases b/l, mild centrilobular/paraseptal emphysema  CULTURES:  ANTIBIOTICS:  SIGNIFICANT EVENTS: 12/29 Admit  LINES/TUBES:  DISCUSSION: 82 yo female smoker admitted with dyspnea, tachy/brady and new onset a flutter with cardiomyopathy.  She had persistent wheezing and dyspnea, and PCCM consulted.  No evidence for ILD on CT chest.  ASSESSMENT / PLAN:  Wheezing with hx of smoking >>  she likely has underlying obstructive lung disease. Plan: - continue pulmicort, duoneb - defer PFT >> don't think she could effectively do test, and wouldn't change management at this time - smoking cessation  A flutter. Cardiomyopathy with valvular heart disease. Hx of CAD, HTN. Plan: - per primary team  PCCM will sign off.  Please call if additional help needed while she is in hospital.  Coralyn Helling, MD [facility] Pulmonary/Critical Care 05/10/2015, 11:29 AM Pager:  [phone] After 3pm call: [phone]

## 2015-05-10 NOTE — Progress Notes (Signed)
Physical Therapy Treatment Patient Details Name: [name] MRN: [id number] DOB: 1927/08/26 Today's Date: 05/10/2015    History of Present Illness 82 yo female with onset of CHF and pleural effusion, admitted for tachy brady syndrom was referred to PT for evaluation of gait.   Pt has history of cardiomyopathy, CHF, CABG, CAD    PT Comments    Pt was put back to bed per nsg report but DVT is cleared for her.  Will expect her to be able to get up with nursing to chair and continue to work on standign/gait.  SNF is still her plan for DC.  Follow Up Recommendations  SNF     Equipment Recommendations  None recommended by PT    Recommendations for Other Services Rehab consult     Precautions / Restrictions Precautions Precautions: Fall Precaution Comments: telemetry Restrictions Weight Bearing Restrictions: No Other Position/Activity Restrictions: cleared for DVT and L ankle fracture    Mobility  Bed Mobility Overal bed mobility: Needs Assistance Bed Mobility: Supine to Sit;Sit to Supine     Supine to sit: Mod assist Sit to supine: Mod assist   General bed mobility comments: complete help to get RLE back to bed  Transfers Overall transfer level: Needs assistance Equipment used: Rolling walker (2 wheeled);1 person hand held assist Transfers: Sit to/from Stand Sit to Stand: Mod assist         General transfer comment: mod assist to power up and pt then began to complain about LLE  Ambulation/Gait             General Gait Details: unable due to LLE pain   Stairs            Wheelchair Mobility    Modified Rankin (Stroke Patients Only)       Balance     Sitting balance-Leahy Scale: Fair       Standing balance-Leahy Scale: Poor                      Cognition Arousal/Alertness: Awake/alert Behavior During Therapy: WFL for tasks assessed/performed Overall Cognitive Status: Within Functional Limits for tasks assessed                      Exercises      General Comments General comments (skin integrity, edema, etc.): cleared LLE for vascular blockage and will need to work toward mobility with LLE to progress to gait      Pertinent Vitals/Pain Pain Assessment: Faces Faces Pain Scale: Hurts little more Pain Location: L knee/calf Pain Descriptors / Indicators: Aching;Cramping Pain Intervention(s): Monitored during session;Limited activity within patient's tolerance;Repositioned;Other (comment) (called nursing who thought pt had DVT but is cleared)    Home Living                      Prior Function            PT Goals (current goals can now be found in the care plan section) Acute Rehab PT Goals Patient Stated Goal: get OOB PT Goal Formulation: With patient Progress towards PT goals: Progressing toward goals    Frequency  Min 2X/week    PT Plan Current plan remains appropriate    Co-evaluation             End of Session Equipment Utilized During Treatment: Gait belt Activity Tolerance: Patient limited by pain Patient left: in bed;with call bell/phone within reach;with bed alarm set;Other (comment) (nursing  had thought DVT was present but then cleared)     Time: [id number] PT Time Calculation (min) (ACUTE ONLY): 29 min  Charges:  $Therapeutic Activity: 23-37 mins                    G Codes:      Ivar DrapeStout, Letitia Sabala E 05/10/2015, 2:18 PM   Samul Dadauth Ardie Dragoo, PT MS Acute Rehab Dept. Number: [facility] [id number] and [facility] [phone]

## 2015-05-10 NOTE — Progress Notes (Signed)
VASCULAR LAB PRELIMINARY  PRELIMINARY  PRELIMINARY  PRELIMINARY  Bilateral lower extremity venous duplex  completed.    Preliminary report:  Bilateral:  No evidence of DVT, superficial thrombosis, or Baker's Cyst.    Lamel Mccarley, RVT 05/10/2015, 10:21 AM

## 2015-05-10 NOTE — Clinical Social Work Note (Signed)
Clinical Social Worker continuing to follow patient and family for support and discharge planning needs.  Patient is affiliated with [facility] and plans to discharge to [facility] once medically stable.  Per MD note, patient to remain hospitalized through the weekend with plans to discharge next week - [facility].  CSW remains available for support and to facilitate patient discharge needs once medically stable.  Macario GoldsJesse Lodema [place], [place] [phone]

## 2015-05-10 NOTE — Progress Notes (Addendum)
    SUBJECTIVE:  She is breathing better than previous but still not at baseline.  Her biggest complaint today again is left ankle pain.     PHYSICAL EXAM Filed Vitals:   05/09/15 2009 05/09/15 2045 05/10/15 0100 05/10/15 0536  BP: 120/46   101/55  Pulse: 80   34  Temp: 98.8 F (37.1 C)   98.4 F (36.9 C)  TempSrc: Oral   Oral  Resp: 18   19  Height:      Weight:    193 lb 8 oz (87.771 kg)  SpO2: 93% 91% 90% 99%   General:  No distress Lungs:  Basilar crackles, positive  Wheezing diffusely Heart:  Irregular Abdomen:  Positive bowel sounds, no rebound no guarding Extremities:  Moderate edema Neuro:  Nonfocal   LABS:  Results for orders placed or performed during the hospital encounter of 05/02/15 (from the past 24 hour(s))  Uric acid     Status: Abnormal   Collection Time: 05/09/15  3:56 PM  Result Value Ref Range   Uric Acid, Serum 8.5 (H) 2.3 - 6.6 mg/dL    Intake/Output Summary (Last 24 hours) at 05/10/15 [id number] Last data filed at 05/10/15 0649  Gross per 24 hour  Intake   2993 ml  Output    550 ml  Net   2443 ml   TELEMETRY:    Atrial flutter with variable conduction.  No sustained pauses.  05/10/2015  ANKLE FILMS:   There is moderate generalized soft tissue swelling over the left ankle. Ankle mortise is within normal. There is no acute fracture or dislocation. There are mild degenerate changes over the midfoot and hindfoot. Small to moderate size inferior calcaneal spur present. Mild atherosclerotic disease is present.  ASSESSMENT AND PLAN:  ATRIAL FLUTTER:    Eliquis was started.    Rate OK.    DYSPNEA:   Pulmonary evaluation appreciated.  CT unrevealing.  Further plans for chronic and acute pulmonary meds per CCM.  Still with decreased air movement and wheezing  CARDIOMYOPATHY:  EF 35% with moderate MR.  Medical management.  Her weight has been going up the last two days.  She took in 3 liters of fluid yesterday!  Fluid restrict and continue current  diuresis.  Her edema is much improved  ANKLE PAIN:  Unclear etiology.   Soft tissue swelling over the ankle.  No fracture.  Uric acid level is increased.  I will treat for possible gout.  This is actually a limiting factor as she cannot ambulate at all.    CAD:  No evidence of active ischemia.   CKD:  Creat is pending.   Follow   DM:  A1C 5.2.    HTN:  BP is trending down.  I will reduce hydralazine.    DISPOSITION:  She is at least amenable to short term NHP with rehab.  Staff is working on this.  I suspect she will need to stay over the weekend.    Fayrene FearingJames Weiland Tomich 05/10/2015 8:22 AM   Addendum:  BUN and creat increased I will switch to PO Lasix.

## 2015-05-10 NOTE — Telephone Encounter (Signed)
Error

## 2015-05-11 DIAGNOSIS — I4892 Unspecified atrial flutter: Secondary | ICD-10-CM

## 2015-05-11 LAB — BASIC METABOLIC PANEL
ANION GAP: 8 (ref 5–15)
BUN: 67 mg/dL — ABNORMAL HIGH (ref 6–20)
CHLORIDE: 99 mmol/L — AB (ref 101–111)
CO2: 33 mmol/L — ABNORMAL HIGH (ref 22–32)
Calcium: 8.6 mg/dL — ABNORMAL LOW (ref 8.9–10.3)
Creatinine, Ser: 1.78 mg/dL — ABNORMAL HIGH (ref 0.44–1.00)
GFR calc Af Amer: 29 mL/min — ABNORMAL LOW (ref >60)
GFR, EST NON AFRICAN AMERICAN: 25 mL/min — AB (ref >60)
GLUCOSE: 126 mg/dL — AB (ref 65–99)
POTASSIUM: 4.7 mmol/L (ref 3.5–5.1)
Sodium: 140 mmol/L (ref 135–145)

## 2015-05-11 MED ORDER — TORSEMIDE 20 MG PO TABS
40.0000 mg | ORAL_TABLET | Freq: Every day | ORAL | Status: DC
  Administered 2015-05-11 – 2015-05-15 (×5): 40 mg via ORAL
  Filled 2015-05-11 (×5): qty 2

## 2015-05-11 MED ORDER — TORSEMIDE 20 MG PO TABS
20.0000 mg | ORAL_TABLET | Freq: Every evening | ORAL | Status: DC
  Administered 2015-05-11 – 2015-05-14 (×4): 20 mg via ORAL
  Filled 2015-05-11 (×4): qty 1

## 2015-05-11 MED ORDER — IPRATROPIUM-ALBUTEROL 0.5-2.5 (3) MG/3ML IN SOLN
3.0000 mL | Freq: Three times a day (TID) | RESPIRATORY_TRACT | Status: DC
  Administered 2015-05-12 – 2015-05-15 (×10): 3 mL via RESPIRATORY_TRACT
  Filled 2015-05-11 (×11): qty 3

## 2015-05-11 NOTE — Progress Notes (Signed)
SUBJECTIVE:  She is breathing better, sleepy    Evaluated initially for tachy brady by PCP. In ED HR 120 originally. New onset atrial flutter. CABG   PHYSICAL EXAM Filed Vitals:   05/10/15 2027 05/11/15 0240 05/11/15 0418 05/11/15 0747  BP: 111/52  135/76   Pulse: 109  109   Temp: 98.3 F (36.8 C)  98.2 F (36.8 C)   TempSrc: Oral  Oral   Resp: 18  18   Height:      Weight:   191 lb 3.2 oz (86.728 kg)   SpO2: 98% 88% 98% 98%   General:  No distress Lungs:  Basilar crackles, positive  Wheezing decreased Heart:  Irregular Abdomen:  Positive bowel sounds, no rebound no guarding Extremities:  Moderate edema Neuro:  Nonfocal   LABS:  Results for orders placed or performed during the hospital encounter of 05/02/15 (from the past 24 hour(s))  Basic metabolic panel     Status: Abnormal   Collection Time: 05/11/15  3:48 AM  Result Value Ref Range   Sodium 140 135 - 145 mmol/L   Potassium 4.7 3.5 - 5.1 mmol/L   Chloride 99 (L) 101 - 111 mmol/L   CO2 33 (H) 22 - 32 mmol/L   Glucose, Bld 126 (H) 65 - 99 mg/dL   BUN 67 (H) 6 - 20 mg/dL   Creatinine, Ser 1.611.78 (H) 0.44 - 1.00 mg/dL   Calcium 8.6 (L) 8.9 - 10.3 mg/dL   GFR calc non Af Amer 25 (L) >60 mL/min   GFR calc Af Amer 29 (L) >60 mL/min   Anion gap 8 5 - 15    Intake/Output Summary (Last 24 hours) at 05/11/15 1159 Last data filed at 05/11/15 1009  Gross per 24 hour  Intake    723 ml  Output    850 ml  Net   -127 ml   TELEMETRY:    Atrial flutter with variable conduction.  No sustained pauses.  05/11/2015  ANKLE FILMS:   There is moderate generalized soft tissue swelling over the left ankle. Ankle mortise is within normal. There is no acute fracture or dislocation. There are mild degenerate changes over the midfoot and hindfoot. Small to moderate size inferior calcaneal spur present. Mild atherosclerotic disease is present.  ASSESSMENT AND PLAN:  ATRIAL FLUTTER:    Eliquis was started.    Rate OK.     DYSPNEA:   Pulmonary evaluation appreciated.  CT unrevealing.  Further plans for chronic and acute pulmonary meds per CCM.    CARDIOMYOPATHY:  EF 35% with moderate MR.  Medical management.   Fluid restrict and continue current diuresis.  Her edema is much improved. -4.1L  ANKLE PAIN:  Unclear etiology.   Soft tissue swelling over the ankle.  No fracture.  Uric acid level is increased.  Dr. Antoine PocheHochrein treating for possible gout.  This is actually a limiting factor as she cannot ambulate at all.    CAD:  No evidence of active ischemia. CABG  CKD:  Creat is 1.78. Mildly reduced from yesterday. I will change to her home diuretic of Demadex 40/20.    Follow   DM:  A1C 5.2.    HTN:  BP is trending down.  I will reduce hydralazine.    DISPOSITION:  She is at least amenable to short term NHP with rehab.  Staff is working on this.  She will need to stay over the weekend.    Bharat Antillon 05/11/2015 11:59 AM

## 2015-05-12 LAB — BASIC METABOLIC PANEL
ANION GAP: 8 (ref 5–15)
BUN: 75 mg/dL — ABNORMAL HIGH (ref 6–20)
CO2: 34 mmol/L — AB (ref 22–32)
Calcium: 8.5 mg/dL — ABNORMAL LOW (ref 8.9–10.3)
Chloride: 97 mmol/L — ABNORMAL LOW (ref 101–111)
Creatinine, Ser: 1.99 mg/dL — ABNORMAL HIGH (ref 0.44–1.00)
GFR calc non Af Amer: 22 mL/min — ABNORMAL LOW (ref >60)
GFR, EST AFRICAN AMERICAN: 25 mL/min — AB (ref >60)
GLUCOSE: 112 mg/dL — AB (ref 65–99)
POTASSIUM: 4.5 mmol/L (ref 3.5–5.1)
Sodium: 139 mmol/L (ref 135–145)

## 2015-05-12 MED ORDER — COLCHICINE 0.6 MG PO TABS
0.3000 mg | ORAL_TABLET | Freq: Every day | ORAL | Status: DC
  Administered 2015-05-13 – 2015-05-15 (×3): 0.3 mg via ORAL
  Filled 2015-05-12 (×3): qty 1

## 2015-05-12 NOTE — Progress Notes (Signed)
SUBJECTIVE:  Breathing improved but still with cough.  Foot still painful to bear weight  OBJECTIVE:   Vitals:   Filed Vitals:   05/11/15 1957 05/11/15 2120 05/12/15 0417 05/12/15 0748  BP:  111/58 97/53   Pulse:  106 69   Temp:  99.1 F (37.3 C) 98.2 F (36.8 C)   TempSrc:  Oral Oral   Resp:  20 20   Height:      Weight:   189 lb 3.2 oz (85.821 kg)   SpO2: 97% 100% 100% 97%   I&O's:   Intake/Output Summary (Last 24 hours) at 05/12/15 1035 Last data filed at 05/12/15 0900  Gross per 24 hour  Intake   1080 ml  Output    802 ml  Net    278 ml   TELEMETRY: Reviewed telemetry pt in NSR:     PHYSICAL EXAM General: Well developed, well nourished, in no acute distress Head: Eyes PERRLA, No xanthomas.   Normal cephalic and atramatic  Lungs:   Diffuse rhonchi throughout Heart:   HRRR S1 S2 Pulses are 2+ & equal. Abdomen: Bowel sounds are positive, abdomen soft and non-tender without masses  Extremities:   No clubbing, cyanosis.  DP +1.  2+ pitting edema of feet Neuro: Alert and oriented X 3. Psych:  Good affect, responds appropriately   LABS: Basic Metabolic Panel:  Recent Labs  16/10/96 0348 05/12/15 0403  NA 140 139  K 4.7 4.5  CL 99* 97*  CO2 33* 34*  GLUCOSE 126* 112*  BUN 67* 75*  CREATININE 1.78* 1.99*  CALCIUM 8.6* 8.5*   Liver Function Tests: No results for input(s): AST, ALT, ALKPHOS, BILITOT, PROT, ALBUMIN in the last 72 hours. No results for input(s): LIPASE, AMYLASE in the last 72 hours. CBC: No results for input(s): WBC, NEUTROABS, HGB, HCT, MCV, PLT in the last 72 hours. Cardiac Enzymes: No results for input(s): CKTOTAL, CKMB, CKMBINDEX, TROPONINI in the last 72 hours. BNP: Invalid input(s): POCBNP D-Dimer: No results for input(s): DDIMER in the last 72 hours. Hemoglobin A1C: No results for input(s): HGBA1C in the last 72 hours. Fasting Lipid Panel: No results for input(s): CHOL, HDL, LDLCALC, TRIG, CHOLHDL, LDLDIRECT in the last 72  hours. Thyroid Function Tests: No results for input(s): TSH, T4TOTAL, T3FREE, THYROIDAB in the last 72 hours.  Invalid input(s): FREET3 Anemia Panel: No results for input(s): VITAMINB12, FOLATE, FERRITIN, TIBC, IRON, RETICCTPCT in the last 72 hours. Coag Panel:   Lab Results  Component Value Date   INR 1.13 10/06/2010   INR 1.2 10/13/2008   INR 1.1 10/12/2008    RADIOLOGY: Dg Chest 2 View  05/06/2015  CLINICAL DATA:  Dyspnea, wheezing, cough and decreased breath sounds. History of cardiomyopathy with heart failure. EXAM: CHEST - 2 VIEW COMPARISON:  05/02/2015 FINDINGS: Stable moderate cardiac enlargement. Mild pulmonary venous hypertensive changes remain without evidence of overt airspace edema. On the lateral view there is suggestion of very small bilateral posterior pleural effusions. Scattered scarring and atelectasis present. No focal airspace consolidation. IMPRESSION: Stable cardiomegaly with pulmonary venous hypertension and no overt airspace edema. Small bilateral posterior pleural effusions are present. Electronically Signed   By: Irish Lack M.D.   On: 05/06/2015 11:16   Dg Chest 2 View  05/02/2015  CLINICAL DATA:  82 year old female with hypotension and generalized weakness. EXAM: CHEST  2 VIEW COMPARISON:  Chest x-ray 05/01/2015. FINDINGS: Lung volumes are low. No acute consolidative airspace disease. Small bilateral pleural effusions. Cephalization of the pulmonary  vasculature, with indistinct interstitial markings, suggesting mild interstitial pulmonary edema. Enlargement of the cardiopericardial silhouette which has a "water bottle" appearance, which could suggest an enlarging pericardial effusion. This is similar to the recent prior study, but is clearly new compared to more remote prior examination from 10/07/2011. Upper mediastinal contour is distorted by patient's rotation of the right. Atherosclerosis in the thoracic aorta. Status post median sternotomy for CABG.  IMPRESSION: 1. Progressive enlargement of the cardiopericardial silhouette which now has an appearance most concerning for enlarging pericardial effusion. Correlation with echocardiography is strongly recommended. 2. In addition the cardiac enlargement there is cephalization of the pulmonary vasculature, and evidence of mild interstitial pulmonary edema and small bilateral pleural effusions; findings concerning for congestive heart failure. 3. Atherosclerosis. Electronically Signed   By: Trudie Reedaniel  Entrikin M.D.   On: 05/02/2015 14:46   Dg Chest 2 View  05/01/2015  CLINICAL DATA:  Initial encounter for several day history of shortness of breath. EXAM: CHEST  2 VIEW COMPARISON:  10/07/2011. FINDINGS: AP and lateral views of the chest show hyperexpansion without focal airspace consolidation. No pulmonary edema or pleural effusion. Interstitial markings are diffusely coarsened with chronic features. The cardio pericardial silhouette is enlarged. Patient is status post CABG. Bones are diffusely demineralized. IMPRESSION: Cardiomegaly with hyperexpansion and chronic interstitial lung disease. No acute cardiopulmonary findings. Electronically Signed   By: Kennith CenterEric  Mansell M.D.   On: 05/01/2015 16:46   Dg Ankle 2 Views Left  05/09/2015  CLINICAL DATA:  Left ankle pain.  No injury. EXAM: LEFT ANKLE - 2 VIEW COMPARISON:  None. FINDINGS: There is moderate generalized soft tissue swelling over the left ankle. Ankle mortise is within normal. There is no acute fracture or dislocation. There are mild degenerate changes over the midfoot and hindfoot. Small to moderate size inferior calcaneal spur present. Mild atherosclerotic disease is present. IMPRESSION: Moderate generalized soft tissue swelling. No acute bony abnormality. Electronically Signed   By: Elberta Fortisaniel  Boyle M.D.   On: 05/09/2015 14:26   Ct Chest High Resolution  05/10/2015  CLINICAL DATA:  82 year old female with history of dyspnea. Evaluate for interstitial lung  disease. EXAM: CT CHEST WITHOUT CONTRAST TECHNIQUE: Multidetector CT imaging of the chest was performed following the standard protocol without intravenous contrast. High resolution imaging of the lungs, as well as inspiratory and expiratory imaging, was performed. COMPARISON:  No priors. FINDINGS: Mediastinum/Lymph Nodes: Heart size is mildly enlarged. There is no significant pericardial fluid, thickening or pericardial calcification. There is atherosclerosis of the thoracic aorta, the great vessels of the mediastinum and the coronary arteries, including calcified atherosclerotic plaque in the left main, left anterior descending, left circumflex and right coronary arteries. Status post median sternotomy for CABG, including LIMA to the LAD. Mild dilatation of the pulmonic trunk (3.4 cm in diameter). No pathologically enlarged mediastinal or hilar lymph nodes. Please note that accurate exclusion of hilar adenopathy is limited on noncontrast CT scans. Esophagus is unremarkable in appearance. Lungs/Pleura: Small bilateral pleural effusions (right greater than left) with some associated passive atelectasis in the lower lobes of the lungs bilaterally. No acute consolidative airspace disease. No suspicious appearing pulmonary nodules or masses. High-resolution images demonstrate no significant regions of ground-glass attenuation, subpleural reticulation, parenchymal banding or frank honeycombing to suggest interstitial lung disease. There is minimal bronchiectasis and some thick walled cystic change in the medial aspects of the lower lobes of the lungs bilaterally, strongly favored to represent sequela of prior infection. Inspiratory and expiratory imaging is unremarkable. Very mild centrilobular  and paraseptal emphysema. Upper abdomen: Unremarkable. Musculoskeletal: There are no aggressive appearing lytic or blastic lesions noted in the visualized portions of the skeleton. Median sternotomy wires. IMPRESSION: 1. No  evidence of interstitial lung disease. 2. There are some areas of mild fibrotic changes in the medial aspects of the lower lobes of the lungs bilaterally with some associated cylindrical bronchiectasis, strongly favored to represent scarring at sites of of prior infection. 3. Mild diffuse bronchial wall thickening with mild centrilobular and paraseptal emphysema. 4. Small bilateral pleural effusions (right greater than left). 5. Mild cardiomegaly. 6. Dilatation of the pulmonic trunk (3.4 cm in diameter), suggesting pulmonary arterial hypertension. 7. Atherosclerosis, including left main and 3 vessel coronary artery disease. Status post median sternotomy for CABG, including LIMA to the LAD. Electronically Signed   By: Trudie Reed M.D.   On: 05/10/2015 08:18   Dg Chest Port 1 View  05/09/2015  CLINICAL DATA:  SOB, productive cough, and wheezing started last night. NO FEVER. ? Fluid overload. Hx HTN, acute on chronic CHF, A-Flutter, MI, CABG (2008), diabetes, and asthma. EXAM: PORTABLE CHEST 1 VIEW COMPARISON:  05/06/2015 FINDINGS: Cardiomediastinal silhouette is within normal limits. The lungs are clear. There are no focal consolidations or pleural effusions. No evidence for pulmonary edema. IMPRESSION: Cardiomegaly. Electronically Signed   By: Norva Pavlov M.D.   On: 05/09/2015 15:34    ASSESSMENT AND PLAN:  ATRIAL FLUTTER: Eliquis was started. Rate fairly stable.   DYSPNEA: Pulmonary evaluation appreciated. CT unrevealing. Further plans for chronic and acute pulmonary meds per CCM.   CARDIOMYOPATHY: EF 35% with moderate MR. Medical management. Fluid restrict and continue current diuresis. Her edema is much improved but still present. -3.9L.  Her creatinine has bumped today.  Will contiue diuretic and hold ARB.  Continue nitrates/hydralazine.  ANKLE PAIN: Unclear etiology. Soft tissue swelling over the ankle. No fracture. Uric acid level is increased. Currently treating  for possible gout. This is actually a limiting factor as she cannot ambulate at all. Per pharmacy will decrease dose of colchicine to 0.3mg  daily.    CAD: No evidence of active ischemia. CABG  CKD: Creat bumped today: 1.78-->1.99. Changed back to her home diuretic of Demadex 40/20 yesterday.Will hold ARB. Follow  DM: A1C 5.2.   HTN: BP soft.Hydralazine decreased yesterday.   DISPOSITION: She is at least amenable to short term NHP with rehab. Staff is working on this. She will need to stay over the weekend.    Quintella Reichert, MD  05/12/2015  10:35 AM

## 2015-05-13 LAB — BASIC METABOLIC PANEL
Anion gap: 10 (ref 5–15)
BUN: 83 mg/dL — AB (ref 6–20)
CO2: 35 mmol/L — AB (ref 22–32)
Calcium: 8.6 mg/dL — ABNORMAL LOW (ref 8.9–10.3)
Chloride: 95 mmol/L — ABNORMAL LOW (ref 101–111)
Creatinine, Ser: 2.15 mg/dL — ABNORMAL HIGH (ref 0.44–1.00)
GFR calc Af Amer: 23 mL/min — ABNORMAL LOW (ref >60)
GFR, EST NON AFRICAN AMERICAN: 20 mL/min — AB (ref >60)
GLUCOSE: 115 mg/dL — AB (ref 65–99)
POTASSIUM: 4.7 mmol/L (ref 3.5–5.1)
Sodium: 140 mmol/L (ref 135–145)

## 2015-05-13 NOTE — Progress Notes (Signed)
Physical Therapy Treatment Patient Details Name: Kristina MarvelBarbara R Rizzi MRN: [id number] DOB: 01/05/1928 Today's Date: 05/13/2015    History of Present Illness 82 yo female with onset of CHF and pleural effusion, admitted for tachy brady syndrom was referred to PT for evaluation of gait.   Pt has history of cardiomyopathy, CHF, CABG, CAD    PT Comments    Pt admitted with above diagnosis. Pt currently with functional limitations due to balance and endurance deficits. Pt  Is able to ambulate with RW with better postural stability than previously. Will continue to progress as able.  Pt will benefit from skilled PT to increase their independence and safety with mobility to allow discharge to the venue listed below.    Follow Up Recommendations  SNF     Equipment Recommendations  None recommended by PT    Recommendations for Other Services       Precautions / Restrictions Precautions Precautions: Fall Precaution Comments: telemetry Restrictions Weight Bearing Restrictions: No Other Position/Activity Restrictions: cleared for DVT and L ankle fracture    Mobility  Bed Mobility                  Transfers Overall transfer level: Needs assistance Equipment used: Rolling walker (2 wheeled) Transfers: Sit to/from Stand Sit to Stand: Min assist         General transfer comment: min assist to power up.  Steady on feet once up.    Ambulation/Gait Ambulation/Gait assistance: Min guard Ambulation Distance (Feet): 125 Feet Assistive device: Rolling walker (2 wheeled) Gait Pattern/deviations: Step-through pattern;Decreased stride length;Decreased step length - right;Decreased weight shift to left;Decreased stance time - left;Antalgic;Trunk flexed;Wide base of support   Gait velocity interpretation: Below normal speed for age/gender General Gait Details: Pt with fairly good sequence of steps and RW.    Stairs            Wheelchair Mobility    Modified Rankin (Stroke  Patients Only)       Balance Overall balance assessment: Needs assistance Sitting-balance support: No upper extremity supported;Feet supported Sitting balance-Leahy Scale: Fair     Standing balance support: Bilateral upper extremity supported;During functional activity Standing balance-Leahy Scale: Poor Standing balance comment: relies on UEs for support.                    Cognition Arousal/Alertness: Awake/alert Behavior During Therapy: WFL for tasks assessed/performed Overall Cognitive Status: Within Functional Limits for tasks assessed       Memory: Decreased short-term memory              Exercises General Exercises - Lower Extremity Ankle Circles/Pumps: AROM;Both;10 reps;Supine Quad Sets: AROM;Both;10 reps;Supine Heel Slides: AROM;Both;10 reps;Supine Straight Leg Raises: AROM;Both;10 reps;Supine    General Comments        Pertinent Vitals/Pain Pain Assessment: No/denies pain  VSS    Home Living                      Prior Function            PT Goals (current goals can now be found in the care plan section) Progress towards PT goals: Progressing toward goals    Frequency  Min 2X/week    PT Plan Current plan remains appropriate    Co-evaluation             End of Session Equipment Utilized During Treatment: Gait belt;Oxygen Activity Tolerance: Patient limited by fatigue Patient left: in chair;with call bell/phone within  reach;with chair alarm set     Time: 0981-1914 PT Time Calculation (min) (ACUTE ONLY): 18 min  Charges:  $Gait Training: 8-22 mins                    G Codes:      Berline Lopes 06-12-15, 4:13 PM [employer] Acute Rehabilitation [phone] (pager)

## 2015-05-13 NOTE — Clinical Documentation Improvement (Signed)
Cardiology  Can the diagnosis of CKD be further specified?   CKD Stage I - GFR greater than or equal to 90  CKD Stage II - GFR 60-89  CKD Stage III - GFR 30-59  CKD Stage IV - GFR 15-29  CKD Stage V - GFR < 15  ESRD (End Stage Renal Disease)  Other condition  Unable to clinically determine   Supporting Information: : (risk factors, signs and symptoms, diagnostics, treatment) Documentation states CKD and progress notes outline treatment plan.  Previous query sent whether there was acute kidney failure or just CKD.  Need to know if CKD only - then stage.  If acute on chronic, then need diagnosis of acute on chronic and stage of CKD.  Please exercise your independent, professional judgment when responding. A specific answer is not anticipated or expected.   Thank [name], Marrah Vanevery L [facility] [place] [phone]

## 2015-05-13 NOTE — Progress Notes (Signed)
Patient Name: [name] Date of Encounter: 05/13/2015   SUBJECTIVE  Feeling well. No chest pain, sob or palpitations. Still intermittent cough. Ankle pain improved.   CURRENT MEDS . apixaban  2.5 mg Oral BID  . atorvastatin  20 mg Oral q1800  . bisoprolol  5 mg Oral Daily  . budesonide  0.25 mg Nebulization BID  . cholecalciferol  1,000 Units Oral Daily  . colchicine  0.3 mg Oral Daily  . gabapentin  300 mg Oral QHS  . hydrALAZINE  10 mg Oral 3 times per day  . hydroxypropyl methylcellulose / hypromellose  1 drop Both Eyes QID  . ipratropium-albuterol  3 mL Nebulization TID  . sodium chloride  3 mL Intravenous Q12H  . torsemide  20 mg Oral QPM  . torsemide  40 mg Oral Daily    OBJECTIVE  Filed Vitals:   05/12/15 2002 05/12/15 2045 05/13/15 0416 05/13/15 0725  BP:  119/69 108/73   Pulse:  102 91   Temp:  98.2 F (36.8 C) 98 F (36.7 C)   TempSrc:  Oral Oral   Resp:  18 19   Height:      Weight:      SpO2: 99% 100% 100% 95%    Intake/Output Summary (Last 24 hours) at 05/13/15 1009 Last data filed at 05/13/15 0600  Gross per 24 hour  Intake    723 ml  Output    800 ml  Net    -77 ml   Filed Weights   05/10/15 0536 05/11/15 0418 05/12/15 0417  Weight: 193 lb 8 oz (87.771 kg) 191 lb 3.2 oz (86.728 kg) 189 lb 3.2 oz (85.821 kg)    PHYSICAL EXAM  General: Pleasant, NAD. Neuro: Alert and oriented X 3. Moves all extremities spontaneously. Psych: Normal affect. HEENT:  Normal  Neck: Supple without bruits or JVD. Lungs:  Resp regular and unlabored. diffuse wheezing with rhonchi.  Heart: RRR no s3, s4, or murmurs. Abdomen: Soft, non-tender, non-distended, BS + x 4.  Extremities: No clubbing, cyanosis or edema. DP/PT/Radials 2+ and equal bilaterally.  Accessory Clinical Findings  CBC No results for input(s): WBC, NEUTROABS, HGB, HCT, MCV, PLT in the last 72 hours. Basic Metabolic Panel  Recent Labs  05/12/15 0403 05/13/15 0546  NA 139 140  K 4.5  4.7  CL 97* 95*  CO2 34* 35*  GLUCOSE 112* 115*  BUN 75* 83*  CREATININE 1.99* 2.15*  CALCIUM 8.5* 8.6*    TELE  Sinus rhythm   Radiology/Studies  Dg Chest 2 View  05/06/2015  CLINICAL DATA:  Dyspnea, wheezing, cough and decreased breath sounds. History of cardiomyopathy with heart failure. EXAM: CHEST - 2 VIEW COMPARISON:  05/02/2015 FINDINGS: Stable moderate cardiac enlargement. Mild pulmonary venous hypertensive changes remain without evidence of overt airspace edema. On the lateral view there is suggestion of very small bilateral posterior pleural effusions. Scattered scarring and atelectasis present. No focal airspace consolidation. IMPRESSION: Stable cardiomegaly with pulmonary venous hypertension and no overt airspace edema. Small bilateral posterior pleural effusions are present. Electronically Signed   By: Irish LackGlenn  Yamagata M.D.   On: 05/06/2015 11:16   Dg Chest 2 View  05/02/2015  CLINICAL DATA:  82 year old female with hypotension and generalized weakness. EXAM: CHEST  2 VIEW COMPARISON:  Chest x-ray 05/01/2015. FINDINGS: Lung volumes are low. No acute consolidative airspace disease. Small bilateral pleural effusions. Cephalization of the pulmonary vasculature, with indistinct interstitial markings, suggesting mild interstitial pulmonary edema. Enlargement of the cardiopericardial  silhouette which has a "water bottle" appearance, which could suggest an enlarging pericardial effusion. This is similar to the recent prior study, but is clearly new compared to more remote prior examination from 10/07/2011. Upper mediastinal contour is distorted by patient's rotation of the right. Atherosclerosis in the thoracic aorta. Status post median sternotomy for CABG. IMPRESSION: 1. Progressive enlargement of the cardiopericardial silhouette which now has an appearance most concerning for enlarging pericardial effusion. Correlation with echocardiography is strongly recommended. 2. In addition the  cardiac enlargement there is cephalization of the pulmonary vasculature, and evidence of mild interstitial pulmonary edema and small bilateral pleural effusions; findings concerning for congestive heart failure. 3. Atherosclerosis. Electronically Signed   By: Trudie Reed M.D.   On: 05/02/2015 14:46   Dg Chest 2 View  05/01/2015  CLINICAL DATA:  Initial encounter for several day history of shortness of breath. EXAM: CHEST  2 VIEW COMPARISON:  10/07/2011. FINDINGS: AP and lateral views of the chest show hyperexpansion without focal airspace consolidation. No pulmonary edema or pleural effusion. Interstitial markings are diffusely coarsened with chronic features. The cardio pericardial silhouette is enlarged. Patient is status post CABG. Bones are diffusely demineralized. IMPRESSION: Cardiomegaly with hyperexpansion and chronic interstitial lung disease. No acute cardiopulmonary findings. Electronically Signed   By: Kennith Center M.D.   On: 05/01/2015 16:46   Dg Ankle 2 Views Left  05/09/2015  CLINICAL DATA:  Left ankle pain.  No injury. EXAM: LEFT ANKLE - 2 VIEW COMPARISON:  None. FINDINGS: There is moderate generalized soft tissue swelling over the left ankle. Ankle mortise is within normal. There is no acute fracture or dislocation. There are mild degenerate changes over the midfoot and hindfoot. Small to moderate size inferior calcaneal spur present. Mild atherosclerotic disease is present. IMPRESSION: Moderate generalized soft tissue swelling. No acute bony abnormality. Electronically Signed   By: Elberta Fortis M.D.   On: 05/09/2015 14:26   Ct Chest High Resolution  05/10/2015  CLINICAL DATA:  82 year old female with history of dyspnea. Evaluate for interstitial lung disease. EXAM: CT CHEST WITHOUT CONTRAST TECHNIQUE: Multidetector CT imaging of the chest was performed following the standard protocol without intravenous contrast. High resolution imaging of the lungs, as well as inspiratory and  expiratory imaging, was performed. COMPARISON:  No priors. FINDINGS: Mediastinum/Lymph Nodes: Heart size is mildly enlarged. There is no significant pericardial fluid, thickening or pericardial calcification. There is atherosclerosis of the thoracic aorta, the great vessels of the mediastinum and the coronary arteries, including calcified atherosclerotic plaque in the left main, left anterior descending, left circumflex and right coronary arteries. Status post median sternotomy for CABG, including LIMA to the LAD. Mild dilatation of the pulmonic trunk (3.4 cm in diameter). No pathologically enlarged mediastinal or hilar lymph nodes. Please note that accurate exclusion of hilar adenopathy is limited on noncontrast CT scans. Esophagus is unremarkable in appearance. Lungs/Pleura: Small bilateral pleural effusions (right greater than left) with some associated passive atelectasis in the lower lobes of the lungs bilaterally. No acute consolidative airspace disease. No suspicious appearing pulmonary nodules or masses. High-resolution images demonstrate no significant regions of ground-glass attenuation, subpleural reticulation, parenchymal banding or frank honeycombing to suggest interstitial lung disease. There is minimal bronchiectasis and some thick walled cystic change in the medial aspects of the lower lobes of the lungs bilaterally, strongly favored to represent sequela of prior infection. Inspiratory and expiratory imaging is unremarkable. Very mild centrilobular and paraseptal emphysema. Upper abdomen: Unremarkable. Musculoskeletal: There are no aggressive appearing lytic or  blastic lesions noted in the visualized portions of the skeleton. Median sternotomy wires. IMPRESSION: 1. No evidence of interstitial lung disease. 2. There are some areas of mild fibrotic changes in the medial aspects of the lower lobes of the lungs bilaterally with some associated cylindrical bronchiectasis, strongly favored to represent  scarring at sites of of prior infection. 3. Mild diffuse bronchial wall thickening with mild centrilobular and paraseptal emphysema. 4. Small bilateral pleural effusions (right greater than left). 5. Mild cardiomegaly. 6. Dilatation of the pulmonic trunk (3.4 cm in diameter), suggesting pulmonary arterial hypertension. 7. Atherosclerosis, including left main and 3 vessel coronary artery disease. Status post median sternotomy for CABG, including LIMA to the LAD. Electronically Signed   By: Trudie Reed M.D.   On: 05/10/2015 08:18   Dg Chest Port 1 View  05/09/2015  CLINICAL DATA:  SOB, productive cough, and wheezing started last night. NO FEVER. ? Fluid overload. Hx HTN, acute on chronic CHF, A-Flutter, MI, CABG (2008), diabetes, and asthma. EXAM: PORTABLE CHEST 1 VIEW COMPARISON:  05/06/2015 FINDINGS: Cardiomediastinal silhouette is within normal limits. The lungs are clear. There are no focal consolidations or pleural effusions. No evidence for pulmonary edema. IMPRESSION: Cardiomegaly. Electronically Signed   By: Norva Pavlov M.D.   On: 05/09/2015 15:34    ASSESSMENT AND PLAN  ATRIAL FLUTTER: Eliquis was started. Rate fairly stable.   DYSPNEA: Pulmonary evaluation appreciated. CT unrevealing.PCCM stated on Pulmicort and Duoneb. Advised smoking cessation. Will request to see today for continued wheezing and cough. She is coughing up phlegm. Suspect will needs abx.   CARDIOMYOPATHY: EF 35% with moderate MR. Medical management. Fluid restrict and continue current diuresis. Her edema is much improved but still present. -3.9L. Her creatinine has bumped today. Will contiue diuretic and hold ARB. Continue nitrates/hydralazine.  ANKLE PAIN: Unclear etiology. Soft tissue swelling over the ankle. No fracture. Uric acid level is increased. Currently treating for possible gout - Per pharmacy will decrease dose of colchicine to 0.3mg  daily. Now pain improved.   CAD: No  evidence of active ischemia. CABG  CKD: Creat bumped 1.78-->1.99-->2.15 since changed back to her home diuretic of Demadex 40/20. Consider reducing dose. Will hold ARB. Follow Scr.   DM: A1C 5.2.   HTN: BP soft.Hydralazine decreased. Now relatively stable.    Dispo: Continued PT. Social worker to find bed. Likely DC tomorrow.    Signed, Bhagat,Bhavinkumar PA-C   Agree with note by Vin Bhagat PA-C  Good diuresis Feeling clinically improved. On PO demadex. SCr slight;y increased. Periph edema improved. Ankle pain better on colchicine. ARB on hold. Follow renal FXN. Still has a productive cough and wheezes. Will ask  to see again. PT. Hopefully home 24-48 hours.   Runell Gess, M.D., FACP, [facility], [facility] [address] [place], [place]  [address]  [phone] 05/13/2015 10:40 AM

## 2015-05-13 NOTE — Care Management Important Message (Signed)
Important Message  Patient Details  Name: Kristina MarvelBarbara R Richardson MRN: [id number] Date of Birth: 11/10/1927   Medicare Important Message Given:  Yes    Rayvon CharSTUTTS, Shaquel Josephson G 05/13/2015, 1:13 PMImportant Message  Patient Details  Name: Kristina MarvelBarbara R Richardson MRN: [id number] Date of Birth: 05/20/1927   Medicare Important Message Given:  Yes    Quinteria Chisum G 05/13/2015, 1:13 PM

## 2015-05-13 NOTE — Care Management Note (Signed)
Case Management Note  Patient Details  Name: Kristina MarvelBarbara R Sieh MRN: [id number] Date of Birth: 12/21/1927  Subjective/Objective:             According to note - possible dc back to SNF on 1-10.  Conchita ParisNancy Fox with [facility] at [phone] updated.        Action/Plan:   Expected Discharge Date:                  Expected Discharge Plan:  Skilled Nursing Facility  In-House Referral:  Clinical Social Work  Discharge planning Services  CM Consult  Post Acute Care Choice:    Choice offered to:     DME Arranged:    DME Agency:     HH Arranged:    HH Agency:     Status of Service:  In process, will continue to follow  Medicare Important Message Given:  Yes Date Medicare IM Given:    Medicare IM give by:    Date Additional Medicare IM Given:    Additional Medicare Important Message give by:     If discussed at Long Length of Stay Meetings, dates discussed:    Additional Comments:  Vangie BickerBrown, Tashe Purdon Jane, RN 05/13/2015, 2:27 PM

## 2015-05-13 NOTE — Progress Notes (Signed)
Updated [facility] of probable dc for tomorrow- they are ready and expecting her at dc.  Reece LevyJanet Tylor Courtwright, MSW, Theresia MajorsLCSWA  [phone]

## 2015-05-14 LAB — CBC
HCT: 34.6 % — ABNORMAL LOW (ref 36.0–46.0)
Hemoglobin: 10.7 g/dL — ABNORMAL LOW (ref 12.0–15.0)
MCH: 31.2 pg (ref 26.0–34.0)
MCHC: 30.9 g/dL (ref 30.0–36.0)
MCV: 100.9 fL — AB (ref 78.0–100.0)
PLATELETS: 128 10*3/uL — AB (ref 150–400)
RBC: 3.43 MIL/uL — ABNORMAL LOW (ref 3.87–5.11)
RDW: 13 % (ref 11.5–15.5)
WBC: 3 10*3/uL — AB (ref 4.0–10.5)

## 2015-05-14 LAB — BASIC METABOLIC PANEL
Anion gap: 7 (ref 5–15)
BUN: 91 mg/dL — AB (ref 6–20)
CHLORIDE: 97 mmol/L — AB (ref 101–111)
CO2: 35 mmol/L — ABNORMAL HIGH (ref 22–32)
CREATININE: 1.95 mg/dL — AB (ref 0.44–1.00)
Calcium: 8.4 mg/dL — ABNORMAL LOW (ref 8.9–10.3)
GFR calc Af Amer: 26 mL/min — ABNORMAL LOW (ref >60)
GFR, EST NON AFRICAN AMERICAN: 22 mL/min — AB (ref >60)
Glucose, Bld: 118 mg/dL — ABNORMAL HIGH (ref 65–99)
Potassium: 4.7 mmol/L (ref 3.5–5.1)
SODIUM: 139 mmol/L (ref 135–145)

## 2015-05-14 MED ORDER — BISOPROLOL FUMARATE 10 MG PO TABS
10.0000 mg | ORAL_TABLET | Freq: Every day | ORAL | Status: DC
  Administered 2015-05-15: 10 mg via ORAL
  Filled 2015-05-14: qty 1

## 2015-05-14 MED ORDER — GUAIFENESIN ER 600 MG PO TB12
600.0000 mg | ORAL_TABLET | Freq: Two times a day (BID) | ORAL | Status: DC
  Administered 2015-05-14 – 2015-05-15 (×3): 600 mg via ORAL
  Filled 2015-05-14 (×3): qty 1

## 2015-05-14 MED ORDER — BISOPROLOL FUMARATE 5 MG PO TABS
5.0000 mg | ORAL_TABLET | Freq: Once | ORAL | Status: AC
  Administered 2015-05-14: 5 mg via ORAL
  Filled 2015-05-14: qty 1

## 2015-05-14 NOTE — Progress Notes (Signed)
No change in d/c plan. Hopefully will be stable tomorrow per MD for [facility]. Bed remains in place at SNF; discussed with Irving BurtonEmily- [facility] SW. CSW services will continue to monitor and assist with d/c when medically stable.  Lorri Frederickonna T. Jaci LazierCrowder, [place]LCS[place] [phone]

## 2015-05-14 NOTE — Progress Notes (Signed)
ReDS Vest Discharge Study  Results of ReDS reading  Your patient is in the Unblinded arm of the Vest at Discharge study.  The ReDS reading is:   ( < 39) 38  Your patient is ok for discharge per study protocol.   Thank You   The research team

## 2015-05-14 NOTE — Progress Notes (Addendum)
Patient Name: [name] Date of Encounter: 05/14/2015   SUBJECTIVE  Feeling well. No chest pain, sob or palpitations. Still intermittent cough. Ankle pain resolved. Requested PCCM consult, however did not seen patient yet. Undergoing vest fluid measurement.   CURRENT MEDS . apixaban  2.5 mg Oral BID  . atorvastatin  20 mg Oral q1800  . bisoprolol  5 mg Oral Daily  . budesonide  0.25 mg Nebulization BID  . cholecalciferol  1,000 Units Oral Daily  . colchicine  0.3 mg Oral Daily  . gabapentin  300 mg Oral QHS  . hydrALAZINE  10 mg Oral 3 times per day  . hydroxypropyl methylcellulose / hypromellose  1 drop Both Eyes QID  . ipratropium-albuterol  3 mL Nebulization TID  . sodium chloride  3 mL Intravenous Q12H  . torsemide  20 mg Oral QPM  . torsemide  40 mg Oral Daily    OBJECTIVE  Filed Vitals:   05/13/15 2056 05/14/15 0001 05/14/15 0516 05/14/15 0936  BP: 124/72 129/80 117/66   Pulse: 101 102 101   Temp: 98.3 F (36.8 C) 98.1 F (36.7 C) 98.1 F (36.7 C)   TempSrc: Oral Oral Oral   Resp: 20 20 18    Height:      Weight:   191 lb 14.4 oz (87.045 kg)   SpO2: 100% 97% 97% 98%    Intake/Output Summary (Last 24 hours) at 05/14/15 1040 Last data filed at 05/14/15 0542  Gross per 24 hour  Intake    120 ml  Output   1600 ml  Net  -1480 ml   Filed Weights   05/11/15 0418 05/12/15 0417 05/14/15 0516  Weight: 191 lb 3.2 oz (86.728 kg) 189 lb 3.2 oz (85.821 kg) 191 lb 14.4 oz (87.045 kg)    PHYSICAL EXAM  General: Pleasant, NAD. Neuro: Alert and oriented X 3. Moves all extremities spontaneously. Psych: Normal affect. HEENT:  Normal  Neck: Supple without bruits or JVD. Lungs:  Resp regular and unlabored. Faint diffuse wheezing with rhonchi and faint rales.  Heart: RRR no s3, s4, or murmurs. Abdomen: Soft, non-tender, non-distended, BS + x 4.  Extremities: No clubbing, cyanosis or edema. DP/PT/Radials 2+ and equal bilaterally.  Accessory Clinical  Findings  CBC  Recent Labs  05/14/15 0508  WBC 3.0*  HGB 10.7*  HCT 34.6*  MCV 100.9*  PLT 128*   Basic Metabolic Panel  Recent Labs  05/13/15 0546 05/14/15 0508  NA 140 139  K 4.7 4.7  CL 95* 97*  CO2 35* 35*  GLUCOSE 115* 118*  BUN 83* 91*  CREATININE 2.15* 1.95*  CALCIUM 8.6* 8.4*    TELE  Sinus rhythm   Radiology/Studies  Dg Chest 2 View  05/06/2015  CLINICAL DATA:  Dyspnea, wheezing, cough and decreased breath sounds. History of cardiomyopathy with heart failure. EXAM: CHEST - 2 VIEW COMPARISON:  05/02/2015 FINDINGS: Stable moderate cardiac enlargement. Mild pulmonary venous hypertensive changes remain without evidence of overt airspace edema. On the lateral view there is suggestion of very small bilateral posterior pleural effusions. Scattered scarring and atelectasis present. No focal airspace consolidation. IMPRESSION: Stable cardiomegaly with pulmonary venous hypertension and no overt airspace edema. Small bilateral posterior pleural effusions are present. Electronically Signed   By: Irish Lack M.D.   On: 05/06/2015 11:16   Dg Chest 2 View  05/02/2015  CLINICAL DATA:  82 year old female with hypotension and generalized weakness. EXAM: CHEST  2 VIEW COMPARISON:  Chest x-ray 05/01/2015. FINDINGS: Lung  volumes are low. No acute consolidative airspace disease. Small bilateral pleural effusions. Cephalization of the pulmonary vasculature, with indistinct interstitial markings, suggesting mild interstitial pulmonary edema. Enlargement of the cardiopericardial silhouette which has a "water bottle" appearance, which could suggest an enlarging pericardial effusion. This is similar to the recent prior study, but is clearly new compared to more remote prior examination from 10/07/2011. Upper mediastinal contour is distorted by patient's rotation of the right. Atherosclerosis in the thoracic aorta. Status post median sternotomy for CABG. IMPRESSION: 1. Progressive enlargement  of the cardiopericardial silhouette which now has an appearance most concerning for enlarging pericardial effusion. Correlation with echocardiography is strongly recommended. 2. In addition the cardiac enlargement there is cephalization of the pulmonary vasculature, and evidence of mild interstitial pulmonary edema and small bilateral pleural effusions; findings concerning for congestive heart failure. 3. Atherosclerosis. Electronically Signed   By: Trudie Reed M.D.   On: 05/02/2015 14:46   Dg Chest 2 View  05/01/2015  CLINICAL DATA:  Initial encounter for several day history of shortness of breath. EXAM: CHEST  2 VIEW COMPARISON:  10/07/2011. FINDINGS: AP and lateral views of the chest show hyperexpansion without focal airspace consolidation. No pulmonary edema or pleural effusion. Interstitial markings are diffusely coarsened with chronic features. The cardio pericardial silhouette is enlarged. Patient is status post CABG. Bones are diffusely demineralized. IMPRESSION: Cardiomegaly with hyperexpansion and chronic interstitial lung disease. No acute cardiopulmonary findings. Electronically Signed   By: Kennith Center M.D.   On: 05/01/2015 16:46   Dg Ankle 2 Views Left  05/09/2015  CLINICAL DATA:  Left ankle pain.  No injury. EXAM: LEFT ANKLE - 2 VIEW COMPARISON:  None. FINDINGS: There is moderate generalized soft tissue swelling over the left ankle. Ankle mortise is within normal. There is no acute fracture or dislocation. There are mild degenerate changes over the midfoot and hindfoot. Small to moderate size inferior calcaneal spur present. Mild atherosclerotic disease is present. IMPRESSION: Moderate generalized soft tissue swelling. No acute bony abnormality. Electronically Signed   By: Elberta Fortis M.D.   On: 05/09/2015 14:26   Ct Chest High Resolution  05/10/2015  CLINICAL DATA:  82 year old female with history of dyspnea. Evaluate for interstitial lung disease. EXAM: CT CHEST WITHOUT CONTRAST  TECHNIQUE: Multidetector CT imaging of the chest was performed following the standard protocol without intravenous contrast. High resolution imaging of the lungs, as well as inspiratory and expiratory imaging, was performed. COMPARISON:  No priors. FINDINGS: Mediastinum/Lymph Nodes: Heart size is mildly enlarged. There is no significant pericardial fluid, thickening or pericardial calcification. There is atherosclerosis of the thoracic aorta, the great vessels of the mediastinum and the coronary arteries, including calcified atherosclerotic plaque in the left main, left anterior descending, left circumflex and right coronary arteries. Status post median sternotomy for CABG, including LIMA to the LAD. Mild dilatation of the pulmonic trunk (3.4 cm in diameter). No pathologically enlarged mediastinal or hilar lymph nodes. Please note that accurate exclusion of hilar adenopathy is limited on noncontrast CT scans. Esophagus is unremarkable in appearance. Lungs/Pleura: Small bilateral pleural effusions (right greater than left) with some associated passive atelectasis in the lower lobes of the lungs bilaterally. No acute consolidative airspace disease. No suspicious appearing pulmonary nodules or masses. High-resolution images demonstrate no significant regions of ground-glass attenuation, subpleural reticulation, parenchymal banding or frank honeycombing to suggest interstitial lung disease. There is minimal bronchiectasis and some thick walled cystic change in the medial aspects of the lower lobes of the lungs bilaterally, strongly  favored to represent sequela of prior infection. Inspiratory and expiratory imaging is unremarkable. Very mild centrilobular and paraseptal emphysema. Upper abdomen: Unremarkable. Musculoskeletal: There are no aggressive appearing lytic or blastic lesions noted in the visualized portions of the skeleton. Median sternotomy wires. IMPRESSION: 1. No evidence of interstitial lung disease. 2.  There are some areas of mild fibrotic changes in the medial aspects of the lower lobes of the lungs bilaterally with some associated cylindrical bronchiectasis, strongly favored to represent scarring at sites of of prior infection. 3. Mild diffuse bronchial wall thickening with mild centrilobular and paraseptal emphysema. 4. Small bilateral pleural effusions (right greater than left). 5. Mild cardiomegaly. 6. Dilatation of the pulmonic trunk (3.4 cm in diameter), suggesting pulmonary arterial hypertension. 7. Atherosclerosis, including left main and 3 vessel coronary artery disease. Status post median sternotomy for CABG, including LIMA to the LAD. Electronically Signed   By: Trudie Reedaniel  Entrikin M.D.   On: 05/10/2015 08:18   Dg Chest Port 1 View  05/09/2015  CLINICAL DATA:  SOB, productive cough, and wheezing started last night. NO FEVER. ? Fluid overload. Hx HTN, acute on chronic CHF, A-Flutter, MI, CABG (2008), diabetes, and asthma. EXAM: PORTABLE CHEST 1 VIEW COMPARISON:  05/06/2015 FINDINGS: Cardiomediastinal silhouette is within normal limits. The lungs are clear. There are no focal consolidations or pleural effusions. No evidence for pulmonary edema. IMPRESSION: Cardiomegaly. Electronically Signed   By: Norva PavlovElizabeth  Brown M.D.   On: 05/09/2015 15:34    ASSESSMENT AND PLAN  ATRIAL FLUTTER: Eliquis was started. Rate fairly stable.   DYSPNEA: Pulmonary evaluation appreciated. CT unrevealing.PCCM stated on Pulmicort and Duoneb. Advised smoking cessation. Still pending to seen by PCCM for wheezing and cough. She is coughing up phlegm, improved today. Suspect will needs abx. Will give Mucinex.   CARDIOMYOPATHY: EF 35% with moderate MR. Medical management. Fluid restrict and continue current diuresis. Her edema is much improved but still present. Diuresed 5.4L. Her creatinine improved today. Will contiue diuretic and hold ARB. Continue nitrates/hydralazine.  ANKLE PAIN: Unclear etiology.  Soft tissue swelling over the ankle. No fracture. Uric acid level is increased. Currently treating for possible gout - Per pharmacy will decrease dose of colchicine to 0.3mg  daily. Pain resolved.   CAD: No evidence of active ischemia. CABG  CKD: As above. Cr improved.   DM: A1C 5.2.   HTN: BP is stable.    Dispo: [facility]. Vest reading of 38.    Signed, Bhagat,Bhavinkumar PA-C   Agree with note by Chelsea AusVin Bhagat PA-C  Breathing improved. Cough improved. On PO demadex. No periph edema. Lungs clear with slight cough and intermittent wheeze. Renal fxn improving. Walking in hall with PT. [facility]. CAFlutter rate controlled on Eliquis.  Runell GessJonathan J. Berry, M.D., FACP, Ambulatory Care CenterFACC, Earl LagosFAHA, [facility] LPFSCAI [facility] [address] [place], [place]  [id number]  [phone] 05/14/2015 10:59 AM

## 2015-05-15 ENCOUNTER — Telehealth: Payer: Self-pay | Admitting: Cardiology

## 2015-05-15 DIAGNOSIS — R000 Tachycardia, unspecified: Secondary | ICD-10-CM | POA: Insufficient documentation

## 2015-05-15 DIAGNOSIS — M7989 Other specified soft tissue disorders: Secondary | ICD-10-CM | POA: Insufficient documentation

## 2015-05-15 DIAGNOSIS — I509 Heart failure, unspecified: Secondary | ICD-10-CM | POA: Insufficient documentation

## 2015-05-15 LAB — BASIC METABOLIC PANEL
Anion gap: 8 (ref 5–15)
BUN: 96 mg/dL — AB (ref 6–20)
CHLORIDE: 96 mmol/L — AB (ref 101–111)
CO2: 35 mmol/L — AB (ref 22–32)
CREATININE: 1.85 mg/dL — AB (ref 0.44–1.00)
Calcium: 8.5 mg/dL — ABNORMAL LOW (ref 8.9–10.3)
GFR calc non Af Amer: 24 mL/min — ABNORMAL LOW (ref >60)
GFR, EST AFRICAN AMERICAN: 27 mL/min — AB (ref >60)
Glucose, Bld: 138 mg/dL — ABNORMAL HIGH (ref 65–99)
POTASSIUM: 4.5 mmol/L (ref 3.5–5.1)
Sodium: 139 mmol/L (ref 135–145)

## 2015-05-15 LAB — CBC
HEMATOCRIT: 34.4 % — AB (ref 36.0–46.0)
Hemoglobin: 10.8 g/dL — ABNORMAL LOW (ref 12.0–15.0)
MCH: 31.5 pg (ref 26.0–34.0)
MCHC: 31.4 g/dL (ref 30.0–36.0)
MCV: 100.3 fL — AB (ref 78.0–100.0)
PLATELETS: 129 10*3/uL — AB (ref 150–400)
RBC: 3.43 MIL/uL — AB (ref 3.87–5.11)
RDW: 13.1 % (ref 11.5–15.5)
WBC: 3.2 10*3/uL — ABNORMAL LOW (ref 4.0–10.5)

## 2015-05-15 MED ORDER — BUDESONIDE 0.25 MG/2ML IN SUSP: 0.2500 mg | Freq: Two times a day (BID) | RESPIRATORY_TRACT | Status: AC

## 2015-05-15 MED ORDER — ATORVASTATIN CALCIUM 20 MG PO TABS: 20.0000 mg | ORAL_TABLET | Freq: Every day | ORAL | Status: DC

## 2015-05-15 MED ORDER — IPRATROPIUM-ALBUTEROL 0.5-2.5 (3) MG/3ML IN SOLN: 3.0000 mL | Freq: Three times a day (TID) | RESPIRATORY_TRACT | Status: AC

## 2015-05-15 MED ORDER — GUAIFENESIN ER 600 MG PO TB12: 600.0000 mg | ORAL_TABLET | Freq: Two times a day (BID) | ORAL | Status: DC

## 2015-05-15 MED ORDER — APIXABAN 2.5 MG PO TABS: 2.5000 mg | ORAL_TABLET | Freq: Two times a day (BID) | ORAL | Status: AC

## 2015-05-15 MED ORDER — HYDRALAZINE HCL 10 MG PO TABS: 10.0000 mg | ORAL_TABLET | Freq: Three times a day (TID) | ORAL | Status: DC

## 2015-05-15 MED ORDER — COLCHICINE 0.6 MG PO TABS: 0.3000 mg | ORAL_TABLET | Freq: Every day | ORAL | Status: DC

## 2015-05-15 MED ORDER — BISOPROLOL FUMARATE 10 MG PO TABS: 10.0000 mg | ORAL_TABLET | Freq: Every day | ORAL | Status: AC

## 2015-05-15 MED ORDER — NITROGLYCERIN 0.4 MG SL SUBL: 0.4000 mg | SUBLINGUAL_TABLET | SUBLINGUAL | Status: AC | PRN

## 2015-05-15 MED ORDER — ALBUTEROL SULFATE (2.5 MG/3ML) 0.083% IN NEBU: 2.5000 mg | INHALATION_SOLUTION | RESPIRATORY_TRACT | Status: DC | PRN

## 2015-05-15 NOTE — Progress Notes (Signed)
Attempted to get pt ready for ambulance to pick her up for [facility] pt stated is not going.  OT in at this  time explained to pt that she will be going to rehab.  Pt declined.   Will f/u with Lupita Leashonna SW.

## 2015-05-15 NOTE — Progress Notes (Addendum)
Pt transferred to: [facility] Anticipated date of transfer: 05/15/2015 Transported by: Ambulance ([facility]) Time Tentatively Scheduled for: 1:30 PM  Family notified: Daughter:  Freddi CheCheryl Fowler  [phone] Nursing to call report; report number provided. DC summary sent to facilty.  CSW contacted by nursing to come talk to patient again as she was refusing SNF placement. After extensive conversation- patient now agreeable (reluctantly) to short term SNF stay of 1-2 weeks maximum for rehab.  CSW notified [facility] SW for follow up at the SNF for support.  CSW signing off.  Lovette ClicheDonna Odessie Polzin, LCSW [phone]

## 2015-05-15 NOTE — Discharge Summary (Signed)
Discharge Summary   Patient ID: [name],  MRN: [id number], DOB/AGE: 01-24-1928 82 y.o.  Admit date: 05/02/2015 Discharge date: 05/15/2015  Primary Care Provider: Thane Edu Primary Cardiologist: Dr. Antoine Poche  Discharge Diagnoses    Acute systolic heart failure (HCC)   Tachy Brady syndrome   Atrial flutter (HCC)   Dyspnea and respiratory abnormalities   Cardiomyopathy   Ankle pain with possible gout   Acute on CKD, Stage IV   DM   CAD s/p CABG   HTN   TIA   Seizure   Carotid stenosis      Allergies Allergies  Allergen Reactions  . Nsaids Other (See Comments)    REACTION: renal insufficiency    Consultant: PCCM  Procedures  Echo 05/02/2015 LV EF: 30% -  35%  ------------------------------------------------------------------- Indications:   Pericardial effusion 423.9.  ------------------------------------------------------------------- History:  PMH:  Myocardial infarction. Risk factors: Hypertension. Diabetes mellitus. Dyslipidemia.  ------------------------------------------------------------------- Study Conclusions  - Left ventricle: The cavity size was normal. There was moderate concentric hypertrophy. Systolic function was moderately to severely reduced. The estimated ejection fraction was in the range of 30% to 35%. Wall motion was normal; there were no regional wall motion abnormalities. - Mitral valve: There was moderate regurgitation. - Left atrium: The atrium was moderately dilated. - Right ventricle: Systolic function was moderately reduced. - Right atrium: The atrium was moderately dilated. - Tricuspid valve: There was moderate regurgitation. - Inferior vena cava: The vessel was dilated. The respirophasic diameter changes were blunted (< 50%), consistent with elevated central venous pressure. - Pericardium, extracardiac: There was no pericardial effusion.  Impressions:  - There is moderate to  severe biventricular dysfunction. The EF estimation might be affected by tachycardia present during the acquisition.  Bilateral Lower Extremity Venous Duplex Evaluation 05/10/2015  Summary:  - No evidence of deep vein thrombosis involving the right lower  extremity. - No evidence of deep vein thrombosis involving the left lower  extremity.  Other specific details can be found in the table(s) above. Prepared and Electronically Authenticated by  History of Present Illness  82yo with hx of CAD s.p CABG, TIA, HTN, DM, seizure, HL and bradycardia who sent by PCP to [facility] ED 05/02/2015 after she was noted to have variation in HR from 30s-80s. PCP reports h/o sick sinus syndrome, previous discussions of pacemaker but sx improved w/o intervention. She went to see PCP for >85month hx of weakness.   She have not seen her since 2010 by cardiologist.  She has had a reduced EF in the past. However, when she was last seen [facility] in 2013 for dizziness she had an EF on echo 55-60%.   She was referred 05/02/15 from her primary care office because of heart rates in the 30s. In the ED the heart rate was in the 120s. She is noted to be in atrial flutter. In the ED she did have an enlarged cardiac shadow on CXR. Echo prelim does not demonstrate an effusion. However, she does have an EF of 30% with global hypokinesis and moderate MR.   She presented with weakness. She could not get out of the bathtub. She has had increasing weakness over weeks and increasing lower extremity edema. She has had some palpitations and one episode of pre syncope a few weeks ago. She reports that at her PCP office she had bradycardia. She has uncontrolled HTN. She has had some progressive DOE and possible PND.   Hospital Course  ATRIAL FLUTTER: This is new. With the  tachybrady rate. CHA2DS2 - VASc score of 9 with a risk of stroke of 15.2%. Started on Eliquis 2.5mg  BID for anticoagulation. Initial plan of TEE>DCCV if  continued to have tachy brady, however rate was improved in Coreg 3.125 and titrated up. Due to ongoing wheezing for few days changed BB to Zebeta 5mg  and titrated up due to elevated rate. No episode of bradycardia. Rate was fairly stable at 80s with transient rate of 100s.   DYSPNEA WITH RESPIRATORY ABNORMALITY: The patient developed wheezing with decreased breath sound. No evidence of pneumonia (no fevers and CXR clear). CT of chest was unrevealing.PCCM stated on Pulmicort and Duoneb. Advised smoking cessation and felt likely has underlying obstructive lung disease will need outpatient evaluation at some point. Cough improved on Mucinex. No abx given during admission.   CARDIOMYOPATHY: EF (30-35%) is lower than previous (55-60% 2013) with moderate MR. She had significant volume overload and diuresed with IV lasix initially and then switched to home dose of Torsemide. Applied compression stocking. LE doppler was negative for DVT. Diuresed 5.4L and 7lb weight loss. Creatine continue to improved.   CAD:No evidence of active ischemia.   HYPERLIPIDEMIA:05/03/2015: Cholesterol 155; HDL 50; LDL Cholesterol 92; Triglycerides 63; VLDL 13. Continue low dose statin.   ACUTE on CKD, stage  IV: Unknown baseline creatinine. On admission Cr of 1.83. Peak of 2.15 after starting home dose of Torsemide initially then continued to improved. At day of discharge Scr of 1.85  DM: Diet controlled. A1C 5.2.   HTN: Due to elevated BP hydralazine added.  BP remained stable and well controlled.   CAROTID STENOSIS: Follow up as an outpatient.  SMOKING: Advised smoking cessation. Education given.   ANKLE PAIN: Unclear etiology. Soft tissue swelling over the ankle on Xray.  No fracture. Uric acid level was increased. Treated for possible gout -colchicine to 0.3mg  daily. Pain resolved. Will continue colchicine until TCM appointment.   She has been seen by Dr. Allyson SabalBerry  today and deemed ready for  discharge home. All follow-up appointments have been scheduled. Discharge medications are listed below.   -Creatinine continued to improved. ARB on hold. Follow renal function (need BMET during TCM).  -Cough and wheezing improved. Continue to Mucinex for now. If fever, need to seen by PCP for possible abx. Continue  Pulmicort and Duoneb. -Ankle pain improved on colchicine. Will continue Colchicine until seen in clinic.  -Rate was fairly stable on Bisoprolol 10mg . DCCV if needed as outpatient.   Discharge Vitals Blood pressure 104/67, pulse 72, temperature 98.4 F (36.9 C), temperature source Oral, resp. rate 20, height 5\' 1"  (1.549 m), weight 198 lb 4.8 oz (89.948 kg), SpO2 91 %.  Filed Weights   05/12/15 0417 05/14/15 0516 05/15/15 0559  Weight: 189 lb 3.2 oz (85.821 kg) 191 lb 14.4 oz (87.045 kg) 198 lb 4.8 oz (89.948 kg)    Labs  CBC  Recent Labs  05/14/15 0508 05/15/15 0402  WBC 3.0* 3.2*  HGB 10.7* 10.8*  HCT 34.6* 34.4*  MCV 100.9* 100.3*  PLT 128* 129*   Basic Metabolic Panel  Recent Labs  05/14/15 0508 05/15/15 0402  NA 139 139  K 4.7 4.5  CL 97* 96*  CO2 35* 35*  GLUCOSE 118* 138*  BUN 91* 96*  CREATININE 1.95* 1.85*  CALCIUM 8.4* 8.5*   Disposition  Pt is being discharged home today in good condition.  Follow-up Plans & Appointments  Follow-up Information    Follow up with HAGER, BRYAN, PA-C. Go on 05/22/2015.  Specialties:  Physician Assistant, Radiology, Interventional Cardiology   Why:  @10 :00 for TCM   Contact information:   [address] [place] [address] [phone]           Discharge Instructions    Diet - low sodium heart healthy    Complete by:  As directed      Increase activity slowly    Complete by:  As directed            F/u Labs/Studies: Bmet during TCM  Discharge Medications    Medication List    STOP taking these medications        losartan 50 MG tablet  Commonly known as:  COZAAR        TAKE these medications        acetaminophen 325 MG tablet  Commonly known as:  TYLENOL  Take 650 mg by mouth every 6 (six) hours as needed for mild pain, moderate pain or headache.     apixaban 2.5 MG Tabs tablet  Commonly known as:  ELIQUIS  Take 1 tablet (2.5 mg total) by mouth 2 (two) times daily.     aspirin EC 81 MG tablet  Take 81 mg by mouth daily.     atorvastatin 20 MG tablet  Commonly known as:  LIPITOR  Take 1 tablet (20 mg total) by mouth daily at 6 PM.     BIOFREEZE 4 % Gel  Generic drug:  Menthol (Topical Analgesic)  Apply 1 application topically 2 (two) times daily as needed (pain).     bisoprolol 10 MG tablet  Commonly known as:  ZEBETA  Take 1 tablet (10 mg total) by mouth daily.     budesonide 0.25 MG/2ML nebulizer solution  Commonly known as:  PULMICORT  Take 2 mLs (0.25 mg total) by nebulization 2 (two) times daily.     cholecalciferol 1000 units tablet  Commonly known as:  VITAMIN D  Take 1,000 Units by mouth daily.     colchicine 0.6 MG tablet  Take 0.5 tablets (0.3 mg total) by mouth daily.     cyclobenzaprine 10 MG tablet  Commonly known as:  FLEXERIL  Take 1 tablet (10 mg total) by mouth 3 (three) times daily as needed for muscle spasms.     eucerin cream  Apply 1 application topically 2 (two) times daily.     gabapentin 300 MG capsule  Commonly known as:  NEURONTIN  Take 300 mg by mouth at bedtime.     guaiFENesin 600 MG 12 hr tablet  Commonly known as:  MUCINEX  Take 1 tablet (600 mg total) by mouth 2 (two) times daily.     hydrALAZINE 10 MG tablet  Commonly known as:  APRESOLINE  Take 1 tablet (10 mg total) by mouth every 8 (eight) hours.     hydroxypropyl methylcellulose / hypromellose 2.5 % ophthalmic solution  Commonly known as:  ISOPTO TEARS / GONIOVISC  Place 1 drop into both eyes 4 (four) times daily.     ipratropium-albuterol 0.5-2.5 (3) MG/3ML Soln  Commonly known as:  DUONEB  Take 3 mLs by nebulization 3 (three) times  daily.     nitroGLYCERIN 0.4 MG SL tablet  Commonly known as:  NITROSTAT  Place 1 tablet (0.4 mg total) under the tongue every 5 (five) minutes x 3 doses as needed for chest pain.     PROVENTIL HFA 108 (90 Base) MCG/ACT inhaler  Generic drug:  albuterol  Inhale 2 puffs into the  lungs every 4 (four) hours as needed for wheezing or shortness of breath.     albuterol (2.5 MG/3ML) 0.083% nebulizer solution  Commonly known as:  PROVENTIL  Take 3 mLs (2.5 mg total) by nebulization every 4 (four) hours as needed for wheezing or shortness of breath.     torsemide 20 MG tablet  Commonly known as:  DEMADEX  Take 20-40 mg by mouth 2 (two) times daily. 40 mg Morning and at 20 mg in the afternoon        Duration of Discharge Encounter   Greater than 30 minutes including physician time.  Signed, Mendy Chou PA-C 05/15/2015, 12:21 PM

## 2015-05-15 NOTE — Progress Notes (Signed)
Patient Name: [name] Date of Encounter: 05/15/2015   SUBJECTIVE  Feeling well. No chest pain, sob or palpitations. Cough improved on Mucinex.  CURRENT MEDS . apixaban  2.5 mg Oral BID  . atorvastatin  20 mg Oral q1800  . bisoprolol  10 mg Oral Daily  . budesonide  0.25 mg Nebulization BID  . cholecalciferol  1,000 Units Oral Daily  . colchicine  0.3 mg Oral Daily  . gabapentin  300 mg Oral QHS  . guaiFENesin  600 mg Oral BID  . hydrALAZINE  10 mg Oral 3 times per day  . hydroxypropyl methylcellulose / hypromellose  1 drop Both Eyes QID  . ipratropium-albuterol  3 mL Nebulization TID  . sodium chloride  3 mL Intravenous Q12H  . torsemide  20 mg Oral QPM  . torsemide  40 mg Oral Daily    OBJECTIVE  Filed Vitals:   05/14/15 2021 05/14/15 2255 05/15/15 0559 05/15/15 0903  BP:  133/79 104/67   Pulse:   72   Temp:   98.4 F (36.9 C)   TempSrc:   Oral   Resp:   20   Height:      Weight:   198 lb 4.8 oz (89.948 kg)   SpO2: 98%  97% 91%    Intake/Output Summary (Last 24 hours) at 05/15/15 1117 Last data filed at 05/15/15 0900  Gross per 24 hour  Intake    720 ml  Output    300 ml  Net    420 ml   Filed Weights   05/12/15 0417 05/14/15 0516 05/15/15 0559  Weight: 189 lb 3.2 oz (85.821 kg) 191 lb 14.4 oz (87.045 kg) 198 lb 4.8 oz (89.948 kg)    PHYSICAL EXAM  General: Pleasant, NAD. Neuro: Alert and oriented X 3. Moves all extremities spontaneously. Psych: Normal affect. HEENT:  Normal  Neck: Supple without bruits or JVD. Lungs:  Resp regular and unlabored. Lungs clear.  Heart: RRR no s3, s4, or murmurs. Abdomen: Soft, non-tender, non-distended, BS + x 4.  Extremities: No clubbing, cyanosis or edema. DP/PT/Radials 2+ and equal bilaterally.  Accessory Clinical Findings  CBC  Recent Labs  05/14/15 0508 05/15/15 0402  WBC 3.0* 3.2*  HGB 10.7* 10.8*  HCT 34.6* 34.4*  MCV 100.9* 100.3*  PLT 128* 129*   Basic Metabolic Panel  Recent Labs  05/14/15 0508 05/15/15 0402  NA 139 139  K 4.7 4.5  CL 97* 96*  CO2 35* 35*  GLUCOSE 118* 138*  BUN 91* 96*  CREATININE 1.95* 1.85*  CALCIUM 8.4* 8.5*    TELE  aflutter  Radiology/Studies  Dg Chest 2 View  05/06/2015  CLINICAL DATA:  Dyspnea, wheezing, cough and decreased breath sounds. History of cardiomyopathy with heart failure. EXAM: CHEST - 2 VIEW COMPARISON:  05/02/2015 FINDINGS: Stable moderate cardiac enlargement. Mild pulmonary venous hypertensive changes remain without evidence of overt airspace edema. On the lateral view there is suggestion of very small bilateral posterior pleural effusions. Scattered scarring and atelectasis present. No focal airspace consolidation. IMPRESSION: Stable cardiomegaly with pulmonary venous hypertension and no overt airspace edema. Small bilateral posterior pleural effusions are present. Electronically Signed   By: Irish Lack M.D.   On: 05/06/2015 11:16   Dg Chest 2 View  05/02/2015  CLINICAL DATA:  82 year old female with hypotension and generalized weakness. EXAM: CHEST  2 VIEW COMPARISON:  Chest x-ray 05/01/2015. FINDINGS: Lung volumes are low. No acute consolidative airspace disease. Small bilateral pleural effusions. Cephalization  of the pulmonary vasculature, with indistinct interstitial markings, suggesting mild interstitial pulmonary edema. Enlargement of the cardiopericardial silhouette which has a "water bottle" appearance, which could suggest an enlarging pericardial effusion. This is similar to the recent prior study, but is clearly new compared to more remote prior examination from 10/07/2011. Upper mediastinal contour is distorted by patient's rotation of the right. Atherosclerosis in the thoracic aorta. Status post median sternotomy for CABG. IMPRESSION: 1. Progressive enlargement of the cardiopericardial silhouette which now has an appearance most concerning for enlarging pericardial effusion. Correlation with echocardiography is  strongly recommended. 2. In addition the cardiac enlargement there is cephalization of the pulmonary vasculature, and evidence of mild interstitial pulmonary edema and small bilateral pleural effusions; findings concerning for congestive heart failure. 3. Atherosclerosis. Electronically Signed   By: Trudie Reed M.D.   On: 05/02/2015 14:46   Dg Chest 2 View  05/01/2015  CLINICAL DATA:  Initial encounter for several day history of shortness of breath. EXAM: CHEST  2 VIEW COMPARISON:  10/07/2011. FINDINGS: AP and lateral views of the chest show hyperexpansion without focal airspace consolidation. No pulmonary edema or pleural effusion. Interstitial markings are diffusely coarsened with chronic features. The cardio pericardial silhouette is enlarged. Patient is status post CABG. Bones are diffusely demineralized. IMPRESSION: Cardiomegaly with hyperexpansion and chronic interstitial lung disease. No acute cardiopulmonary findings. Electronically Signed   By: Kennith Center M.D.   On: 05/01/2015 16:46   Dg Ankle 2 Views Left  05/09/2015  CLINICAL DATA:  Left ankle pain.  No injury. EXAM: LEFT ANKLE - 2 VIEW COMPARISON:  None. FINDINGS: There is moderate generalized soft tissue swelling over the left ankle. Ankle mortise is within normal. There is no acute fracture or dislocation. There are mild degenerate changes over the midfoot and hindfoot. Small to moderate size inferior calcaneal spur present. Mild atherosclerotic disease is present. IMPRESSION: Moderate generalized soft tissue swelling. No acute bony abnormality. Electronically Signed   By: Elberta Fortis M.D.   On: 05/09/2015 14:26   Ct Chest High Resolution  05/10/2015  CLINICAL DATA:  82 year old female with history of dyspnea. Evaluate for interstitial lung disease. EXAM: CT CHEST WITHOUT CONTRAST TECHNIQUE: Multidetector CT imaging of the chest was performed following the standard protocol without intravenous contrast. High resolution imaging of the  lungs, as well as inspiratory and expiratory imaging, was performed. COMPARISON:  No priors. FINDINGS: Mediastinum/Lymph Nodes: Heart size is mildly enlarged. There is no significant pericardial fluid, thickening or pericardial calcification. There is atherosclerosis of the thoracic aorta, the great vessels of the mediastinum and the coronary arteries, including calcified atherosclerotic plaque in the left main, left anterior descending, left circumflex and right coronary arteries. Status post median sternotomy for CABG, including LIMA to the LAD. Mild dilatation of the pulmonic trunk (3.4 cm in diameter). No pathologically enlarged mediastinal or hilar lymph nodes. Please note that accurate exclusion of hilar adenopathy is limited on noncontrast CT scans. Esophagus is unremarkable in appearance. Lungs/Pleura: Small bilateral pleural effusions (right greater than left) with some associated passive atelectasis in the lower lobes of the lungs bilaterally. No acute consolidative airspace disease. No suspicious appearing pulmonary nodules or masses. High-resolution images demonstrate no significant regions of ground-glass attenuation, subpleural reticulation, parenchymal banding or frank honeycombing to suggest interstitial lung disease. There is minimal bronchiectasis and some thick walled cystic change in the medial aspects of the lower lobes of the lungs bilaterally, strongly favored to represent sequela of prior infection. Inspiratory and expiratory imaging is unremarkable.  Very mild centrilobular and paraseptal emphysema. Upper abdomen: Unremarkable. Musculoskeletal: There are no aggressive appearing lytic or blastic lesions noted in the visualized portions of the skeleton. Median sternotomy wires. IMPRESSION: 1. No evidence of interstitial lung disease. 2. There are some areas of mild fibrotic changes in the medial aspects of the lower lobes of the lungs bilaterally with some associated cylindrical bronchiectasis,  strongly favored to represent scarring at sites of of prior infection. 3. Mild diffuse bronchial wall thickening with mild centrilobular and paraseptal emphysema. 4. Small bilateral pleural effusions (right greater than left). 5. Mild cardiomegaly. 6. Dilatation of the pulmonic trunk (3.4 cm in diameter), suggesting pulmonary arterial hypertension. 7. Atherosclerosis, including left main and 3 vessel coronary artery disease. Status post median sternotomy for CABG, including LIMA to the LAD. Electronically Signed   By: Trudie Reedaniel  Entrikin M.D.   On: 05/10/2015 08:18   Dg Chest Port 1 View  05/09/2015  CLINICAL DATA:  SOB, productive cough, and wheezing started last night. NO FEVER. ? Fluid overload. Hx HTN, acute on chronic CHF, A-Flutter, MI, CABG (2008), diabetes, and asthma. EXAM: PORTABLE CHEST 1 VIEW COMPARISON:  05/06/2015 FINDINGS: Cardiomediastinal silhouette is within normal limits. The lungs are clear. There are no focal consolidations or pleural effusions. No evidence for pulmonary edema. IMPRESSION: Cardiomegaly. Electronically Signed   By: Norva PavlovElizabeth  Brown M.D.   On: 05/09/2015 15:34    ASSESSMENT AND PLAN  ATRIAL FLUTTER: Eliquis was started.Rate stable. F/u with Dr. Claudie FishermanHochrine for possible DCCV.   DYSPNEA: Pulmonary evaluation appreciated. CT unrevealing.PCCM stated on Pulmicort and Duoneb. Advised smoking cessation. Cough improved on Mucinex.   CARDIOMYOPATHY: EF 35% with moderate MR. Medical management. Fluid restrict and continue current diuresis. Her edema is much improved but still present. Diuresed 5.4L. Her creatinine improved today to 1.85. Will contiue diuretic and hold ARB. Continue nitrates/hydralazine. Bmet in one week.   ANKLE PAIN: Unclear etiology. Soft tissue swelling over the ankle. No fracture. Uric acid level is increased. Currently treating for possible gout - Per pharmacy will decrease dose of colchicine to 0.3mg  daily. Pain resolved. Will continue  colchicine for now.   CAD: No evidence of active ischemia. CABG  CKD, stage IV: As above. Cr improved.   DM: A1C 5.2.   HTN: BP is stable.    Dispo: [facility]. Vest reading of 38 yesterday.    Signed, Bhagat,Bhavinkumar PA-C  Agree with note by Chelsea AusVin Bhagat PA-C  Clinically improved. No edema. Lungs clear. Cough better. Aflutter with CVR on DOAC. OK for DC to SNF. F/U with Dr. Kara PacerHochrein  Jonathan J. Berry, M.D., FACP, [facility] HospitalFACC, Earl LagosFAHA, [facility] [address] [place], [place]  [address]  [phone] 05/15/2015 1:45 PM

## 2015-05-15 NOTE — Progress Notes (Signed)
Pt's daughter [name] in to see pt this am and requested to speak with Lupita LeashDonna SW prior to pt d/c today.  Notified Lupita LeashDonna and sated she will call daughter.  Amanda PeaNellie Shaneya Taketa, Charity fundraiserN.

## 2015-05-15 NOTE — Progress Notes (Signed)
[facility]Nurse will be receiving pt at the facility.  Amanda PeaNellie Merced Hanners, RN

## 2015-05-15 NOTE — Progress Notes (Signed)
At 1413 pt d/c off floor via stretcher to awaiting transport.  Amanda PeaNellie Zuriel Yeaman, RN

## 2015-05-15 NOTE — [place] FL2 (Signed)
[place] MEDICAID FL2 LEVEL OF CARE SCREENING TOOL     IDENTIFICATION  Patient Name: Kristina MarvelBarbara R Richardson Birthdate: 07/13/1927 Sex: female Admission Date (Current Location): 05/02/2015  [facility] [place]   Facility and Address:  [facility], [address], [place], [place] [address]      Provider Number: [id number]  Attending Physician Name and Address:  Rollene RotundaJames Hochrein, MD  Relative Name and Phone Number:  Elnita MaxwellCheryl, daughter, [phone]    Current Level of Care: Hospital Recommended Level of Care: Skilled Nursing Facility Prior Approval Number:    Date Approved/Denied:   PASRR Number: [id number]  Discharge Plan: SNF    Current Diagnoses: Patient Active Problem List   Diagnosis Date Noted  . Wheezing   . Dyspnea and respiratory abnormalities 05/09/2015  . Wheeze 05/09/2015  . Atrial flutter (HCC) 05/05/2015  . Acute systolic heart failure (HCC) 05/02/2015  . HTN (hypertension) 08/26/2011  . HYPERTENSION, BENIGN ESSENTIAL 03/20/2010  . OSTEOPOROSIS 05/15/2009  . GAIT IMBALANCE 04/02/2009  . DIASTOLIC HEART FAILURE, CHRONIC 03/04/2009  . FATIGUE / MALAISE 03/04/2009  . DIVERTICULAR BLEEDING, HX OF 10/11/2008  . PURE HYPERCHOLESTEROLEMIA 09/12/2008  . CAROTID ARTERY STENOSIS 06/05/2008  . CHRONIC COMB SYSTOLIC&DIASTOLIC HEART FAILURE 03/25/2008  . LOW BACK PAIN SYNDROME 03/12/2008  . CORONARY ARTERY DISEASE 04/29/2007  . DIABETES MELLITUS, TYPE II 12/21/2006  . DYSLIPIDEMIA 12/21/2006  . OBESITY NOS 12/21/2006  . ANEMIA, NORMOCYTIC, CHRONIC 12/21/2006  . MYOCARDIAL INFARCTION 12/21/2006  . ASTHMA 12/21/2006  . DIVERTICULOSIS, COLON 12/21/2006  . RENAL INSUFFICIENCY, CHRONIC 12/21/2006  . DEPENDENT EDEMA, LEGS, BILATERAL 12/21/2006  . CARDIAC CATHETERIZATION, LEFT, HX OF 08/27/2006  . LOSS, SENSORY HEARING, BILATERAL 01/04/2003    Orientation RESPIRATION BLADDER Height & Weight    Self, Time, Situation, Place  Normal Incontinent 5\' 1"  (154.9 cm) 187 lbs.  BEHAVIORAL SYMPTOMS/MOOD NEUROLOGICAL BOWEL NUTRITION STATUS   (N/A)  (N/A) Continent  (Please see dc summary)  AMBULATORY STATUS COMMUNICATION OF NEEDS Skin   Extensive Assist Verbally Normal                       Personal Care Assistance Level of Assistance  Bathing, Feeding, Dressing Bathing Assistance: Maximum assistance Feeding assistance: Limited assistance Dressing Assistance: Maximum assistance     Functional Limitations Info  Hearing   Hearing Info: Impaired      SPECIAL CARE FACTORS FREQUENCY  PT (By licensed PT)    PT:  5X week  OT:  5 X week          Contractures      Additional Factors Info  Code Status, Allergies:  Full Code Allergies:  NSAIDS Code Status Info: Full Allergies Info: Nsaids           Current Medications (05/15/2015):  This is the current hospital active medication list Current Facility-Administered Medications  Medication Dose Route Frequency Provider Last Rate Last Dose  . 0.9 %  sodium chloride infusion  250 mL Intravenous PRN Amber Caryl BisK Seiler, NP      . acetaminophen (TYLENOL) tablet 650 mg  650 mg Oral Q6H PRN Marily LenteAmber K Seiler, NP      . albuterol (PROVENTIL) (2.5 MG/3ML) 0.083% nebulizer solution 2.5 mg  2.5 mg Nebulization Q4H PRN Rollene RotundaJames Hochrein, MD   2.5 mg at 05/09/15 0902  . apixaban (ELIQUIS) tablet 2.5 mg  2.5 mg Oral BID Rollene RotundaJames Hochrein, MD   2.5 mg at 05/15/15 1120  . atorvastatin (LIPITOR)  tablet 20 mg  20 mg Oral q1800 Marily Lente, NP   20 mg at 05/14/15 1708  . bisoprolol (ZEBETA) tablet 10 mg  10 mg Oral Daily Bhavinkumar Bhagat, PA   10 mg at 05/15/15 1119  . budesonide (PULMICORT) nebulizer solution 0.25 mg  0.25 mg Nebulization BID Kalman Shan, MD   0.25 mg at 05/15/15 0902  . cholecalciferol (VITAMIN D) tablet 1,000 Units  1,000 Units Oral Daily Marily Lente, NP   1,000 Units at 05/15/15 1120  . colchicine tablet 0.3 mg  0.3 mg Oral Daily Quintella Reichert, MD    0.3 mg at 05/15/15 1119  . gabapentin (NEURONTIN) capsule 300 mg  300 mg Oral QHS Marily Lente, NP   300 mg at 05/14/15 2257  . guaiFENesin (MUCINEX) 12 hr tablet 600 mg  600 mg Oral BID Bhavinkumar Bhagat, PA   600 mg at 05/15/15 1119  . guaiFENesin-dextromethorphan (ROBITUSSIN DM) 100-10 MG/5ML syrup 5 mL  5 mL Oral Q4H PRN [name], MD   5 mL at 05/14/15 2254  . hydrALAZINE (APRESOLINE) tablet 10 mg  10 mg Oral 3 times per day [name], MD   10 mg at 05/15/15 1610  . hydroxypropyl methylcellulose / hypromellose (ISOPTO TEARS / GONIOVISC) 2.5 % ophthalmic solution 1 drop  1 drop Both Eyes QID Marily Lente, NP   1 drop at 05/13/15 1512  . ipratropium-albuterol (DUONEB) 0.5-2.5 (3) MG/3ML nebulizer solution 3 mL  3 mL Nebulization TID [name], MD   3 mL at 05/15/15 0902  . nitroGLYCERIN (NITROSTAT) SL tablet 0.4 mg  0.4 mg Sublingual Q5 Min x 3 PRN Amber Caryl Bis, NP      . ondansetron (ZOFRAN) injection 4 mg  4 mg Intravenous Q6H PRN Amber Caryl Bis, NP      . sodium chloride 0.9 % injection 3 mL  3 mL Intravenous Q12H Amber Caryl Bis, NP   3 mL at 05/15/15 1000  . sodium chloride 0.9 % injection 3 mL  3 mL Intravenous PRN Marily Lente, NP   3 mL at 05/10/15 1037  . torsemide (DEMADEX) tablet 20 mg  20 mg Oral QPM Jake Bathe, MD   20 mg at 05/14/15 1708  . torsemide (DEMADEX) tablet 40 mg  40 mg Oral Daily Jake Bathe, MD   40 mg at 05/15/15 1118     Discharge Medications: Please see discharge summary for a list of discharge medications.  Relevant Imaging Results:  Relevant Lab Results:   Additional Information SSN: [id number]  Darylene Price, LCSW

## 2015-05-15 NOTE — Progress Notes (Signed)
Physical Therapy Treatment Patient Details Name: Kristina MarvelBarbara R Jakubiak MRN: [id number] DOB: 03/29/1928 Today's Date: 05/15/2015    History of Present Illness 82 yo female with onset of CHF and pleural effusion, admitted for tachy brady syndrom was referred to PT for evaluation of gait.   Pt has history of cardiomyopathy, CHF, CABG, CAD    PT Comments    Patient continues with weakness and decreased safety awareness.  Agitated today and not wanting to go to SNF.  Attempted to encourage that she would benefit from skilled care prior to d/c home alone.  Social worker informed.    Follow Up Recommendations  SNF     Equipment Recommendations  None recommended by PT    Recommendations for Other Services       Precautions / Restrictions Precautions Precautions: Fall Restrictions Other Position/Activity Restrictions: cleared for DVT and L ankle fracture    Mobility  Bed Mobility               General bed mobility comments: NT left EOB with nurse in room  Transfers   Equipment used: Rolling walker (2 wheeled) Transfers: Sit to/from Stand Sit to Stand: Min guard            Ambulation/Gait Ambulation/Gait assistance: Min guard Ambulation Distance (Feet): 150 Feet Assistive device: Rolling walker (2 wheeled) Gait Pattern/deviations: Antalgic;Trunk flexed;Wide base of support;Decreased stance time - left;Decreased step length - right     General Gait Details: wore her shoes with ambulation, but still L ankle pain > right   Stairs            Wheelchair Mobility    Modified Rankin (Stroke Patients Only)       Balance Overall balance assessment: Needs assistance   Sitting balance-Leahy Scale: Fair     Standing balance support: Bilateral upper extremity supported Standing balance-Leahy Scale: Poor Standing balance comment: in room on her feet when I entered attempting to move B/S table and [facility], assist for safety, pt flexed and reaching for UE  support                    Cognition Arousal/Alertness: Awake/alert Behavior During Therapy: Agitated Overall Cognitive Status: No family/caregiver present to determine baseline cognitive functioning                      Exercises      General Comments        Pertinent Vitals/Pain Faces Pain Scale: Hurts little more Pain Location: L lateral malleolus and anterior aspect of ankle Pain Descriptors / Indicators: Sore Pain Intervention(s): Monitored during session;Limited activity within patient's tolerance    Home Living                      Prior Function            PT Goals (current goals can now be found in the care plan section) Progress towards PT goals: Progressing toward goals    Frequency  Min 2X/week    PT Plan Current plan remains appropriate    Co-evaluation             End of Session Equipment Utilized During Treatment: Gait belt Activity Tolerance: Patient limited by fatigue Patient left: in bed;with nursing/sitter in room     Time: 1315-1340 PT Time Calculation (min) (ACUTE ONLY): 25 min  Charges:  $Gait Training: 8-22 mins $Therapeutic Activity: 8-22 mins  G CodesElray Mcgregor 05/15/2015, 1:59 PM  Sheran Lawless, [place] [phone] 05/15/2015

## 2015-05-15 NOTE — Telephone Encounter (Signed)
Please call 1/12. Discharge date of 1/11.

## 2015-05-15 NOTE — Telephone Encounter (Signed)
New message    TCM appt on  1.18.2017 @ 10 am per Vin PA.

## 2015-05-15 NOTE — Clinical Social Work Placement (Signed)
   CLINICAL SOCIAL WORK PLACEMENT  NOTE  Date:  05/15/2015  Patient Details  Name: Kristina MarvelBarbara R Lebleu MRN: [id number] Date of Birth: 03/05/1928  Clinical Social Work is seeking post-discharge placement for this patient at the Skilled  Nursing Facility level of care (*CSW will initial, date and re-position this form in  chart as items are completed):  No   Patient/family provided with [facility] Clinical Social Work Department's list of facilities offering this level of care within the geographic area requested by the patient (or if unable, by the patient's family).  No   Patient/family informed of their freedom to choose among providers that offer the needed level of care, that participate in Medicare, Medicaid or managed care program needed by the patient, have an available bed and are willing to accept the patient.  No   Patient/family informed of [facility]'s ownership interest in [facility] and [facility], as well as of the fact that they are under no obligation to receive care at these facilities.  PASRR submitted to EDS on 05/08/15     PASRR number received on 05/08/15     Existing PASRR number confirmed on       FL2 transmitted to all facilities in geographic area requested by pt/family on 05/08/15     FL2 transmitted to all facilities within larger geographic area on       Patient informed that his/her managed care company has contracts with or will negotiate with certain facilities, including the following:   ([facility])     Yes   Patient/family informed of bed offers received.  [facility]     Physician recommends and patient chooses bed at      Patient to be [facility] on 05/15/15.  Patient to be transferred to facility by Ambulance Sharin Mons(PTAR)     Patient family notified on 05/15/15 of transfer.  Name of family member notified:  Daughter- Berniece PapCheryl Fowlder     PHYSICIAN Please  prepare priority discharge summary, including medications, Please sign FL2, Please prepare prescriptions     Additional Comment:    _______________________________________________ Darylene Pricerowder, Raima Geathers T, LCSW 05/15/2015, 1:17 PM

## 2015-05-16 NOTE — Telephone Encounter (Signed)
Phone number of record called for TCM call. Message after dialing number says this number is not currently in service.  No message option

## 2015-05-17 NOTE — Telephone Encounter (Signed)
Called phone number on record.  # not in service 1/13 mj

## 2015-05-20 NOTE — Telephone Encounter (Signed)
Message on phone # of record states that the phone is not set up with voice mail

## 2015-05-22 ENCOUNTER — Ambulatory Visit (INDEPENDENT_AMBULATORY_CARE_PROVIDER_SITE_OTHER): Payer: Medicare (Managed Care) | Admitting: Physician Assistant

## 2015-05-22 ENCOUNTER — Encounter: Payer: Self-pay | Admitting: Physician Assistant

## 2015-05-22 VITALS — BP 120/70 | HR 88 | Ht 61.0 in | Wt 202.0 lb

## 2015-05-22 DIAGNOSIS — E785 Hyperlipidemia, unspecified: Secondary | ICD-10-CM

## 2015-05-22 DIAGNOSIS — Z79899 Other long term (current) drug therapy: Secondary | ICD-10-CM

## 2015-05-22 DIAGNOSIS — I5032 Chronic diastolic (congestive) heart failure: Secondary | ICD-10-CM

## 2015-05-22 DIAGNOSIS — I10 Essential (primary) hypertension: Secondary | ICD-10-CM | POA: Diagnosis not present

## 2015-05-22 DIAGNOSIS — N189 Chronic kidney disease, unspecified: Secondary | ICD-10-CM

## 2015-05-22 DIAGNOSIS — I5043 Acute on chronic combined systolic (congestive) and diastolic (congestive) heart failure: Secondary | ICD-10-CM

## 2015-05-22 LAB — BASIC METABOLIC PANEL
BUN: 95 mg/dL — ABNORMAL HIGH (ref 7–25)
CALCIUM: 9 mg/dL (ref 8.6–10.4)
CO2: 35 mmol/L — AB (ref 20–31)
CREATININE: 2.15 mg/dL — AB (ref 0.60–0.88)
Chloride: 100 mmol/L (ref 98–110)
GLUCOSE: 108 mg/dL — AB (ref 65–99)
Potassium: 4.5 mmol/L (ref 3.5–5.3)
Sodium: 142 mmol/L (ref 135–146)

## 2015-05-22 NOTE — Patient Instructions (Addendum)
Weigh daily Call [phone] if weight climbs more than 3 pounds in a day or 5 pounds in a week.  No more than 2000 mg in a day. Call if increased shortness of breath or increased swelling.    Your physician recommends that you return for lab work TODAY DOWNSTAIRS.  Your physician has recommended you make the following change in your medication:   Increase the torsemide to 60 mg in the morning and 40 mg in the evening.  You have been referred for physical therapy assessment to see if you can be discharged to return home.  Your physician recommends that you keep your 06/03/15 appointment with Dr Antoine Poche.

## 2015-05-22 NOTE — Progress Notes (Signed)
Patient ID: [name], female   DOB: 1927-11-05, 82 y.o.   MRN: [id number]    Date:  05/22/2015   [name], DOB December 12, 1927, MRN [id number]  PCP:  Thane Edu, MD  Primary Cardiologist:  [facility]  Chief Complaint  Patient presents with  . Follow-up    no chest pain, some shortness of breath, Swelling in ankles, no cramping, dizziness     History of Present Illness:  [name] is a 82 y.o. female 82yo with hx of CAD s.p CABG, TIA, HTN, DM, peripheral vascular disease with carotid artery stenosis, seizure, HL, chronic kidney disease stage IV and bradycardia.  Patient was admitted to [facility] from 05/02/2015 to  05/15/2015 with acute systolic heart failure, tachybradycardia syndrome, atrial flutter.  2-D echocardiogram revealed ejection fraction of 3035% is moderate concentric LVH, moderate MR, left atrium was moderately dilated, right ventricle systolic function was moderately reduced, right atrium was moderately dilated and there was moderate tricuspid regurgitation.  She had lower extremity venous Dopplers which were negative for DVT.  Atrial flutter was treated with Eliquis 2.5 mg(ChADSVASC 9) twice daily and bisoprolol 10 mg and heart rate stabilized into the 80s. She was significantly volume overloaded and diuresed.  She was discharged on torsemide.  Her discharge weight was 198 pounds.  ARB was held due to chronic kidney disease.   [name] presents for posthospital follow-up.   Patient reports her shortness of breath is about the same. She did sleep very well on Saturday night without any paroxysmal nocturnal dyspnea. She does still have considerable lower extremity edema. This was to be eating a low-sodium diet however, her daughter reports that the [facility] nursing facility is not giving her low-sodium. She is eating pork and bacon. They're not weighing her every day. The patient currently denies nausea, vomiting, fever, chest pain,  orthopnea, dizziness, cough, congestion, abdominal pain, hematochezia, melena..  Wt Readings from Last 3 Encounters:  05/22/15 202 lb (91.627 kg)  05/15/15 198 lb 4.8 oz (89.948 kg)  10/07/11 208 lb (94.348 kg)     Past Medical History  Diagnosis Date  . Hypertension   . Asthma   . Stroke [facility])     TIA    . Arthritis   . Bradycardia   . Hyperlipidemia   . MI (myocardial infarction) (HCC) 2009  . NSTEMI (non-ST elevated myocardial infarction) (HCC) 08/27/2006    Hattie Perch 09/03/2010  . Type II diabetes mellitus (HCC)     Diet controlled  . CHF (congestive heart failure) (HCC)     Hattie Perch 09/03/2010  . Chronic renal insufficiency     Hattie Perch 09/03/2010  . Coronary artery disease     Current Outpatient Prescriptions  Medication Sig Dispense Refill  . acetaminophen (TYLENOL) 325 MG tablet Take 650 mg by mouth every 6 (six) hours as needed for mild pain, moderate pain or headache.    . albuterol (PROVENTIL) (2.5 MG/3ML) 0.083% nebulizer solution Take 3 mLs (2.5 mg total) by nebulization every 4 (four) hours as needed for wheezing or shortness of breath. 75 mL 12  . apixaban (ELIQUIS) 2.5 MG TABS tablet Take 1 tablet (2.5 mg total) by mouth 2 (two) times daily. 60 tablet 11  . aspirin EC 81 MG tablet Take 81 mg by mouth daily.    [name] atorvastatin (LIPITOR) 20 MG tablet Take 1 tablet (20 mg total) by mouth daily at 6 PM. 30 tablet 6  . bisoprolol (ZEBETA) 10 MG tablet Take  1 tablet (10 mg total) by mouth daily. 30 tablet 3  . budesonide (PULMICORT) 0.25 MG/2ML nebulizer solution Take 2 mLs (0.25 mg total) by nebulization 2 (two) times daily. 60 mL 1  . cholecalciferol (VITAMIN D) 1000 units tablet Take 1,000 Units by mouth daily.    . colchicine 0.6 MG tablet Take 0.5 tablets (0.3 mg total) by mouth daily. 30 tablet 0  . cyclobenzaprine (FLEXERIL) 10 MG tablet Take 1 tablet (10 mg total) by mouth 3 (three) times daily as needed for muscle spasms. (Patient not taking: Reported on 05/02/2015) 20  tablet 0  . gabapentin (NEURONTIN) 300 MG capsule Take 300 mg by mouth at bedtime.    [name] guaiFENesin (MUCINEX) 600 MG 12 hr tablet Take 1 tablet (600 mg total) by mouth 2 (two) times daily. 30 tablet 0  . hydrALAZINE (APRESOLINE) 10 MG tablet Take 1 tablet (10 mg total) by mouth every 8 (eight) hours. 90 tablet 3  . hydroxypropyl methylcellulose / hypromellose (ISOPTO TEARS / GONIOVISC) 2.5 % ophthalmic solution Place 1 drop into both eyes 4 (four) times daily.    [name] ipratropium-albuterol (DUONEB) 0.5-2.5 (3) MG/3ML SOLN Take 3 mLs by nebulization 3 (three) times daily. 360 mL 0  . Menthol, Topical Analgesic, (BIOFREEZE) 4 % GEL Apply 1 application topically 2 (two) times daily as needed (pain).    . nitroGLYCERIN (NITROSTAT) 0.4 MG SL tablet Place 1 tablet (0.4 mg total) under the tongue every 5 (five) minutes x 3 doses as needed for chest pain. 25 tablet 1  . PROVENTIL HFA 108 (90 Base) MCG/ACT inhaler Inhale 2 puffs into the lungs every 4 (four) hours as needed for wheezing or shortness of breath.   4  . Skin Protectants, Misc. (EUCERIN) cream Apply 1 application topically 2 (two) times daily.    [name] torsemide (DEMADEX) 20 MG tablet Take 20-40 mg by mouth as directed.     No current facility-administered medications for this visit.    Allergies:    Allergies  Allergen Reactions  . Nsaids Other (See Comments)    REACTION: renal insufficiency    Social History:  The patient  reports that she quit smoking about 30 years ago. Her smoking use included Cigarettes. She has a 4.95 pack-year smoking history. She has never used smokeless tobacco. She reports that she does not drink alcohol or use illicit drugs.   Family history:   Family History  Problem Relation Age of Onset  . Heart disease Father     ROS:  Please see the history of present illness.  All other systems reviewed and negative.   PHYSICAL EXAM: VS:  BP 120/70 mmHg  Pulse 88  Ht  (1.549 m)  Wt 202 lb (91.627 kg)  BMI  38.19 kg/m2 Obese, well developed, in no acute distress HEENT: Pupils are equal round react to light accommodation extraocular movements are intact.  Neck: Elevated JVD Cardiac: Regular rate and rhythm without murmurs rubs or gallops. Lungs:  clear to auscultation bilaterally, no wheezing, rhonchi or rales Abd: soft, nontender, positive bowel sounds all quadrants, no hepatosplenomegaly Ext: 2-3+ pitting lower extremity edema.  2+ radial and dorsalis pedis pulses. Skin: warm and dry Neuro:  Grossly normal     ASSESSMENT AND PLAN:  Problem List Items Addressed This Visit    RENAL INSUFFICIENCY, CHRONIC   HTN (hypertension) (Chronic)   Dyslipidemia   RESOLVED: DIASTOLIC HEART FAILURE, CHRONIC - Primary   Relevant Orders   Brain natriuretic peptide   Ambulatory  referral to Physical Therapy   Acute on chronic combined systolic and diastolic heart failure, NYHA class 3 (HCC)    Other Visit Diagnoses    Polypharmacy        Relevant Orders    Basic metabolic panel      Patient still appears volume overloaded. Her weight is up 4 pounds from the previous weight. I'll increase her torsemide from 40 mg every morning and 20 mg every afternoon to 60 and 40, I sent orders to [facility] to weigh her daily and provide her with a low-sodium diet.  They are also to get a PT evaluation to see if she can take care of herself at home. We'll also check a basic metabolic panel and brain naturetic peptide.  However follow-up next week and see how she is progressing.

## 2015-05-23 ENCOUNTER — Telehealth: Payer: Self-pay | Admitting: *Deleted

## 2015-05-23 LAB — BRAIN NATRIURETIC PEPTIDE: Brain Natriuretic Peptide: 463.7 pg/mL — ABNORMAL HIGH (ref 0.0–100.0)

## 2015-05-23 NOTE — Telephone Encounter (Signed)
Called [facility], spoke with [place], and advised her to d/c Colchicine, weight pt daily, and to feed her low sodium diet.  She verbalized understanding.

## 2015-05-30 ENCOUNTER — Emergency Department (HOSPITAL_COMMUNITY): Payer: Medicare (Managed Care)

## 2015-05-30 ENCOUNTER — Encounter (HOSPITAL_COMMUNITY): Payer: Self-pay | Admitting: Emergency Medicine

## 2015-05-30 ENCOUNTER — Emergency Department (HOSPITAL_COMMUNITY)
Admission: EM | Admit: 2015-05-30 | Discharge: 2015-05-31 | Disposition: A | Discharge status: Home / Self Care | Payer: Medicare (Managed Care) | Attending: Emergency Medicine | Admitting: Emergency Medicine

## 2015-05-30 DIAGNOSIS — Z87891 Personal history of nicotine dependence: Secondary | ICD-10-CM | POA: Diagnosis not present

## 2015-05-30 DIAGNOSIS — M1990 Unspecified osteoarthritis, unspecified site: Secondary | ICD-10-CM | POA: Diagnosis not present

## 2015-05-30 DIAGNOSIS — I129 Hypertensive chronic kidney disease with stage 1 through stage 4 chronic kidney disease, or unspecified chronic kidney disease: Secondary | ICD-10-CM | POA: Insufficient documentation

## 2015-05-30 DIAGNOSIS — I252 Old myocardial infarction: Secondary | ICD-10-CM | POA: Insufficient documentation

## 2015-05-30 DIAGNOSIS — Z7902 Long term (current) use of antithrombotics/antiplatelets: Secondary | ICD-10-CM | POA: Insufficient documentation

## 2015-05-30 DIAGNOSIS — E119 Type 2 diabetes mellitus without complications: Secondary | ICD-10-CM | POA: Diagnosis not present

## 2015-05-30 DIAGNOSIS — I2510 Atherosclerotic heart disease of native coronary artery without angina pectoris: Secondary | ICD-10-CM | POA: Insufficient documentation

## 2015-05-30 DIAGNOSIS — Z7982 Long term (current) use of aspirin: Secondary | ICD-10-CM | POA: Insufficient documentation

## 2015-05-30 DIAGNOSIS — N189 Chronic kidney disease, unspecified: Secondary | ICD-10-CM | POA: Diagnosis not present

## 2015-05-30 DIAGNOSIS — Z951 Presence of aortocoronary bypass graft: Secondary | ICD-10-CM | POA: Insufficient documentation

## 2015-05-30 DIAGNOSIS — Z8673 Personal history of transient ischemic attack (TIA), and cerebral infarction without residual deficits: Secondary | ICD-10-CM | POA: Insufficient documentation

## 2015-05-30 DIAGNOSIS — Z79899 Other long term (current) drug therapy: Secondary | ICD-10-CM | POA: Insufficient documentation

## 2015-05-30 DIAGNOSIS — J45901 Unspecified asthma with (acute) exacerbation: Secondary | ICD-10-CM | POA: Insufficient documentation

## 2015-05-30 DIAGNOSIS — E785 Hyperlipidemia, unspecified: Secondary | ICD-10-CM | POA: Diagnosis not present

## 2015-05-30 DIAGNOSIS — Z9889 Other specified postprocedural states: Secondary | ICD-10-CM | POA: Insufficient documentation

## 2015-05-30 DIAGNOSIS — I5043 Acute on chronic combined systolic (congestive) and diastolic (congestive) heart failure: Secondary | ICD-10-CM | POA: Diagnosis not present

## 2015-05-30 DIAGNOSIS — R0602 Shortness of breath: Secondary | ICD-10-CM | POA: Diagnosis present

## 2015-05-30 DIAGNOSIS — Z9861 Coronary angioplasty status: Secondary | ICD-10-CM | POA: Diagnosis not present

## 2015-05-30 LAB — BASIC METABOLIC PANEL
ANION GAP: 10 (ref 5–15)
BUN: 77 mg/dL — AB (ref 6–20)
CO2: 31 mmol/L (ref 22–32)
Calcium: 8.6 mg/dL — ABNORMAL LOW (ref 8.9–10.3)
Chloride: 102 mmol/L (ref 101–111)
Creatinine, Ser: 2.75 mg/dL — ABNORMAL HIGH (ref 0.44–1.00)
GFR calc Af Amer: 17 mL/min — ABNORMAL LOW (ref >60)
GFR calc non Af Amer: 15 mL/min — ABNORMAL LOW (ref >60)
GLUCOSE: 88 mg/dL (ref 65–99)
POTASSIUM: 5.4 mmol/L — AB (ref 3.5–5.1)
Sodium: 143 mmol/L (ref 135–145)

## 2015-05-30 LAB — BLOOD GAS, ARTERIAL
ACID-BASE EXCESS: 3.9 mmol/L — AB (ref 0.0–2.0)
Bicarbonate: 30.6 mEq/L — ABNORMAL HIGH (ref 20.0–24.0)
Drawn by: 235321
O2 CONTENT: 2 L/min
O2 SAT: 95.7 %
PATIENT TEMPERATURE: 98.6
PO2 ART: 88.6 mmHg (ref 80.0–100.0)
TCO2: 28.4 mmol/L (ref 0–100)
pCO2 arterial: 59.9 mmHg (ref 35.0–45.0)
pH, Arterial: 7.329 — ABNORMAL LOW (ref 7.350–7.450)

## 2015-05-30 LAB — CBC
HEMATOCRIT: 36.7 % (ref 36.0–46.0)
HEMOGLOBIN: 11.1 g/dL — AB (ref 12.0–15.0)
MCH: 31.2 pg (ref 26.0–34.0)
MCHC: 30.2 g/dL (ref 30.0–36.0)
MCV: 103.1 fL — AB (ref 78.0–100.0)
Platelets: 104 10*3/uL — ABNORMAL LOW (ref 150–400)
RBC: 3.56 MIL/uL — AB (ref 3.87–5.11)
RDW: 14.4 % (ref 11.5–15.5)
WBC: 3.5 10*3/uL — ABNORMAL LOW (ref 4.0–10.5)

## 2015-05-30 LAB — BRAIN NATRIURETIC PEPTIDE: B Natriuretic Peptide: 484.1 pg/mL — ABNORMAL HIGH (ref 0.0–100.0)

## 2015-05-30 LAB — TROPONIN I: Troponin I: 0.05 ng/mL — ABNORMAL HIGH (ref <0.031)

## 2015-05-30 MED ORDER — FUROSEMIDE 40 MG PO TABS
40.0000 mg | ORAL_TABLET | Freq: Once | ORAL | Status: AC
  Administered 2015-05-31: 40 mg via ORAL
  Filled 2015-05-30: qty 1

## 2015-05-30 MED ORDER — FUROSEMIDE 40 MG PO TABS: 40.0000 mg | ORAL_TABLET | Freq: Two times a day (BID) | ORAL | Status: DC

## 2015-05-30 NOTE — ED Notes (Signed)
[facility] rehab was contacted. Pt was previously a resident there, but was discharged. Pt's daughter was called. She did not answer and her voicemail box was full.

## 2015-05-30 NOTE — ED Provider Notes (Signed)
CSN: [id number]     Arrival date & time 05/30/15  1638 History   First MD Initiated Contact with Patient 05/30/15 1710     Chief Complaint  Patient presents with  . Shortness of Breath     HPI Patient presents to the emergency department [facility] [place] [facility].  She was there and it sounds as though EMS was called for some shortness of breath and increasing lower extreme edema.  Patient has a history of combined systolic/diastolic congestive heart failure.  She reports compliance with her medications.  She was recently seen in the cardiology clinic and her Lasix was increased 6-7 days ago.  She denies significant orthopnea.  EMS reported wheezing and the patient states she feels better after breathing treatment.  She was also given IV Solu-Medrol.  The patient was given [facility].  She reports no history of PE or DVT.  She denies unilateral leg swelling.  She reports mild bilateral leg swelling.   Past Medical History  Diagnosis Date  . Hypertension   . Asthma   . Stroke [facility])     TIA    . Arthritis   . Bradycardia   . Hyperlipidemia   . MI (myocardial infarction) (HCC) 2009  . NSTEMI (non-ST elevated myocardial infarction) (HCC) 08/27/2006    Hattie Perch 09/03/2010  . Type II diabetes mellitus (HCC)     Diet controlled  . CHF (congestive heart failure) (HCC)     Hattie Perch 09/03/2010  . Chronic renal insufficiency     Hattie Perch 09/03/2010  . Coronary artery disease    Past Surgical History  Procedure Laterality Date  . Eye surgery      Glaucoma  . Coronary artery bypass graft  03/18/2007    Dr.Gerhardt LIMA to the LAD, SVG to distal circ and SVG to RCA/notes 09/02/2010  . Cataract extraction w/ intraocular lens implant Left 10/17/2007    Hattie Perch 09/03/2010  . Coronary angioplasty with stent placement  03/18/2007    3 stents/notes 09/03/2010  . Cardiac catheterization  12/23/2006    Hattie Perch 09/04/2010  . Cardiac catheterization  03/16/2007    Hattie Perch 09/06/2010   Family History  Problem  Relation Age of Onset  . Heart disease Father    Social History  Substance Use Topics  . Smoking status: Former Smoker -- 0.33 packs/day for 15 years    Types: Cigarettes    Quit date: 05/21/1985  . Smokeless tobacco: Never Used  . Alcohol Use: No   OB History    No data available     Review of Systems  All other systems reviewed and are negative.     Allergies  Nsaids  Home Medications   Prior to Admission medications   Medication Sig Start Date End Date Taking? Authorizing Provider  acetaminophen (TYLENOL) 325 MG tablet Take 650 mg by mouth every 6 (six) hours as needed for mild pain, moderate pain or headache.   Yes Historical Provider, MD  albuterol (PROVENTIL) (2.5 MG/3ML) 0.083% nebulizer solution Take 3 mLs (2.5 mg total) by nebulization every 4 (four) hours as needed for wheezing or shortness of breath. 05/15/15  Yes Bhavinkumar Bhagat, PA  amLODipine (NORVASC) 10 MG tablet Take 10 mg by mouth daily.   Yes Historical Provider, MD  apixaban (ELIQUIS) 2.5 MG TABS tablet Take 1 tablet (2.5 mg total) by mouth 2 (two) times daily. 05/15/15  Yes Bhavinkumar Bhagat, PA  aspirin EC 81 MG tablet Take 81 mg by mouth daily.  Yes Historical Provider, MD  atorvastatin (LIPITOR) 20 MG tablet Take 1 tablet (20 mg total) by mouth daily at 6 PM. 05/15/15  Yes Bhavinkumar Bhagat, PA  bisoprolol (ZEBETA) 10 MG tablet Take 1 tablet (10 mg total) by mouth daily. 05/15/15  Yes Bhavinkumar Bhagat, PA  budesonide (PULMICORT) 0.25 MG/2ML nebulizer solution Take 2 mLs (0.25 mg total) by nebulization 2 (two) times daily. 05/15/15  Yes Bhavinkumar Bhagat, PA  cholecalciferol (VITAMIN D) 1000 units tablet Take 1,000 Units by mouth daily.   Yes Historical Provider, MD  furosemide (LASIX) 10 MG/ML solution Inject 20 mg into the muscle daily.   Yes Historical Provider, MD  guaiFENesin (MUCINEX) 600 MG 12 hr tablet Take 1 tablet (600 mg total) by mouth 2 (two) times daily. 05/15/15  Yes Bhavinkumar Bhagat,  PA  hydrALAZINE (APRESOLINE) 10 MG tablet Take 1 tablet (10 mg total) by mouth every 8 (eight) hours. 05/15/15  Yes Bhavinkumar Bhagat, PA  hydroxypropyl methylcellulose / hypromellose (ISOPTO TEARS / GONIOVISC) 2.5 % ophthalmic solution Place 1 drop into both eyes 4 (four) times daily as needed for dry eyes.    Yes Historical Provider, MD  ipratropium-albuterol (DUONEB) 0.5-2.5 (3) MG/3ML SOLN Take 3 mLs by nebulization 3 (three) times daily. 05/15/15  Yes Bhavinkumar Bhagat, PA  Menthol, Topical Analgesic, (BIOFREEZE) 4 % GEL Apply 1 application topically 2 (two) times daily as needed (pain).   Yes Historical Provider, MD  nitroGLYCERIN (NITROSTAT) 0.4 MG SL tablet Place 1 tablet (0.4 mg total) under the tongue every 5 (five) minutes x 3 doses as needed for chest pain. 05/15/15  Yes Bhavinkumar Bhagat, PA  PROVENTIL HFA 108 (90 Base) MCG/ACT inhaler Inhale 2 puffs into the lungs every 4 (four) hours as needed for wheezing or shortness of breath.  04/08/15  Yes Historical Provider, MD  Skin Protectants, Misc. (EUCERIN) cream Apply 1 application topically 2 (two) times daily.   Yes Historical Provider, MD  torsemide (DEMADEX) 20 MG tablet Take 20-40 mg by mouth 2 (two) times daily. 3 tabs (60 mg) in the am and 2 tabs (40 mg) in the early afternoon.   Yes Historical Provider, MD  cyclobenzaprine (FLEXERIL) 10 MG tablet Take 1 tablet (10 mg total) by mouth 3 (three) times daily as needed for muscle spasms. Patient not taking: Reported on 05/02/2015 11/28/12   Geoffery Lyons, MD  gabapentin (NEURONTIN) 300 MG capsule Take 300 mg by mouth at bedtime.    Historical Provider, MD   BP 141/101 mmHg  Pulse 51  Temp(Src) 98 F (36.7 C) (Oral)  Resp 13  SpO2 97% Physical Exam  Constitutional: She is oriented to person, place, and time. She appears well-developed and well-nourished. No distress.  HENT:  Head: Normocephalic and atraumatic.  Eyes: EOM are normal.  Neck: Normal range of motion.  Cardiovascular:  Normal rate, regular rhythm and normal heart sounds.   Pulmonary/Chest: Effort normal and breath sounds normal. She has no wheezes. She has no rales.  Abdominal: Soft. She exhibits no distension. There is no tenderness.  Musculoskeletal: Normal range of motion.  Neurological: She is alert and oriented to person, place, and time.  Skin: Skin is warm and dry.  Psychiatric: She has a normal mood and affect. Judgment normal.  Nursing note and vitals reviewed.   ED Course  Procedures (including critical care time) Labs Review Labs Reviewed  CBC - Abnormal; Notable for the following:    WBC 3.5 (*)    RBC 3.56 (*)    Hemoglobin  11.1 (*)    MCV 103.1 (*)    Platelets 104 (*)    All other components within normal limits  BASIC METABOLIC PANEL - Abnormal; Notable for the following:    Potassium 5.4 (*)    BUN 77 (*)    Creatinine, Ser 2.75 (*)    Calcium 8.6 (*)    GFR calc non Af Amer 15 (*)    GFR calc Af Amer 17 (*)    All other components within normal limits  TROPONIN I - Abnormal; Notable for the following:    Troponin I 0.05 (*)    All other components within normal limits  BRAIN NATRIURETIC PEPTIDE - Abnormal; Notable for the following:    B Natriuretic Peptide 484.1 (*)    All other components within normal limits  BLOOD GAS, ARTERIAL - Abnormal; Notable for the following:    pH, Arterial 7.329 (*)    pCO2 arterial 59.9 (*)    Bicarbonate 30.6 (*)    Acid-Base Excess 3.9 (*)    All other components within normal limits   BUN  Date Value Ref Range Status  05/30/2015 77* 6 - 20 mg/dL Final  04/54/0981 95* 7 - 25 mg/dL Final  19/14/7829 96* 6 - 20 mg/dL Final  56/21/3086 91* 6 - 20 mg/dL Final   CREAT  Date Value Ref Range Status  05/22/2015 2.15* 0.60 - 0.88 mg/dL Final   CREATININE, SER  Date Value Ref Range Status  05/30/2015 2.75* 0.44 - 1.00 mg/dL Final  57/84/6962 9.52* 0.44 - 1.00 mg/dL Final  84/13/2440 1.02* 0.44 - 1.00 mg/dL Final  72/53/6644 0.34*  0.44 - 1.00 mg/dL Final       Imaging Review Dg Chest 2 View  05/30/2015  CLINICAL DATA:  Expiratory wheezing, increased edema, difficulty with nebulizer. History of hypertension, asthma, CHF, former smoker. EXAM: CHEST  2 VIEW COMPARISON:  CT chest 05/09/2015.  Chest 05/09/2015. FINDINGS: Postoperative changes in the mediastinum. Shallow inspiration. Small bilateral pleural effusions with basilar atelectasis. Heart size is enlarged. Pulmonary vascularity is increased. Developing perihilar airspace disease suggesting developing edema. Degenerative changes in the spine. IMPRESSION: Persistent cardiac enlargement with developing pulmonary vascular congestion, small bilateral pleural effusions, basilar atelectasis, and perihilar edema since previous study. Electronically Signed   By: Burman Nieves M.D.   On: 05/30/2015 19:57   I have personally reviewed and evaluated these images and lab results as part of my medical decision-making.   EKG Interpretation   Date/Time:  Thursday May 30 2015 19:49:27 EST Ventricular Rate:  82 PR Interval:  230 QRS Duration: 166 QT Interval:  449 QTC Calculation: 524 R Axis:   -160 Text Interpretation:  Sinus or ectopic atrial rhythm Prolonged PR interval  Right bundle branch block No significant change was found Confirmed by  Pranit Owensby  MD, Nataline Basara ([id number]) on 05/30/2015 11:59:28 PM      MDM   Final diagnoses:  Acute on chronic combined systolic and diastolic heart failure, NYHA class 3 (HCC)   Patient appears to have a mild exacerbation of congestive heart failure.  I do not think she is being admitted the hospital this time.  She is not hypoxic.  Her vital signs are normal.  She be given Lasix here and sent home on 40 twice a day Lasix.  In structured to follow-up with her primary care physician and instructed to follow-up with her cardiologist.  She understands to return to the emergency department for new or worsening symptoms.  No increased work  of  breathing.     Azalia Bilis, MD 05/31/15 [phone]

## 2015-05-30 NOTE — ED Notes (Signed)
Per EMS: pt is coming [facility] [place] [facility], Pt has expiratory wheezing, increased edema, and difficulty with nebulizer. 22 g in right forearm, 125 mg solumedrol and duoneb in route via EMS and 20 mg of lasix at 1330 at [place].

## 2015-05-30 NOTE — ED Notes (Signed)
[phone] Freddi Che is pt's daughter

## 2015-05-30 NOTE — Discharge Instructions (Signed)

## 2015-05-31 MED ORDER — FUROSEMIDE 40 MG PO TABS: 40.0000 mg | ORAL_TABLET | Freq: Once | ORAL | Status: DC

## 2015-05-31 NOTE — ED Notes (Addendum)
Pt's daughter was contacted. Initially she stated "there was no way" she could come get her mother tonight. After this RN reviewed the chart with the pt's mother, she stated "I will see what I can do" in regards to picking up the pt tonight. Per the pt's PCP, the pt should not be home alone due to high fall risk

## 2015-06-02 NOTE — Progress Notes (Signed)
Cardiology Office Note   Date:  06/04/2015   ID:  [name], DOB 1928/04/22, MRN [id number]  PCP:  Thane Edu, MD  Cardiologist:   [name], MD   Chief Complaint  Patient presents with  . Congestive Heart Failure      History of Present Illness: [name] is a 82 y.o. female who presents for follow up of acute heart failure.  The patient was admitted to [facility] from 05/02/2015 to  05/15/2015 with acute systolic heart failure, tachybradycardia syndrome, atrial flutter.  2-D echocardiogram revealed ejection fraction of 30 - 35% is moderate concentric LVH, moderate MR, left atrium was moderately dilated, right ventricle systolic function was moderately reduced, right atrium was moderately dilated and there was moderate tricuspid regurgitation.  She had lower extremity venous Dopplers which were negative for DVT.  She had atrial flutter was treated with Eliquis 2.5 mg(ChADSVASC 9) twice daily and bisoprolol 10 mg and heart rate stabilized into the 80s. She was significantly volume overloaded and diuresed.  She was discharged on torsemide.  Her discharge weight was 198 pounds.  ARB was held due to chronic kidney disease.   This is her second post hospital visit.  She was also in the ED with volume overload.  She has been staying in a nursing home.  In the ED she was thought to have a mild exacerbation of her heart failure and Lasix was added although she was actually already on Torsemide.  She went back to the nursing home.  However, she is now being removed from the nursing home and today is going to live with her daughter.  I did talk with Dr. Mayford Knife who has been working with the patient and her family.  She called to update me and to help me understand that she has had conversations with the patient and her daughter about further care and the severity of the chronic conditions.   Today the patient is sleeping but wakes up to have good conversation.   She is not in any distress.  She does however, have increasing edema.  She is being weighed most days but she spends most of her time with her feet on the ground.  I am not sure whether she has had good salt restriction and whether the I/O are measured accurately.  I have no data from the NH.  She denies any pain.  She doesn't report cough, PND or orthopnea.  She is not having chest pain.     Past Medical History  Diagnosis Date  . Hypertension   . Asthma   . Stroke [facility])     TIA    . Arthritis   . Bradycardia   . Hyperlipidemia   . MI (myocardial infarction) (HCC) 2009  . NSTEMI (non-ST elevated myocardial infarction) (HCC) 08/27/2006    Hattie Perch 09/03/2010  . Type II diabetes mellitus (HCC)     Diet controlled  . CHF (congestive heart failure) (HCC)     Hattie Perch 09/03/2010  . Chronic renal insufficiency     Hattie Perch 09/03/2010  . Coronary artery disease     Past Surgical History  Procedure Laterality Date  . Eye surgery      Glaucoma  . Coronary artery bypass graft  03/18/2007    Dr.Gerhardt LIMA to the LAD, SVG to distal circ and SVG to RCA/notes 09/02/2010  . Cataract extraction w/ intraocular lens implant Left 10/17/2007    Hattie Perch 09/03/2010  . Coronary angioplasty with stent  placement  03/18/2007    3 stents/notes 09/03/2010  . Cardiac catheterization  12/23/2006    Hattie Perch 09/04/2010  . Cardiac catheterization  03/16/2007    Hattie Perch 09/06/2010     Current Outpatient Prescriptions  Medication Sig Dispense Refill  . acetaminophen (TYLENOL) 325 MG tablet Take 650 mg by mouth every 6 (six) hours as needed for mild pain, moderate pain or headache.    . albuterol (PROVENTIL) (2.5 MG/3ML) 0.083% nebulizer solution Take 3 mLs (2.5 mg total) by nebulization every 4 (four) hours as needed for wheezing or shortness of breath. 75 mL 12  . amLODipine (NORVASC) 10 MG tablet Take 10 mg by mouth daily.    [name] apixaban (ELIQUIS) 2.5 MG TABS tablet Take 1 tablet (2.5 mg total) by mouth 2 (two) times daily. 60  tablet 11  . aspirin EC 81 MG tablet Take 81 mg by mouth daily.    [name] atorvastatin (LIPITOR) 20 MG tablet Take 1 tablet (20 mg total) by mouth daily at 6 PM. 30 tablet 6  . bisoprolol (ZEBETA) 10 MG tablet Take 1 tablet (10 mg total) by mouth daily. 30 tablet 3  . budesonide (PULMICORT) 0.25 MG/2ML nebulizer solution Take 2 mLs (0.25 mg total) by nebulization 2 (two) times daily. 60 mL 1  . cholecalciferol (VITAMIN D) 1000 units tablet Take 1,000 Units by mouth daily.    . cyclobenzaprine (FLEXERIL) 10 MG tablet Take 1 tablet (10 mg total) by mouth 3 (three) times daily as needed for muscle spasms. 20 tablet 0  . gabapentin (NEURONTIN) 300 MG capsule Take 300 mg by mouth at bedtime.    [name] guaiFENesin (MUCINEX) 600 MG 12 hr tablet Take 1 tablet (600 mg total) by mouth 2 (two) times daily. 30 tablet 0  . hydrALAZINE (APRESOLINE) 10 MG tablet Take 1 tablet (10 mg total) by mouth every 8 (eight) hours. 90 tablet 3  . hydroxypropyl methylcellulose / hypromellose (ISOPTO TEARS / GONIOVISC) 2.5 % ophthalmic solution Place 1 drop into both eyes 4 (four) times daily as needed for dry eyes.     [name] ipratropium-albuterol (DUONEB) 0.5-2.5 (3) MG/3ML SOLN Take 3 mLs by nebulization 3 (three) times daily. 360 mL 0  . Menthol, Topical Analgesic, (BIOFREEZE) 4 % GEL Apply 1 application topically 2 (two) times daily as needed (pain).    . nitroGLYCERIN (NITROSTAT) 0.4 MG SL tablet Place 1 tablet (0.4 mg total) under the tongue every 5 (five) minutes x 3 doses as needed for chest pain. 25 tablet 1  . PROVENTIL HFA 108 (90 Base) MCG/ACT inhaler Inhale 2 puffs into the lungs every 4 (four) hours as needed for wheezing or shortness of breath.   4  . Skin Protectants, Misc. (EUCERIN) cream Apply 1 application topically 2 (two) times daily.    [name] torsemide (DEMADEX) 20 MG tablet Take 20-40 mg by mouth 2 (two) times daily. 3 tabs (60 mg) in the am and 2 tabs (40 mg) in the early afternoon.     No current facility-administered  medications for this visit.    Allergies:   Nsaids     ROS:  Please see the history of present illness.   Otherwise, review of systems are positive for none.   All other systems are reviewed and negative.    PHYSICAL EXAM: VS:  BP 110/60 mmHg  Pulse 48  Ht  (1.575 m)  Wt 209 lb 4.8 oz (94.938 kg)  BMI 38.27 kg/m2 , BMI Body mass index is 38.27 kg/(m^2).  GEN:  No distress, chronically ill appearing NECK:  No jugular venous distention at 90 degrees, waveform within normal limits, carotid upstroke brisk and symmetric, no bruits, no thyromegaly LYMPHATICS:  No cervical adenopathy LUNGS:  Clear to auscultation bilaterally BACK:  No CVA tenderness CHEST:  Unremarkable HEART:  S1 and S2 within normal limits, no S3, no clicks, no rubs, 3/6 systolic murmur radiating to the apex, holosystolic and no diastolic murmurs, irregular ABD:  Positive bowel sounds normal in frequency in pitch, no bruits, no rebound, no guarding, unable to assess midline mass or bruit with the patient seated. EXT:  2 plus pulses throughout, moderately severe  edema, no cyanosis no clubbing SKIN:  No rashes no nodules NEURO:  Cranial nerves II through XII grossly intact, motor grossly intact throughout PSYCH:  Cognitively intact, oriented to person place and time     EKG:  EKG is not ordered today.    Recent Labs: 05/02/2015: Magnesium 1.9 05/30/2015: B Natriuretic Peptide 484.1*; Hemoglobin 11.1*; Platelets 104* 06/03/2015: BUN 105*; Creat 2.54*; Potassium 4.8; Sodium 142    Lipid Panel    Component Value Date/Time   CHOL 155 05/03/2015 0545   TRIG 63 05/03/2015 0545   HDL 50 05/03/2015 0545   CHOLHDL 3.1 05/03/2015 0545   VLDL 13 05/03/2015 0545   LDLCALC 92 05/03/2015 0545      Wt Readings from Last 3 Encounters:  06/03/15 209 lb 4.8 oz (94.938 kg)  05/22/15 202 lb (91.627 kg)  05/15/15 198 lb 4.8 oz (89.948 kg)      Other studies Reviewed: Additional studies/ records that were reviewed  today include: hospital records, discussion with primary MD. Review of the above records demonstrates:  Please see elsewhere in the note.     ASSESSMENT AND PLAN:   ACUTE ON CHRONIC RENAL INSUFFICIENCY:  I will check a BMET.  We did clarify the dose of diuretic as above.  She has not been taking the additional diuretic dose.  She needs to be keeping her feet elevated.  She needs to restrict salt and fluid intake.  ACUTE ON CHRONIC CHF:  She has a cardiorenal syndrome and responded poorly in the hospital to extensive attempts at volume management.  Her outcome is poor given this.  After speaking with her primary MD I did discuss palliative care with the patient and her daughter.  They will talk to Dr. Mayford Knife about this more.  ATRIAL FLUTTER:   Her creat clearance now calculates to be 25.  I will discuss further with our clinical pharmacist.  Warfarin would be very difficult to prescribe on this patient because of logistics.  She is at high risk for thromboembolic events if we do not use anticoagulation.  For now I will continue the current dose of Eliquis.     Current medicines are reviewed at length with the patient today.  The patient does have concerns regarding medicines.  The following changes have been made:  We clarified the dose of diuretic  Labs/ tests ordered today include:   Orders Placed This Encounter  Procedures  . Basic metabolic panel     Disposition:   FU with APP or me in two weeks.     Signed, [name], MD  06/04/2015 5:43 PM    [facility]

## 2015-06-03 ENCOUNTER — Encounter: Payer: Self-pay | Admitting: Cardiology

## 2015-06-03 ENCOUNTER — Telehealth: Payer: Self-pay | Admitting: Cardiology

## 2015-06-03 ENCOUNTER — Ambulatory Visit (INDEPENDENT_AMBULATORY_CARE_PROVIDER_SITE_OTHER): Payer: Medicare (Managed Care) | Admitting: Cardiology

## 2015-06-03 VITALS — BP 110/60 | HR 48 | Ht 62.0 in | Wt 209.3 lb

## 2015-06-03 DIAGNOSIS — Z79899 Other long term (current) drug therapy: Secondary | ICD-10-CM | POA: Diagnosis not present

## 2015-06-03 DIAGNOSIS — I5043 Acute on chronic combined systolic (congestive) and diastolic (congestive) heart failure: Secondary | ICD-10-CM | POA: Diagnosis not present

## 2015-06-03 LAB — BASIC METABOLIC PANEL
BUN: 105 mg/dL — AB (ref 7–25)
CALCIUM: 8.5 mg/dL — AB (ref 8.6–10.4)
CO2: 30 mmol/L (ref 20–31)
Chloride: 101 mmol/L (ref 98–110)
Creat: 2.54 mg/dL — ABNORMAL HIGH (ref 0.60–0.88)
GLUCOSE: 136 mg/dL — AB (ref 65–99)
Potassium: 4.8 mmol/L (ref 3.5–5.3)
Sodium: 142 mmol/L (ref 135–146)

## 2015-06-03 NOTE — Patient Instructions (Addendum)
Your physician recommends that you schedule a follow-up appointment in: 2 weeks to se a PA.  Your physician recommends that you return for lab work TODAY

## 2015-06-04 ENCOUNTER — Encounter: Payer: Self-pay | Admitting: Cardiology

## 2015-06-17 ENCOUNTER — Ambulatory Visit: Payer: Medicare (Managed Care) | Admitting: Physician Assistant

## 2015-06-18 ENCOUNTER — Encounter (HOSPITAL_COMMUNITY): Payer: Self-pay | Admitting: General Practice

## 2015-06-18 ENCOUNTER — Encounter: Payer: Self-pay | Admitting: Physician Assistant

## 2015-06-18 ENCOUNTER — Inpatient Hospital Stay (HOSPITAL_COMMUNITY)
Admission: AD | Admit: 2015-06-18 | Discharge: 2015-06-19 | DRG: 291 | Disposition: A | Discharge status: Hospice | Payer: Medicare (Managed Care) | Source: Ambulatory Visit | Attending: Cardiology | Admitting: Cardiology

## 2015-06-18 ENCOUNTER — Ambulatory Visit (INDEPENDENT_AMBULATORY_CARE_PROVIDER_SITE_OTHER): Payer: Self-pay | Admitting: Physician Assistant

## 2015-06-18 VITALS — BP 110/60 | HR 72 | Ht 62.0 in | Wt 212.0 lb

## 2015-06-18 DIAGNOSIS — I10 Essential (primary) hypertension: Secondary | ICD-10-CM | POA: Diagnosis not present

## 2015-06-18 DIAGNOSIS — Z7982 Long term (current) use of aspirin: Secondary | ICD-10-CM

## 2015-06-18 DIAGNOSIS — N189 Chronic kidney disease, unspecified: Secondary | ICD-10-CM

## 2015-06-18 DIAGNOSIS — I4892 Unspecified atrial flutter: Secondary | ICD-10-CM | POA: Diagnosis present

## 2015-06-18 DIAGNOSIS — Z8673 Personal history of transient ischemic attack (TIA), and cerebral infarction without residual deficits: Secondary | ICD-10-CM

## 2015-06-18 DIAGNOSIS — I130 Hypertensive heart and chronic kidney disease with heart failure and stage 1 through stage 4 chronic kidney disease, or unspecified chronic kidney disease: Secondary | ICD-10-CM | POA: Diagnosis present

## 2015-06-18 DIAGNOSIS — I255 Ischemic cardiomyopathy: Secondary | ICD-10-CM | POA: Diagnosis present

## 2015-06-18 DIAGNOSIS — I480 Paroxysmal atrial fibrillation: Secondary | ICD-10-CM | POA: Diagnosis present

## 2015-06-18 DIAGNOSIS — E1151 Type 2 diabetes mellitus with diabetic peripheral angiopathy without gangrene: Secondary | ICD-10-CM | POA: Diagnosis present

## 2015-06-18 DIAGNOSIS — Z951 Presence of aortocoronary bypass graft: Secondary | ICD-10-CM

## 2015-06-18 DIAGNOSIS — I5043 Acute on chronic combined systolic (congestive) and diastolic (congestive) heart failure: Secondary | ICD-10-CM

## 2015-06-18 DIAGNOSIS — E1122 Type 2 diabetes mellitus with diabetic chronic kidney disease: Secondary | ICD-10-CM | POA: Diagnosis present

## 2015-06-18 DIAGNOSIS — E7800 Pure hypercholesterolemia, unspecified: Secondary | ICD-10-CM | POA: Diagnosis present

## 2015-06-18 DIAGNOSIS — I5020 Unspecified systolic (congestive) heart failure: Secondary | ICD-10-CM | POA: Diagnosis not present

## 2015-06-18 DIAGNOSIS — N184 Chronic kidney disease, stage 4 (severe): Secondary | ICD-10-CM | POA: Diagnosis present

## 2015-06-18 DIAGNOSIS — I5021 Acute systolic (congestive) heart failure: Secondary | ICD-10-CM | POA: Diagnosis present

## 2015-06-18 DIAGNOSIS — I5023 Acute on chronic systolic (congestive) heart failure: Secondary | ICD-10-CM | POA: Diagnosis present

## 2015-06-18 DIAGNOSIS — R0602 Shortness of breath: Secondary | ICD-10-CM | POA: Diagnosis present

## 2015-06-18 DIAGNOSIS — Z515 Encounter for palliative care: Secondary | ICD-10-CM

## 2015-06-18 DIAGNOSIS — E119 Type 2 diabetes mellitus without complications: Secondary | ICD-10-CM

## 2015-06-18 DIAGNOSIS — I509 Heart failure, unspecified: Secondary | ICD-10-CM

## 2015-06-18 DIAGNOSIS — R32 Unspecified urinary incontinence: Secondary | ICD-10-CM | POA: Diagnosis present

## 2015-06-18 DIAGNOSIS — D649 Anemia, unspecified: Secondary | ICD-10-CM

## 2015-06-18 DIAGNOSIS — R269 Unspecified abnormalities of gait and mobility: Secondary | ICD-10-CM

## 2015-06-18 HISTORY — DX: Transient cerebral ischemic attack, unspecified: G45.9

## 2015-06-18 HISTORY — DX: Encounter for palliative care: Z51.5

## 2015-06-18 LAB — COMPREHENSIVE METABOLIC PANEL
ALBUMIN: 3.6 g/dL (ref 3.5–5.0)
ALK PHOS: 92 U/L (ref 38–126)
ALT: 22 U/L (ref 14–54)
ANION GAP: 13 (ref 5–15)
AST: 31 U/L (ref 15–41)
BILIRUBIN TOTAL: 0.9 mg/dL (ref 0.3–1.2)
BUN: 62 mg/dL — AB (ref 6–20)
CALCIUM: 9.4 mg/dL (ref 8.9–10.3)
CO2: 34 mmol/L — AB (ref 22–32)
CREATININE: 1.78 mg/dL — AB (ref 0.44–1.00)
Chloride: 100 mmol/L — ABNORMAL LOW (ref 101–111)
GFR calc Af Amer: 29 mL/min — ABNORMAL LOW (ref >60)
GFR calc non Af Amer: 25 mL/min — ABNORMAL LOW (ref >60)
GLUCOSE: 107 mg/dL — AB (ref 65–99)
Potassium: 3.9 mmol/L (ref 3.5–5.1)
SODIUM: 147 mmol/L — AB (ref 135–145)
TOTAL PROTEIN: 6.8 g/dL (ref 6.5–8.1)

## 2015-06-18 LAB — CBC WITH DIFFERENTIAL/PLATELET
BASOS ABS: 0 10*3/uL (ref 0.0–0.1)
Basophils Relative: 0 %
EOS PCT: 2 %
Eosinophils Absolute: 0.1 10*3/uL (ref 0.0–0.7)
HEMATOCRIT: 37.5 % (ref 36.0–46.0)
Hemoglobin: 11.6 g/dL — ABNORMAL LOW (ref 12.0–15.0)
LYMPHS ABS: 0.9 10*3/uL (ref 0.7–4.0)
LYMPHS PCT: 29 %
MCH: 30.4 pg (ref 26.0–34.0)
MCHC: 30.9 g/dL (ref 30.0–36.0)
MCV: 98.4 fL (ref 78.0–100.0)
MONO ABS: 0.2 10*3/uL (ref 0.1–1.0)
MONOS PCT: 7 %
NEUTROS ABS: 1.8 10*3/uL (ref 1.7–7.7)
Neutrophils Relative %: 62 %
PLATELETS: 101 10*3/uL — AB (ref 150–400)
RBC: 3.81 MIL/uL — ABNORMAL LOW (ref 3.87–5.11)
RDW: 14.6 % (ref 11.5–15.5)
WBC: 3 10*3/uL — ABNORMAL LOW (ref 4.0–10.5)

## 2015-06-18 LAB — TROPONIN I: Troponin I: 0.07 ng/mL — ABNORMAL HIGH (ref <0.031)

## 2015-06-18 LAB — MAGNESIUM: Magnesium: 2 mg/dL (ref 1.7–2.4)

## 2015-06-18 LAB — BRAIN NATRIURETIC PEPTIDE: B NATRIURETIC PEPTIDE 5: 984.4 pg/mL — AB (ref 0.0–100.0)

## 2015-06-18 MED ORDER — SODIUM CHLORIDE 0.9% FLUSH: 3.0000 mL | INTRAVENOUS | Status: DC | PRN

## 2015-06-18 MED ORDER — ONDANSETRON HCL 4 MG/2ML IJ SOLN: 4.0000 mg | Freq: Four times a day (QID) | INTRAMUSCULAR | Status: DC | PRN

## 2015-06-18 MED ORDER — SODIUM CHLORIDE 0.9 % IV SOLN: 250.0000 mL | INTRAVENOUS | Status: DC | PRN

## 2015-06-18 MED ORDER — ALPRAZOLAM 0.25 MG PO TABS: 0.2500 mg | ORAL_TABLET | Freq: Two times a day (BID) | ORAL | Status: DC | PRN

## 2015-06-18 MED ORDER — NITROGLYCERIN 0.4 MG SL SUBL: 0.4000 mg | SUBLINGUAL_TABLET | SUBLINGUAL | Status: DC | PRN

## 2015-06-18 MED ORDER — SODIUM CHLORIDE 0.9% FLUSH
3.0000 mL | Freq: Two times a day (BID) | INTRAVENOUS | Status: DC
  Administered 2015-06-19: 3 mL via INTRAVENOUS

## 2015-06-18 MED ORDER — ZOLPIDEM TARTRATE 5 MG PO TABS: 5.0000 mg | ORAL_TABLET | Freq: Every evening | ORAL | Status: DC | PRN

## 2015-06-18 MED ORDER — ACETAMINOPHEN 325 MG PO TABS: 650.0000 mg | ORAL_TABLET | ORAL | Status: DC | PRN

## 2015-06-18 MED ORDER — INSULIN ASPART 100 UNIT/ML ~~LOC~~ SOLN: 0.0000 [IU] | Freq: Every day | SUBCUTANEOUS | Status: DC

## 2015-06-18 MED ORDER — INSULIN ASPART 100 UNIT/ML ~~LOC~~ SOLN
0.0000 [IU] | Freq: Three times a day (TID) | SUBCUTANEOUS | Status: DC
Start: 2015-06-19 — End: 2015-06-19

## 2015-06-18 MED ORDER — FUROSEMIDE 10 MG/ML IJ SOLN: 80.0000 mg | Freq: Two times a day (BID) | INTRAMUSCULAR | Status: DC

## 2015-06-18 NOTE — Progress Notes (Signed)
   06/18/15 2141  Vitals  Temp 97.4 F (36.3 C)  Temp Source Oral  BP 129/77 mmHg  BP Location Left Arm  BP Method Automatic  Patient Position (if appropriate) Sitting  Pulse Rate (!) 58  Pulse Rate Source Dinamap  Resp 18  Oxygen Therapy  SpO2 95 %  O2 Device Room Air   Pt. refused IV and foley tonight. Pt is a hard stick, day shift tried getting an access this afternoon and I had IV team come up tonight but both failed. Pt. Is frustrated and don't want anymore work done on her tonight. Pt educated. Dr. Virgina Organ notified.

## 2015-06-18 NOTE — Patient Instructions (Signed)
You are being admitted to [facility]. Please go to the main entrance "[address]" located on [place].  Please head to the admissions office upon arrival.

## 2015-06-18 NOTE — H&P (Signed)
Cardiology Office Note   Date: 06/18/2015   ID: [name], DOB January 26, 1928, MRN [id number]  PCP: Thane Edu, MD Cardiologist: Dr Luna Kitchens, PA-C   Chief Complaint  Patient presents with  . Follow-up    no chest pain, some shortness of breath, swelling, no cramping, occ dizziness & lightheadedness    History of Present Illness: [name] is a 82 y.o. female with a history of CABG, TIA, HTN, DM, peripheral vascular disease with carotid artery stenosis, seizure, HL, chronic kidney disease stage IV and bradycardia  D/c 01/11 w/ S-CHF, A flutter, tachy-brady, EF 30-35%.   05/22/2015 office visit. She was still volume overloaded and up 4 pounds from her previous weight. Her torsemide was increased from 40 a.m./20 p.m. To 60 mg a.m./40 mg p.m. Metolazone was added 06/04/2015. [facility] with medical staff. She has been followed there and is getting her medications consistently. They are also monitoring her weight. Her weight had been decreasing, but today it was up several pounds. There is almost no net weight loss.  [name] presents for Further evaluation and management of her heart failure.  Ms. Conley gets Short of breath walking to the bathroom. Other than going to the bathroom and the little around the house, she does not walk at all. She does not feel her lower extremity edema has changed at all. She has orthopnea and possibly PND, but this is not clear. Her daughter feels like they are giving her a low sodium diet at [facility]. She does not appear to be over drinking.   She has not had chest pain or palpitations.    Past Medical History  Diagnosis Date  . Hypertension   . Asthma   . Stroke [facility])     TIA   . Arthritis   . Bradycardia   . Hyperlipidemia   . MI (myocardial infarction) (HCC) 2009  . NSTEMI (non-ST  elevated myocardial infarction) (HCC) 08/27/2006    Hattie Perch 09/03/2010  . Type II diabetes mellitus (HCC)     Diet controlled  . CHF (congestive heart failure) (HCC)     Hattie Perch 09/03/2010  . Chronic renal insufficiency     Hattie Perch 09/03/2010  . Coronary artery disease     Past Surgical History  Procedure Laterality Date  . Eye surgery      Glaucoma  . Coronary artery bypass graft  03/18/2007    Dr.Gerhardt LIMA to the LAD, SVG to distal circ and SVG to RCA/notes 09/02/2010  . Cataract extraction w/ intraocular lens implant Left 10/17/2007    Hattie Perch 09/03/2010  . Coronary angioplasty with stent placement  03/18/2007    3 stents/notes 09/03/2010  . Cardiac catheterization  12/23/2006    Hattie Perch 09/04/2010  . Cardiac catheterization  03/16/2007    Hattie Perch 09/06/2010    Current Outpatient Prescriptions  Medication Sig Dispense Refill  . acetaminophen (TYLENOL) 325 MG tablet Take 650 mg by mouth every 6 (six) hours as needed for mild pain, moderate pain or headache.    . albuterol (PROVENTIL) (2.5 MG/3ML) 0.083% nebulizer solution Take 3 mLs (2.5 mg total) by nebulization every 4 (four) hours as needed for wheezing or shortness of breath. 75 mL 12  . amLODipine (NORVASC) 10 MG tablet Take 10 mg by mouth daily.    [name] apixaban (ELIQUIS) 2.5 MG TABS tablet Take 1 tablet (2.5 mg total) by mouth 2 (two) times daily. 60 tablet  11  . aspirin EC 81 MG tablet Take 81 mg by mouth daily.    [name] atorvastatin (LIPITOR) 20 MG tablet Take 1 tablet (20 mg total) by mouth daily at 6 PM. 30 tablet 6  . bisoprolol (ZEBETA) 10 MG tablet Take 1 tablet (10 mg total) by mouth daily. 30 tablet 3  . budesonide (PULMICORT) 0.25 MG/2ML nebulizer solution Take 2 mLs (0.25 mg total) by nebulization 2 (two) times daily. 60 mL 1  . cholecalciferol (VITAMIN D) 1000 units tablet Take 1,000 Units by mouth daily.    .  cyclobenzaprine (FLEXERIL) 10 MG tablet Take 1 tablet (10 mg total) by mouth 3 (three) times daily as needed for muscle spasms. 20 tablet 0  . gabapentin (NEURONTIN) 300 MG capsule Take 300 mg by mouth at bedtime.    [name] guaiFENesin (MUCINEX) 600 MG 12 hr tablet Take 1 tablet (600 mg total) by mouth 2 (two) times daily. 30 tablet 0  . hydrALAZINE (APRESOLINE) 10 MG tablet Take 1 tablet (10 mg total) by mouth every 8 (eight) hours. 90 tablet 3  . hydroxypropyl methylcellulose / hypromellose (ISOPTO TEARS / GONIOVISC) 2.5 % ophthalmic solution Place 1 drop into both eyes 4 (four) times daily as needed for dry eyes.     [name] ipratropium-albuterol (DUONEB) 0.5-2.5 (3) MG/3ML SOLN Take 3 mLs by nebulization 3 (three) times daily. 360 mL 0  . Menthol, Topical Analgesic, (BIOFREEZE) 4 % GEL Apply 1 application topically 2 (two) times daily as needed (pain).    . nitroGLYCERIN (NITROSTAT) 0.4 MG SL tablet Place 1 tablet (0.4 mg total) under the tongue every 5 (five) minutes x 3 doses as needed for chest pain. 25 tablet 1  . PROVENTIL HFA 108 (90 Base) MCG/ACT inhaler Inhale 2 puffs into the lungs every 4 (four) hours as needed for wheezing or shortness of breath.   4  . Skin Protectants, Misc. (EUCERIN) cream Apply 1 application topically 2 (two) times daily.    [name] torsemide (DEMADEX) 20 MG tablet Take 20-40 mg by mouth 2 (two) times daily. 3 tabs (60 mg) in the am and 2 tabs (40 mg) in the early afternoon.     No current facility-administered medications for this visit.    Allergies: Nsaids    Social History: The patient  reports that she quit smoking about 30 years ago. Her smoking use included Cigarettes. She has a 4.95 pack-year smoking history. She has never used smokeless tobacco. She reports that she does not drink alcohol or use illicit drugs.   Family History: The patient's family history includes Heart disease in her father and mother. Her  mother died at age 39, with a history of coronary artery disease. Her father died in his 32s, and she does not believe he had heart disease. She has no siblings. There is no history of premature coronary artery disease in any other relatives.   ROS: Please see the history of present illness. All other systems are reviewed and negative.    PHYSICAL EXAM: VS: BP 110/60 mmHg  Pulse 72  Ht  (1.575 m)  Wt 212 lb (96.163 kg)  BMI 38.77 kg/m2 , BMI Body mass index is 38.77 kg/(m^2). GEN: Well nourished, well developed, female in no acute distress  HEENT: normal for age  Neck: JVD to jaw, no carotid bruit, no masses Cardiac: RRR; soft murmur, no rubs, or gallops Respiratory: Decreased breath sounds bases with Rales bilaterally, normal work of breathing GI: soft, nontender, nondistended, + BS  MS: no deformity or atrophy; 2-3+ edema; distal pulses are 2+ in all 4 extremities  Skin: warm and dry, no rash Neuro: Strength and sensation are intact Psych: euthymic mood, full affect   EKG: EKG is not ordered today.   Recent Labs: 05/02/2015: Magnesium 1.9 05/30/2015: B Natriuretic Peptide 484.1*; Hemoglobin 11.1*; Platelets 104* 06/03/2015: BUN 105*; Creat 2.54*; Potassium 4.8; Sodium 142    Lipid Panel  Labs (Brief)       Component Value Date/Time   CHOL 155 05/03/2015 0545   TRIG 63 05/03/2015 0545   HDL 50 05/03/2015 0545   CHOLHDL 3.1 05/03/2015 0545   VLDL 13 05/03/2015 0545   LDLCALC 92 05/03/2015 0545      Wt Readings from Last 3 Encounters:  06/18/15 212 lb (96.163 kg)  06/03/15 209 lb 4.8 oz (94.938 kg)  05/22/15 202 lb (91.627 kg)     Other studies Reviewed: Additional studies/ records that were reviewed today include: Records from [facility], Hospital records and previous office notes.  ASSESSMENT AND PLAN: Dr. [place] was in to see [name]  1. Acute on chronic systolic CHF: she is class III , getting short  of breath with minimal activity. She has taken a total of 100 mg of Demadex daily with 2.5 mg a rock without any significant improvement. She has significant volume overload by exam. Admission to the hospital is indicated for IV diuresis. Her daily weights, renal function and electrolytes as well as her intake/output will be monitored carefully. Hopefully she will improve quickly.  2. Hypertension: Hopefully her blood pressure will improve with diuresis  3. Diabetes: Not on home medication, ck A1c, add sliding scale insulin.  4. Ischemic cardiomyopathy: she is on aspirin, statin, beta blocker but no ace or ARB. Due to poor Renal function.  5. PAF: HR is regular on exam, ck ECG, continue Eliquis   Current medicines are reviewed at length with the patient today. The patient does not have concerns regarding medicines.  The following changes have been made: Follow-up after the hospitalization  Labs/ tests ordered today include:  No orders of the defined types were placed in this encounter.  Disposition: FU with Dr. Antoine Poche after hospitalization  Signed, Leanna Battles  06/18/2015 4:21 PM  [facility] [address] [place], [place], [place] [address] Phone: [phone]; Fax: [phone]

## 2015-06-18 NOTE — Progress Notes (Signed)
Cardiology Office Note   Date:  06/18/2015   ID:  [name], DOB 1927/11/06, MRN [id number]  PCP:  Thane Edu, MD  Cardiologist:  Dr Luna Kitchens, PA-C   Chief Complaint  Patient presents with  . Follow-up    no chest pain, some shortness of breath, swelling, no cramping, occ dizziness & lightheadedness    History of Present Illness: [name] is a 82 y.o. female with a history of CABG, TIA, HTN, DM, peripheral vascular disease with carotid artery stenosis, seizure, HL, chronic kidney disease stage IV and bradycardia  D/c 01/11 w/ S-CHF, A flutter, tachy-brady, EF 30-35%.     05/22/2015 office visit.  She was still volume overloaded and up 4 pounds from her previous weight. Her torsemide was increased from 40 a.m./20 p.m. To 60 mg a.m./40 mg p.m.  Metolazone was added 06/04/2015.  [facility] with medical staff. She has been followed there and is getting her medications consistently. They are also monitoring her weight. Her weight had been decreasing, but today it was up several pounds. There is almost no net weight loss.   [name] presents for  Further evaluation and management of her heart failure.   [name] gets  Short of breath walking to the bathroom.  Other than going to the bathroom and the little around the house, she does not walk at all. She does not feel her lower extremity edema has changed at all. She has orthopnea and possibly PND, but this is not clear. Her daughter feels like they are giving her a low sodium diet at [facility]. She does not appear to be over drinking.    She has not had chest pain or palpitations.    Past Medical History  Diagnosis Date  . Hypertension   . Asthma   . Stroke [facility])     TIA    . Arthritis   . Bradycardia   . Hyperlipidemia   . MI (myocardial infarction) (HCC) 2009  . NSTEMI (non-ST elevated myocardial infarction) (HCC) 08/27/2006   Hattie Perch 09/03/2010  . Type II diabetes mellitus (HCC)     Diet controlled  . CHF (congestive heart failure) (HCC)     Hattie Perch 09/03/2010  . Chronic renal insufficiency     Hattie Perch 09/03/2010  . Coronary artery disease     Past Surgical History  Procedure Laterality Date  . Eye surgery      Glaucoma  . Coronary artery bypass graft  03/18/2007    Dr.Gerhardt LIMA to the LAD, SVG to distal circ and SVG to RCA/notes 09/02/2010  . Cataract extraction w/ intraocular lens implant Left 10/17/2007    Hattie Perch 09/03/2010  . Coronary angioplasty with stent placement  03/18/2007    3 stents/notes 09/03/2010  . Cardiac catheterization  12/23/2006    Hattie Perch 09/04/2010  . Cardiac catheterization  03/16/2007    Hattie Perch 09/06/2010    Current Outpatient Prescriptions  Medication Sig Dispense Refill  . acetaminophen (TYLENOL) 325 MG tablet Take 650 mg by mouth every 6 (six) hours as needed for mild pain, moderate pain or headache.    . albuterol (PROVENTIL) (2.5 MG/3ML) 0.083% nebulizer solution Take 3 mLs (2.5 mg total) by nebulization every 4 (four) hours as needed for wheezing or shortness of breath. 75 mL 12  . amLODipine (NORVASC) 10 MG tablet Take 10 mg by mouth daily.    [name] apixaban (ELIQUIS) 2.5 MG  TABS tablet Take 1 tablet (2.5 mg total) by mouth 2 (two) times daily. 60 tablet 11  . aspirin EC 81 MG tablet Take 81 mg by mouth daily.    [name] atorvastatin (LIPITOR) 20 MG tablet Take 1 tablet (20 mg total) by mouth daily at 6 PM. 30 tablet 6  . bisoprolol (ZEBETA) 10 MG tablet Take 1 tablet (10 mg total) by mouth daily. 30 tablet 3  . budesonide (PULMICORT) 0.25 MG/2ML nebulizer solution Take 2 mLs (0.25 mg total) by nebulization 2 (two) times daily. 60 mL 1  . cholecalciferol (VITAMIN D) 1000 units tablet Take 1,000 Units by mouth daily.    . cyclobenzaprine (FLEXERIL) 10 MG tablet Take 1 tablet (10 mg total) by mouth 3 (three) times daily as needed for muscle spasms. 20 tablet 0  . gabapentin (NEURONTIN) 300 MG  capsule Take 300 mg by mouth at bedtime.    [name] guaiFENesin (MUCINEX) 600 MG 12 hr tablet Take 1 tablet (600 mg total) by mouth 2 (two) times daily. 30 tablet 0  . hydrALAZINE (APRESOLINE) 10 MG tablet Take 1 tablet (10 mg total) by mouth every 8 (eight) hours. 90 tablet 3  . hydroxypropyl methylcellulose / hypromellose (ISOPTO TEARS / GONIOVISC) 2.5 % ophthalmic solution Place 1 drop into both eyes 4 (four) times daily as needed for dry eyes.     [name] ipratropium-albuterol (DUONEB) 0.5-2.5 (3) MG/3ML SOLN Take 3 mLs by nebulization 3 (three) times daily. 360 mL 0  . Menthol, Topical Analgesic, (BIOFREEZE) 4 % GEL Apply 1 application topically 2 (two) times daily as needed (pain).    . nitroGLYCERIN (NITROSTAT) 0.4 MG SL tablet Place 1 tablet (0.4 mg total) under the tongue every 5 (five) minutes x 3 doses as needed for chest pain. 25 tablet 1  . PROVENTIL HFA 108 (90 Base) MCG/ACT inhaler Inhale 2 puffs into the lungs every 4 (four) hours as needed for wheezing or shortness of breath.   4  . Skin Protectants, Misc. (EUCERIN) cream Apply 1 application topically 2 (two) times daily.    [name] torsemide (DEMADEX) 20 MG tablet Take 20-40 mg by mouth 2 (two) times daily. 3 tabs (60 mg) in the am and 2 tabs (40 mg) in the early afternoon.     No current facility-administered medications for this visit.    Allergies:   Nsaids    Social History:  The patient  reports that she quit smoking about 30 years ago. Her smoking use included Cigarettes. She has a 4.95 pack-year smoking history. She has never used smokeless tobacco. She reports that she does not drink alcohol or use illicit drugs.   Family History:  The patient's family history includes Heart disease in her father and mother. Her mother died at age 42, with a history of coronary artery disease. Her father died in his 29s, and she does not believe he had heart disease. She has no siblings. There is no history of premature coronary artery disease in  any other relatives.   ROS:  Please see the history of present illness. All other systems are reviewed and negative.    PHYSICAL EXAM: VS:  BP 110/60 mmHg  Pulse 72  Ht  (1.575 m)  Wt 212 lb (96.163 kg)  BMI 38.77 kg/m2 , BMI Body mass index is 38.77 kg/(m^2). GEN: Well nourished, well developed, female in no acute distress HEENT: normal for age  Neck: JVD to jaw, no carotid bruit, no masses Cardiac: RRR;  soft murmur,  no rubs, or gallops Respiratory:  Decreased breath sounds bases with Rales bilaterally, normal work of breathing GI: soft, nontender, nondistended, + BS MS: no deformity or atrophy;  2-3+ edema; distal pulses are 2+ in all 4 extremities  Skin: warm and dry, no rash Neuro:  Strength and sensation are intact Psych: euthymic mood, full affect   EKG:  EKG is not ordered today.   Recent Labs: 05/02/2015: Magnesium 1.9 05/30/2015: B Natriuretic Peptide 484.1*; Hemoglobin 11.1*; Platelets 104* 06/03/2015: BUN 105*; Creat 2.54*; Potassium 4.8; Sodium 142    Lipid Panel    Component Value Date/Time   CHOL 155 05/03/2015 0545   TRIG 63 05/03/2015 0545   HDL 50 05/03/2015 0545   CHOLHDL 3.1 05/03/2015 0545   VLDL 13 05/03/2015 0545   LDLCALC 92 05/03/2015 0545     Wt Readings from Last 3 Encounters:  06/18/15 212 lb (96.163 kg)  06/03/15 209 lb 4.8 oz (94.938 kg)  05/22/15 202 lb (91.627 kg)     Other studies Reviewed: Additional studies/ records that were reviewed today include:  Records from [facility],  Hospital records and previous office notes.  ASSESSMENT AND PLAN:  Dr. [place] was in to see [name]  1.   Acute on chronic systolic CHF: she is class III , getting short of breath with minimal activity. She has taken a total of 100 mg of Demadex daily with 2.5 mg a rock without any significant improvement. She has significant volume overload by exam. Admission to the hospital is indicated for IV diuresis. Her daily weights, renal function and electrolytes  as well as her intake/output will be monitored carefully. Hopefully she will improve quickly.   Hypertension: Hopefully her blood pressure will improve with diuresis  3. Diabetes: continue home medication plus sliding scale insulin.   4. Ischemic cardiomyopathy: she is on aspirin, statin, beta blocker but no ace or ARB. If her renal function is stable, she may be a candidate for Entresto   Current medicines are reviewed at length with the patient today.  The patient does not have concerns regarding medicines.  The following changes have been made:   Follow-up after the hospitalization  Labs/ tests ordered today include:  No orders of the defined types were placed in this encounter.   Disposition:   FU with  Dr. Antoine Poche after hospitalization  Signed, Leanna Battles  06/18/2015 4:21 PM    [facility] [address] [place], [place], [place]  [address] Phone: [phone]; Fax: [phone]

## 2015-06-19 ENCOUNTER — Encounter (HOSPITAL_COMMUNITY): Payer: Self-pay | Admitting: Physician Assistant

## 2015-06-19 ENCOUNTER — Other Ambulatory Visit: Payer: Self-pay

## 2015-06-19 DIAGNOSIS — I5020 Unspecified systolic (congestive) heart failure: Secondary | ICD-10-CM

## 2015-06-19 DIAGNOSIS — Z515 Encounter for palliative care: Secondary | ICD-10-CM

## 2015-06-19 LAB — BASIC METABOLIC PANEL
Anion gap: 11 (ref 5–15)
BUN: 58 mg/dL — ABNORMAL HIGH (ref 6–20)
CHLORIDE: 99 mmol/L — AB (ref 101–111)
CO2: 34 mmol/L — ABNORMAL HIGH (ref 22–32)
CREATININE: 1.68 mg/dL — AB (ref 0.44–1.00)
Calcium: 9.2 mg/dL (ref 8.9–10.3)
GFR calc non Af Amer: 27 mL/min — ABNORMAL LOW (ref >60)
GFR, EST AFRICAN AMERICAN: 31 mL/min — AB (ref >60)
Glucose, Bld: 108 mg/dL — ABNORMAL HIGH (ref 65–99)
POTASSIUM: 4 mmol/L (ref 3.5–5.1)
SODIUM: 144 mmol/L (ref 135–145)

## 2015-06-19 LAB — TROPONIN I
TROPONIN I: 0.06 ng/mL — AB (ref <0.031)
Troponin I: 0.06 ng/mL — ABNORMAL HIGH (ref <0.031)

## 2015-06-19 LAB — HEMOGLOBIN A1C
HEMOGLOBIN A1C: 6.5 % — AB (ref 4.8–5.6)
MEAN PLASMA GLUCOSE: 140 mg/dL

## 2015-06-19 MED ORDER — IPRATROPIUM-ALBUTEROL 0.5-2.5 (3) MG/3ML IN SOLN: 3.0000 mL | Freq: Three times a day (TID) | RESPIRATORY_TRACT | Status: DC

## 2015-06-19 MED ORDER — BISOPROLOL FUMARATE 5 MG PO TABS
10.0000 mg | ORAL_TABLET | Freq: Every day | ORAL | Status: DC
  Administered 2015-06-19: 10 mg via ORAL
  Filled 2015-06-19: qty 2

## 2015-06-19 MED ORDER — BUDESONIDE 0.25 MG/2ML IN SUSP: 0.2500 mg | Freq: Two times a day (BID) | RESPIRATORY_TRACT | Status: DC

## 2015-06-19 MED ORDER — APIXABAN 2.5 MG PO TABS
2.5000 mg | ORAL_TABLET | Freq: Two times a day (BID) | ORAL | Status: DC
  Administered 2015-06-19: 2.5 mg via ORAL
  Filled 2015-06-19: qty 1

## 2015-06-19 NOTE — Progress Notes (Signed)
Patient did not recall requesting prayer, but chaplain noted AD next to bed and inquired about it. Patient said this was something seniors should know about and asked if we could speak to the senior center she attends. I told her I would pass on that suggestion, but she did not seem interested in further conversation.

## 2015-06-19 NOTE — Discharge Summary (Signed)
The patient is under Palliative care of Dr. Charissa Bash. Please direct any goals and treatment to Dr. Mayford Knife.         Discharge Summary    Patient ID: [name],  MRN: [id number], DOB/AGE: 09-Mar-1928 82 y.o.  Admit date: 06/18/2015 Discharge date: 06/19/2015  Primary Care Provider: Thane Edu Primary Cardiologist: Dr. Antoine Poche Palliative care: Dr. Charissa Bash  Discharge Diagnoses    Principal Problem:   Acute on chronic systolic heart failure [facility]) Active Problems:   Diabetes mellitus type II, non insulin dependent (HCC)   Pure hypercholesterolemia   GAIT IMBALANCE   HTN (hypertension)   Atrial flutter (HCC)   CHF (congestive heart failure) (HCC)   Palliative care status   Allergies Allergies  Allergen Reactions  . Nsaids Other (See Comments)    REACTION: renal insufficiency    Diagnostic Studies/Procedures    None   History of Present Illness     [name] is a 82 y.o. female with a history of CABG, TIA, HTN, DM, peripheral vascular disease with carotid artery stenosis, seizure, HL, chronic kidney disease stage IV and bradycardia admitted directly from office for IV diuresis.   D/c 01/11 w/ S-CHF, A flutter, tachy-brady, EF 30-35%.   05/22/2015 office visit. She was still volume overloaded and up 4 pounds from her previous weight. Her torsemide was increased from 40 a.m./20 p.m. To 60 mg a.m./40 mg p.m. Metolazone was added 06/04/2015. [facility] with medical staff. She has been followed there and is getting her medications consistently. They are also monitoring her weight. Her weight had been decreasing, but today it was up several pounds. There is almost no net weight loss.  The patient was seen in clinic 06/18/15 for management of heart failure. She gets Short of breath walking to the bathroom. Other than going to the bathroom and the little around the house, she does not  walk at all. She does not feel her lower extremity edema has changed at all. She has orthopnea and possibly PND, but this is not clear. Her daughter feels like they are giving her a low sodium diet at [facility]. She does not appear to be over drinking.   The patient was admitted directly for IV diuresis.   Hospital Course     Consultants: None   The patient was admitted for diuresis however unsuccessful attempt to place IV x 2, refused afterwards. Thus unable to gave any diuretics. She has incontinence, refused foley. She also refused to check blood glucose. BNP of 984. Apparently during admission (next day) it was discovered that patient is on palliative care under Dr. Mayford Knife. Family meeting was held on 06/06/15 with patient's daughter, SW other significants and patient was placed on palliative care (This information was available at day of discharge). The patient was discharged on same home medications and will f/u with Dr. Mayford Knife in further for any concern.   The patient has been seen by Dr. Eden Emms today and deemed ready for discharge home.   Discharge Vitals Blood pressure 138/99, pulse 62, temperature 98.6 F (37 C), temperature source Oral, resp. rate 20, weight 201 lb 8 oz (91.4 kg), SpO2 95 %.  Filed Weights   06/19/15 0900  Weight: 201 lb 8 oz (91.4 kg)    Labs & Radiologic Studies     CBC  Recent Labs  06/18/15 1848  WBC 3.0*  NEUTROABS 1.8  HGB 11.6*  HCT 37.5  MCV 98.4  PLT 101*   Basic Metabolic Panel  Recent Labs  06/18/15 1848 06/19/15 0544  NA 147* 144  K 3.9 4.0  CL 100* 99*  CO2 34* 34*  GLUCOSE 107* 108*  BUN 62* 58*  CREATININE 1.78* 1.68*  CALCIUM 9.4 9.2  MG 2.0  --    Liver Function Tests  Recent Labs  06/18/15 1848  AST 31  ALT 22  ALKPHOS 92  BILITOT 0.9  PROT 6.8  ALBUMIN 3.6   No results for input(s): LIPASE, AMYLASE in the last 72 hours. Cardiac Enzymes  Recent Labs  06/18/15 1848 06/19/15 0014 06/19/15 0544  TROPONINI  0.07* 0.06* 0.06*   BNP Invalid input(s): POCBNP D-Dimer No results for input(s): DDIMER in the last 72 hours. Hemoglobin A1C  Recent Labs  06/18/15 1848  HGBA1C 6.5*   Fasting Lipid Panel No results for input(s): CHOL, HDL, LDLCALC, TRIG, CHOLHDL, LDLDIRECT in the last 72 hours. Thyroid Function Tests No results for input(s): TSH, T4TOTAL, T3FREE, THYROIDAB in the last 72 hours.  Invalid input(s): FREET3  Dg Chest 2 View  05/30/2015  CLINICAL DATA:  Expiratory wheezing, increased edema, difficulty with nebulizer. History of hypertension, asthma, CHF, former smoker. EXAM: CHEST  2 VIEW COMPARISON:  CT chest 05/09/2015.  Chest 05/09/2015. FINDINGS: Postoperative changes in the mediastinum. Shallow inspiration. Small bilateral pleural effusions with basilar atelectasis. Heart size is enlarged. Pulmonary vascularity is increased. Developing perihilar airspace disease suggesting developing edema. Degenerative changes in the spine. IMPRESSION: Persistent cardiac enlargement with developing pulmonary vascular congestion, small bilateral pleural effusions, basilar atelectasis, and perihilar edema since previous study. Electronically Signed   By: Burman Nieves M.D.   On: 05/30/2015 19:57    Disposition   Pt is being discharged home today in good condition.  Follow-up Plans & Appointments    Follow-up Information    Follow up with Miguel Aschoff, MD.   Specialty:  Internal Medicine   Why:  as schedule   Contact information:   [address] [place] [address] [phone]      Discharge Instructions    Diet - low sodium heart healthy    Complete by:  As directed      Increase activity slowly    Complete by:  As directed            Discharge Medications   Current Discharge Medication List    CONTINUE these medications which have NOT CHANGED   Details  acetaminophen (TYLENOL) 325 MG tablet Take 650 mg by mouth every 6 (six) hours as needed for mild pain,  moderate pain or headache. Reported on 06/18/2015    apixaban (ELIQUIS) 2.5 MG TABS tablet Take 1 tablet (2.5 mg total) by mouth 2 (two) times daily. Qty: 60 tablet, Refills: 11    bisoprolol (ZEBETA) 10 MG tablet Take 1 tablet (10 mg total) by mouth daily. Qty: 30 tablet, Refills: 3    budesonide (PULMICORT) 0.25 MG/2ML nebulizer solution Take 2 mLs (0.25 mg total) by nebulization 2 (two) times daily. Qty: 60 mL, Refills: 1    cholecalciferol (VITAMIN D) 1000 units tablet Take 1,000 Units by mouth daily.    hydroxypropyl methylcellulose / hypromellose (ISOPTO TEARS / GONIOVISC) 2.5 % ophthalmic solution Place 1 drop into both eyes 4 (four) times daily as needed for dry eyes.     ipratropium-albuterol (DUONEB) 0.5-2.5 (3) MG/3ML SOLN Take 3 mLs by nebulization 3 (three) times daily. Qty: 360 mL, Refills: 0    Menthol,  Topical Analgesic, (BIOFREEZE) 4 % GEL Apply 1 application topically 2 (two) times daily as needed (pain).    metolazone (ZAROXOLYN) 2.5 MG tablet Take 2.5 mg by mouth 2 (two) times a week. Monday and Thursday    nitroGLYCERIN (NITROSTAT) 0.4 MG SL tablet Place 1 tablet (0.4 mg total) under the tongue every 5 (five) minutes x 3 doses as needed for chest pain. Qty: 25 tablet, Refills: 1    PROVENTIL HFA 108 (90 Base) MCG/ACT inhaler Inhale 2 puffs into the lungs every 4 (four) hours as needed for wheezing or shortness of breath.  Refills: 4    Skin Protectants, Misc. (EUCERIN) cream Apply 1 application topically 2 (two) times daily.    torsemide (DEMADEX) 20 MG tablet Take 20-40 mg by mouth 2 (two) times daily. 3 tabs (60 mg) in the am and 2 tabs (40 mg) in the early afternoon.            Outstanding Labs/Studies   None  Duration of Discharge Encounter   Greater than 30 minutes including physician time.  Signed, Lorren Rossetti PA-C 06/19/2015, 12:04 PM

## 2015-06-19 NOTE — Progress Notes (Signed)
Patient need clarification of care plan. Patient educated about IV lasix and foley. Per patient, "I came here to see the doctor, the doctor did not tell me all that." Patient get short of breath and wheezing with activity. Patient refusing care and adamant about doing things on her own. Patient tried to go to the bathroom by herself a couple times, although unable to successfully do so. Will continue to monitor closely.

## 2015-06-19 NOTE — Progress Notes (Signed)
Patient Name: [name] Date of Encounter: 06/19/2015   SUBJECTIVE  Unsuccessful attempt to place IV x 2, refused afterwards. Thus unable to gave any diuretics. She has incontinence, refusing foley. Declined weight and glucose check as well. Stable SOB.   CURRENT MEDS . furosemide  80 mg Intravenous Q12H  . insulin aspart  0-15 Units Subcutaneous TID WC  . insulin aspart  0-5 Units Subcutaneous QHS  . sodium chloride flush  3 mL Intravenous Q12H    OBJECTIVE  Filed Vitals:   06/18/15 1800 06/18/15 2141 06/19/15 0335  BP: 142/74 129/77 152/74  Pulse: 70 58 45  Temp: 97.3 F (36.3 C) 97.4 F (36.3 C) 97.6 F (36.4 C)  TempSrc:  Oral Oral  Resp: SpO2: 100% 95% 90%    Intake/Output Summary (Last 24 hours) at 06/19/15 0846 Last data filed at 06/19/15 0730  Gross per 24 hour  Intake    240 ml  Output      0 ml  Net    240 ml   There were no vitals filed for this visit.  PHYSICAL EXAM  General: Pleasant, frail  NAD. Neuro: Alert and oriented X 3. Moves all extremities spontaneously. Psych: Normal affect. HEENT:  Normal  Neck: Supple without bruits. + JVD Lungs:  Resp regular and unlabored. Diminished breath sound with bibasilar rales.  Heart: RRR, no s3, s4, or murmurs. Abdomen: Soft, non-tender, non-distended, BS + x 4.  Extremities: No clubbing, cyanosis. 2-3+ BL Le  edema. DP/PT/Radials 2+ and equal bilaterally.  Accessory Clinical Findings  CBC  Recent Labs  06/18/15 1848  WBC 3.0*  NEUTROABS 1.8  HGB 11.6*  HCT 37.5  MCV 98.4  PLT 101*   Basic Metabolic Panel  Recent Labs  06/18/15 1848 06/19/15 0544  NA 147* 144  K 3.9 4.0  CL 100* 99*  CO2 34* 34*  GLUCOSE 107* 108*  BUN 62* 58*  CREATININE 1.78* 1.68*  CALCIUM 9.4 9.2  MG 2.0  --    Liver Function Tests  Recent Labs  06/18/15 1848  AST 31  ALT 22  ALKPHOS 92  BILITOT 0.9  PROT 6.8  ALBUMIN 3.6   No results for input(s): LIPASE, AMYLASE in the last 72  hours. Cardiac Enzymes  Recent Labs  06/18/15 1848 06/19/15 0014 06/19/15 0544  TROPONINI 0.07* 0.06* 0.06*   Hemoglobin A1C  Recent Labs  06/18/15 1848  HGBA1C 6.5*   Fasting Lipid Panel  TELE  Afib/aflutter at rate of 60-70s. Intermittently goes to 90s  Radiology/Studies  Dg Chest 2 View  05/30/2015  CLINICAL DATA:  Expiratory wheezing, increased edema, difficulty with nebulizer. History of hypertension, asthma, CHF, former smoker. EXAM: CHEST  2 VIEW COMPARISON:  CT chest 05/09/2015.  Chest 05/09/2015. FINDINGS: Postoperative changes in the mediastinum. Shallow inspiration. Small bilateral pleural effusions with basilar atelectasis. Heart size is enlarged. Pulmonary vascularity is increased. Developing perihilar airspace disease suggesting developing edema. Degenerative changes in the spine. IMPRESSION: Persistent cardiac enlargement with developing pulmonary vascular congestion, small bilateral pleural effusions, basilar atelectasis, and perihilar edema since previous study. Electronically Signed   By: Burman Nieves M.D.   On: 05/30/2015 19:57    ASSESSMENT AND PLAN  [name] is a 82 y.o. female with a history of CABG, TIA, HTN, DM, peripheral vascular disease with carotid artery stenosis, seizure, HL, chronic kidney disease stage IV, bradycardia, chronic systolic heart failure and Aflutter on eliquis who admitted directly from clinic for  IV diuresis.   1. Acute on chronic systolic CHF: - has gained 10lb in last month. At home takes torsemide  AM and  PM. Takes Metolazone 2.5mg  Monday and Thursday.  - unable to diuresis so far as unable to access IV site and she is refused overnight. She is aggress for one more attempt. Update nurse. If not successful then we will treat with PO meds. Another issue is incontinence. She is refusing foley. Concern for getting UTI.  - BNP of 984.  - daily weight and strict I&O as best.  - Her home bisoprolol  has not  order. Rate stable on tele.  Follow closely given hx of bradycardia.  - Last echo 04/2015 showed LV EF of 30-35%, moderate concentric hypertrophy, moderate MR, moderately reduce RV function, elevated CVP.   2. Hypertension: - minimally elevated.   3. Diabetes:  - On SSI here, however unable give as patient has decline to check blood glucose.   - A1c of 6.  4. Ischemic cardiomyopathy:  -  Not on ACE/ARB ue to poor Renal function. Needs meds review first before resuming medications.   5. PAF:  - HR is regular on exam. EKG still pending, will get one. Tele showed afib/aflutter at controlled rate.  - Will continue Eliquis 2.5mg  for anticoagulation. (ChADSVASC 9) twice daily and bisoprolol 10 mg.    Dispo: There is many medication which listed on H&P by Bjorn Loser that not listed on home medication. No home medication was ordered during admission. Will get pharmacy consult before starting. Start bisoprolol and eliquis for now.   Signed, Manson Passey PA-C Pager [phone]  Patient under hospice care No iv no reason to remain in hospital D/C home with outpatient hospice f/u  [facility]

## 2015-06-19 NOTE — Care Management Note (Signed)
Case Management Note Donn Pierini RN, BSN Unit 2W-Case Manager [phone]  Patient Details  Name: [name] MRN: [id number] Date of Birth: 1927/07/10  Subjective/Objective:      Pt admitted with CHF              Action/Plan: Pt lives at home with daughter- is a pt of [facility]- [facility]. CM to follow for d/c needs  Expected Discharge Date:                  Expected Discharge Plan:   Home/self care  In-House Referral:   social work  Discharge planning Services     Post Acute Care Choice:    Choice offered to:     DME Arranged:    DME Agency:     HH Arranged:    HH Agency:     Status of Service:   in progress  Medicare Important Message Given:    Date Medicare IM Given:    Medicare IM give by:    Date Additional Medicare IM Given:    Additional Medicare Important Message give by:     If discussed at Long Length of Stay Meetings, dates discussed:    Additional Comments:  Darrold Span, RN 06/19/2015, 10:51 AM

## 2015-06-19 NOTE — Progress Notes (Signed)
Pharmacist Heart Failure Core Measure Documentation  Assessment: [name] has an EF documented as 30-35% on 05/02/15 by Echo.  Rationale: Heart failure patients with left ventricular systolic dysfunction (LVSD) and an EF < 40% should be prescribed an angiotensin converting enzyme inhibitor (ACEI) or angiotensin receptor blocker (ARB) at discharge unless a contraindication is documented in the medical record.  This patient is not currently on an ACEI or ARB for HF.  This note is being placed in the record in order to provide documentation that a contraindication to the use of these agents is present for this encounter.  ACE Inhibitor or Angiotensin Receptor Blocker is contraindicated (specify all that apply)    ACEI allergy AND ARB allergy   Angioedema   Moderate or severe aortic stenosis   Hyperkalemia   Hypotension   Renal artery stenosis   Worsening renal function, preexisting renal disease or dysfunction   Harland German, Pharm D 06/19/2015 9:42 AM

## 2015-06-19 NOTE — Progress Notes (Signed)
Utilization review completed.  

## 2015-08-13 ENCOUNTER — Telehealth: Payer: Self-pay | Admitting: *Deleted

## 2015-08-13 NOTE — Telephone Encounter (Signed)
I spoke with patient's daughter Freddi CheCheryl Fowler to complete the 5539-month follow-up for [email or web address]  Study. Patient has been re-hospitalized for congestive heart failure on 06/18/15.I thanked patient for her participation in the study.

## 2015-09-11 ENCOUNTER — Telehealth: Payer: Self-pay | Admitting: Cardiology

## 2015-09-11 NOTE — Telephone Encounter (Signed)
Dr Elbert LionsHochrien spoke to Dr Murtis SinkWiliams

## 2015-09-11 NOTE — Telephone Encounter (Signed)
Dr [place] LionsHochrien did talk to Dr Mayford KnifeWilliams on 05-24-15

## 2017-12-10 IMAGING — CR DG CHEST 2V
1 series · 1 of 1 positions shown · non-contrast
Comparison: 10/07/2011.

CLINICAL DATA: Initial encounter for several day history of
shortness of breath.

EXAM:
CHEST  2 VIEW

[view not recorded]
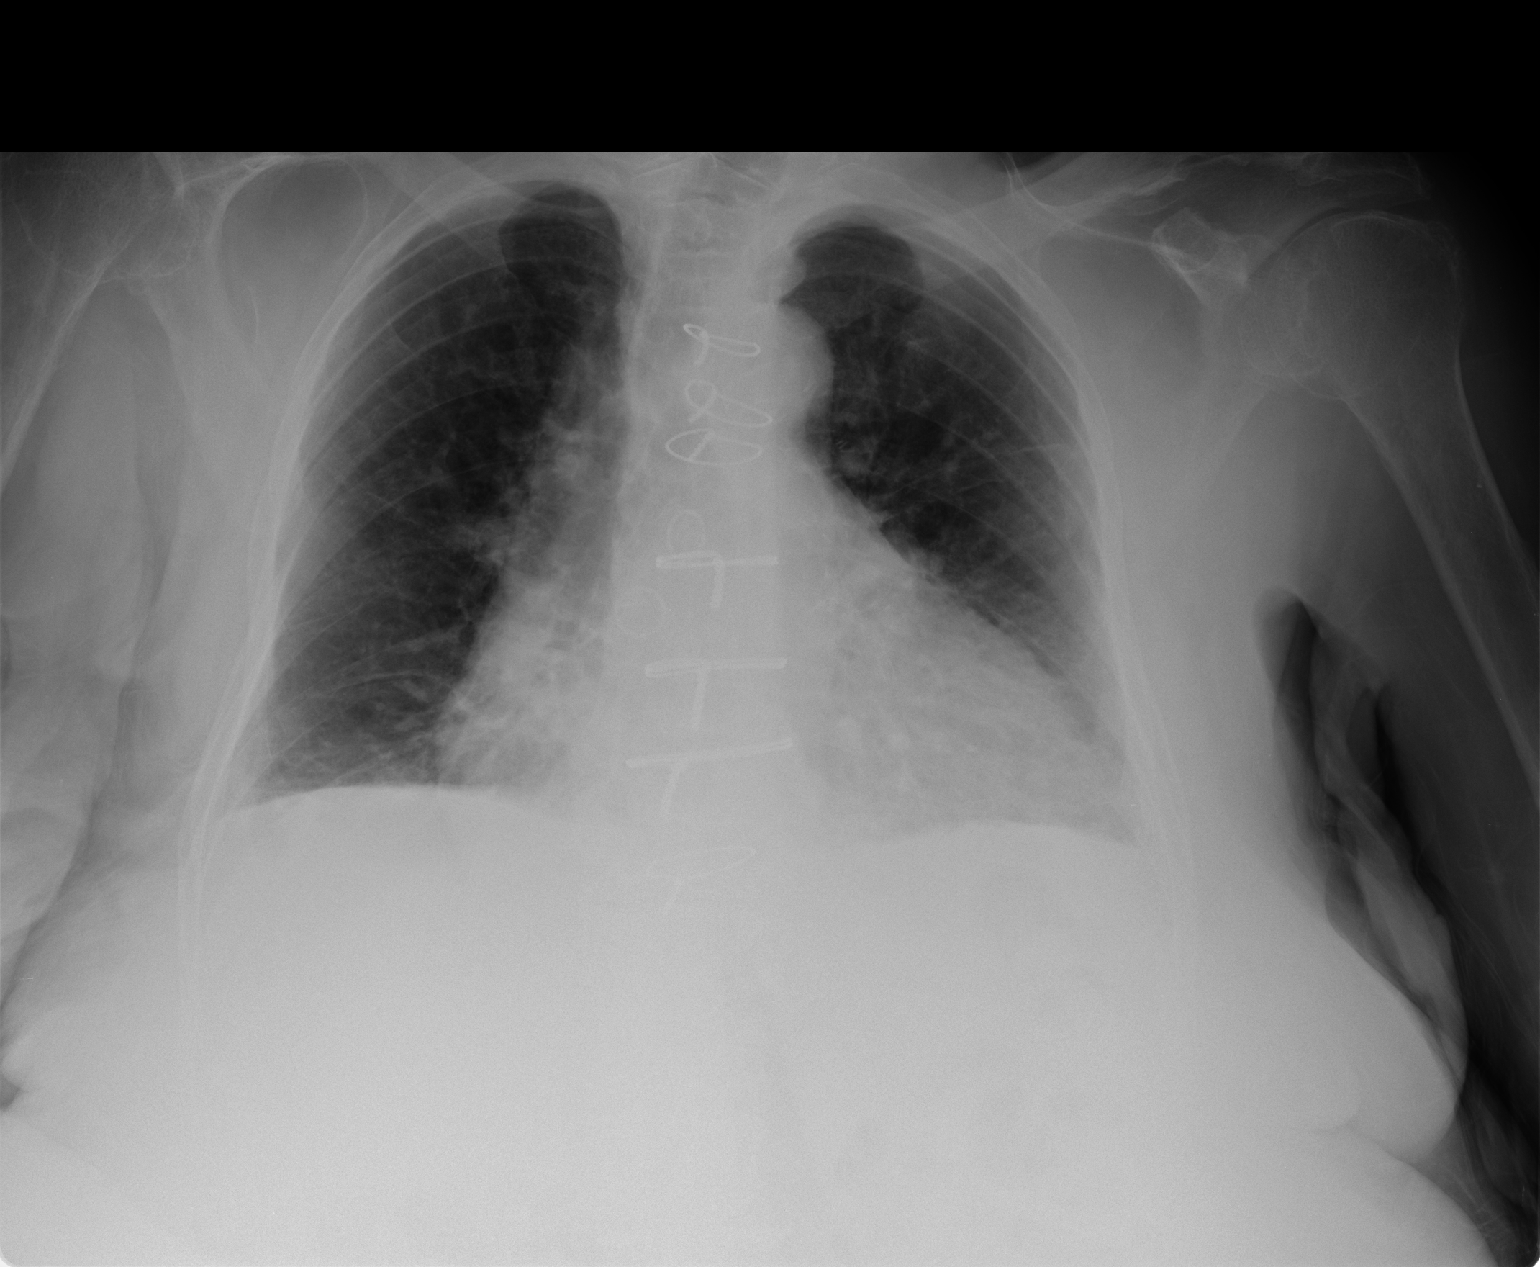

[1 of 1 positions shown; findings below may reference images not displayed]

FINDINGS: AP and lateral views of the chest show hyperexpansion without focal
airspace consolidation. No pulmonary edema or pleural effusion.
Interstitial markings are diffusely coarsened with chronic features.
The cardio pericardial silhouette is enlarged. Patient is status
post CABG. Bones are diffusely demineralized.
IMPRESSION: Cardiomegaly with hyperexpansion and chronic interstitial lung
disease. No acute cardiopulmonary findings.

## 2017-12-15 IMAGING — DX DG CHEST 2V
2 series · 2 of 2 positions shown · non-contrast
Comparison: 05/02/2015

CLINICAL DATA: Dyspnea, wheezing, cough and decreased breath
sounds. History of cardiomyopathy with heart failure.

EXAM:
CHEST - 2 VIEW

[w chest lat]
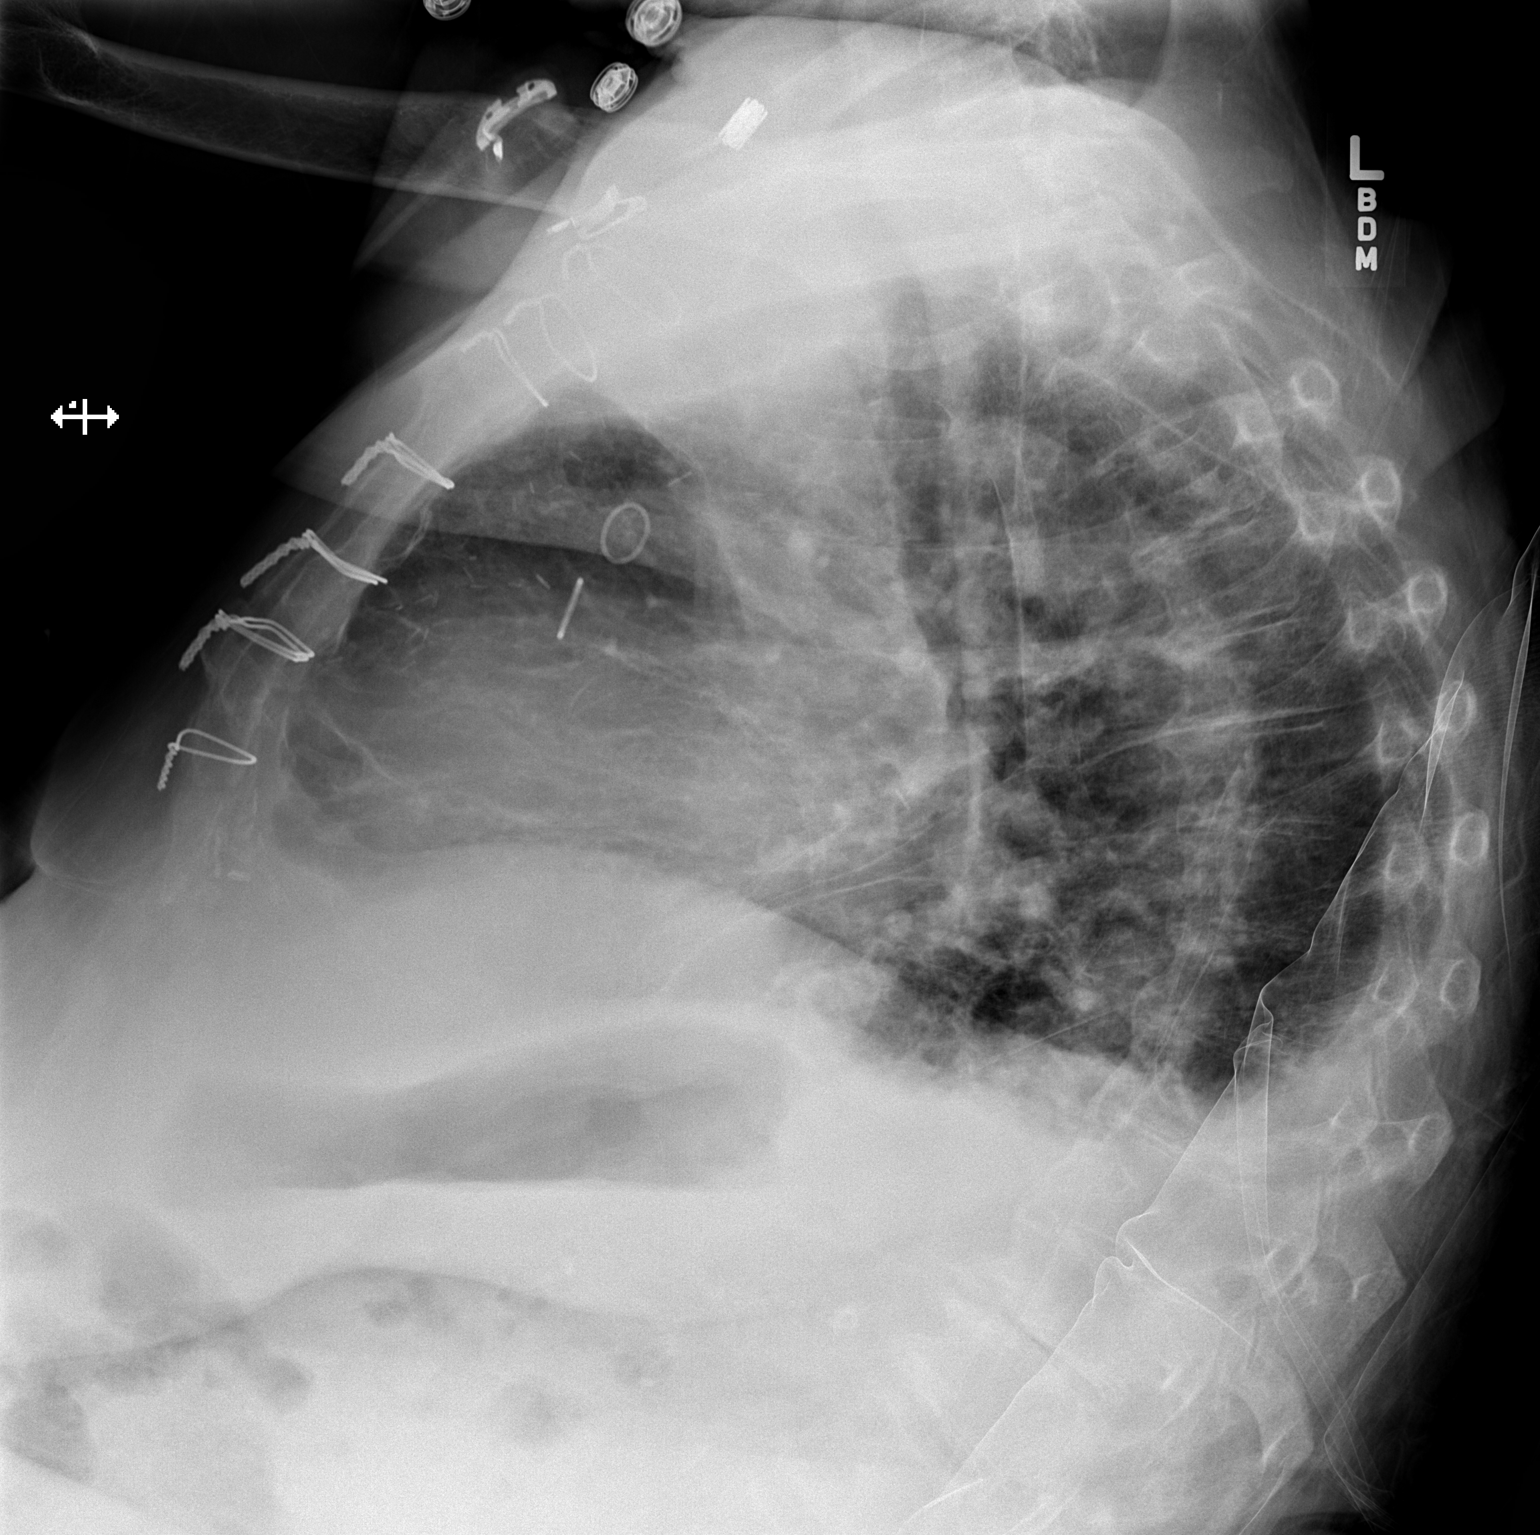

[x chest ap]
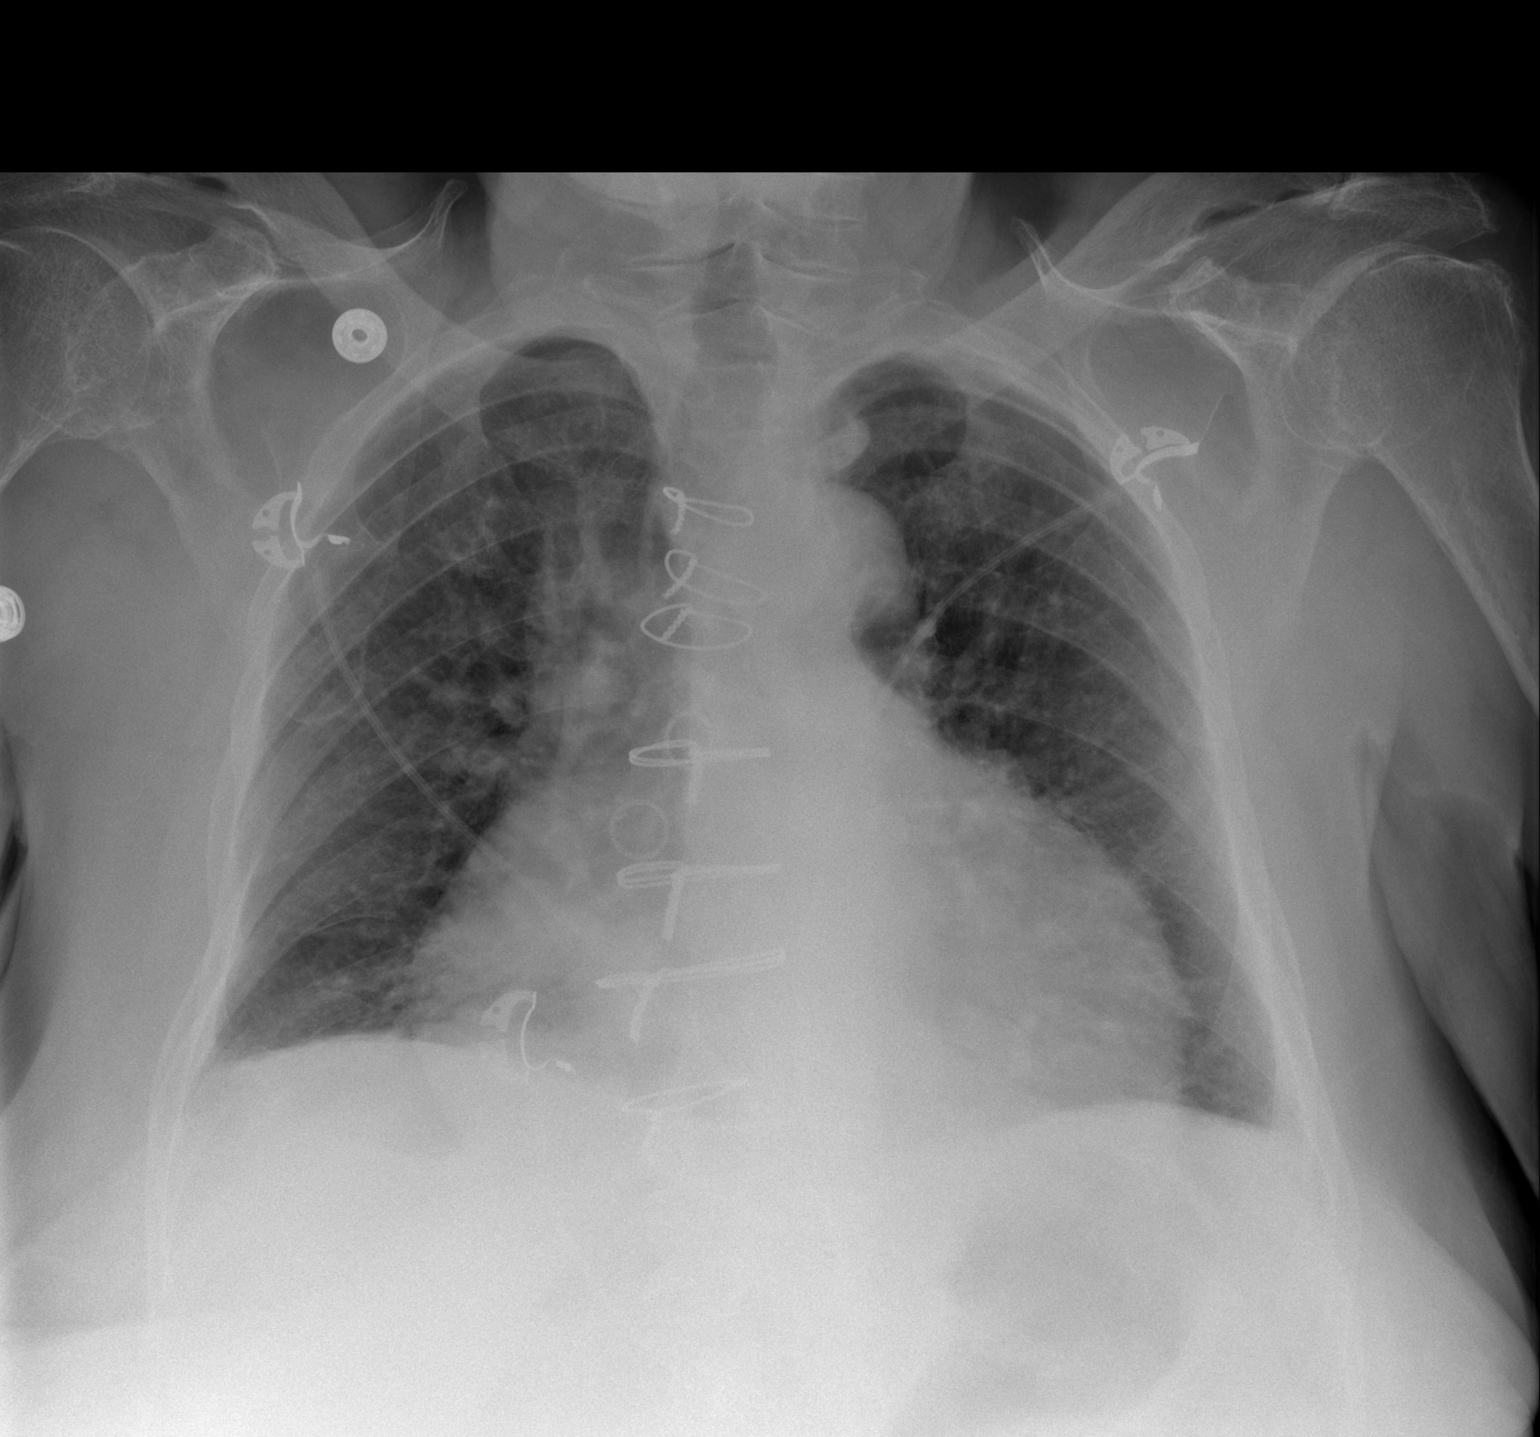

[2 of 2 positions shown; findings below may reference images not displayed]

FINDINGS: Stable moderate cardiac enlargement. Mild pulmonary venous
hypertensive changes remain without evidence of overt airspace
edema. On the lateral view there is suggestion of very small
bilateral posterior pleural effusions. Scattered scarring and
atelectasis present. No focal airspace consolidation.
IMPRESSION: Stable cardiomegaly with pulmonary venous hypertension and no overt
airspace edema. Small bilateral posterior pleural effusions are
present.

## 2017-12-18 IMAGING — CR DG CHEST 1V PORT
1 series · 1 of 1 positions shown · non-contrast
Comparison: 05/06/2015

CLINICAL DATA: SOB, productive cough, and wheezing started last
night. NO FEVER. ? Fluid overload. Hx HTN, acute on chronic CHF,
A-Flutter, MI, CABG (5771), diabetes, and asthma.

EXAM:
PORTABLE CHEST 1 VIEW

[AP]
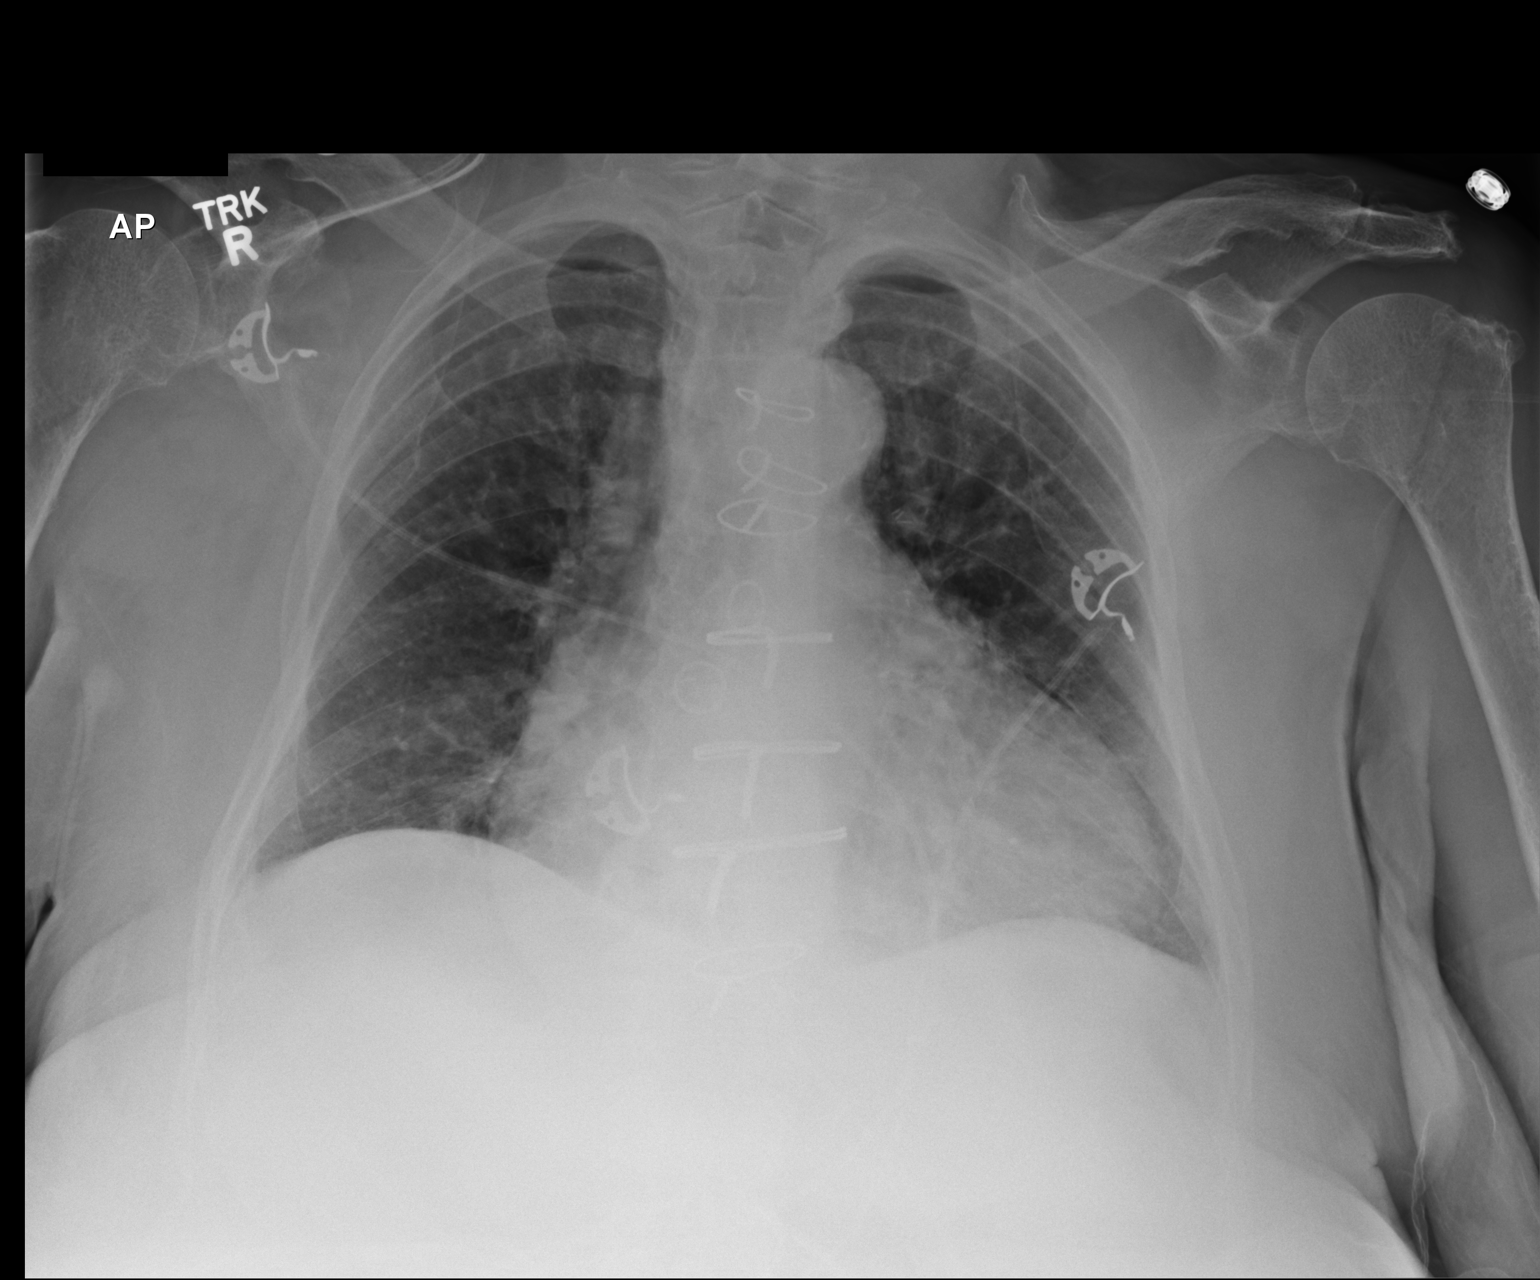

[1 of 1 positions shown; findings below may reference images not displayed]

FINDINGS: Cardiomediastinal silhouette is within normal limits. The lungs are
clear. There are no focal consolidations or pleural effusions. No
evidence for pulmonary edema.
IMPRESSION: Cardiomegaly.

## 2017-12-18 IMAGING — CT CT CHEST HIGH RESOLUTION W/O CM
2 of 6 series · 13 of 36 positions shown, 16 images · non-contrast
Comparison: No priors.

CLINICAL DATA: 87-year-old female with history of dyspnea. Evaluate
for interstitial lung disease.

EXAM:
CT CHEST WITHOUT CONTRAST
TECHNIQUE: Multidetector CT imaging of the chest was performed following the
standard protocol without intravenous contrast. High resolution
imaging of the lungs, as well as inspiratory and expiratory imaging,
was performed.

[Series 5: high resolution 5.0 b40f · axial · 0.62mm/px · z∈[-116,+29]mm · 10 of 35 slices shown, 13 images]
[im 3/35  mediastinal]
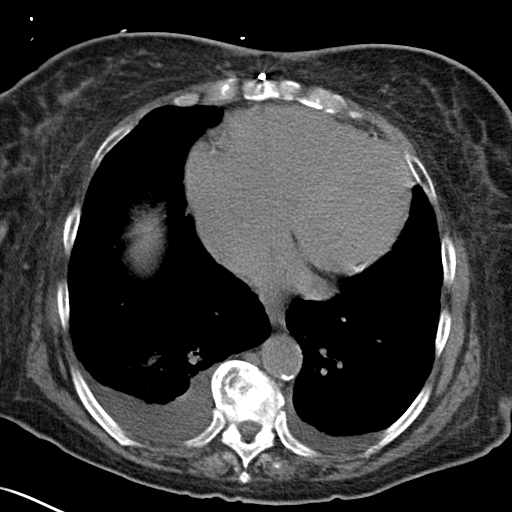
[im 3/35  lung]
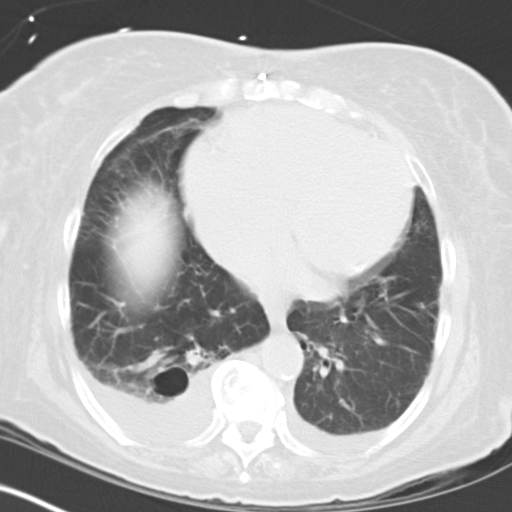
[im 6/35  lung]
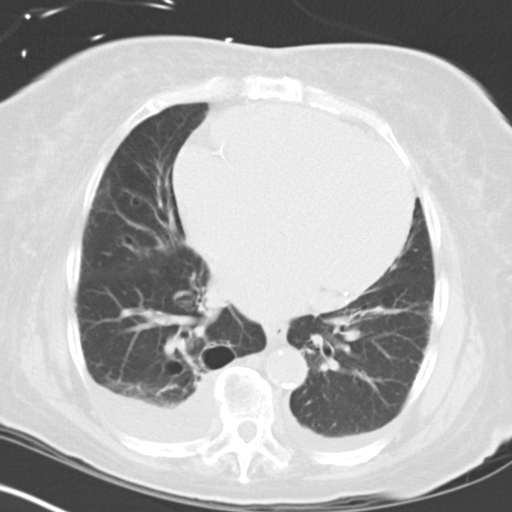
[im 11/35  lung]
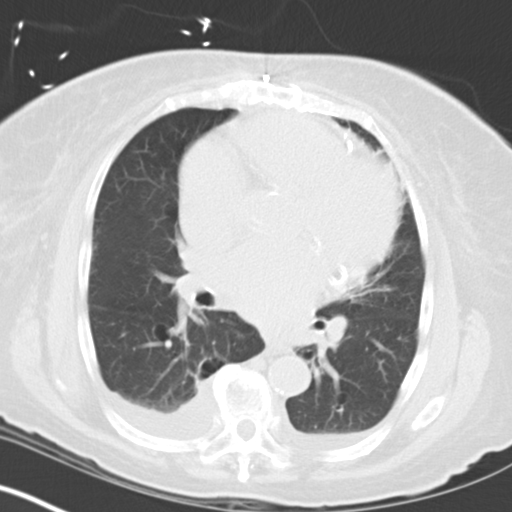
[im 14/35  lung]
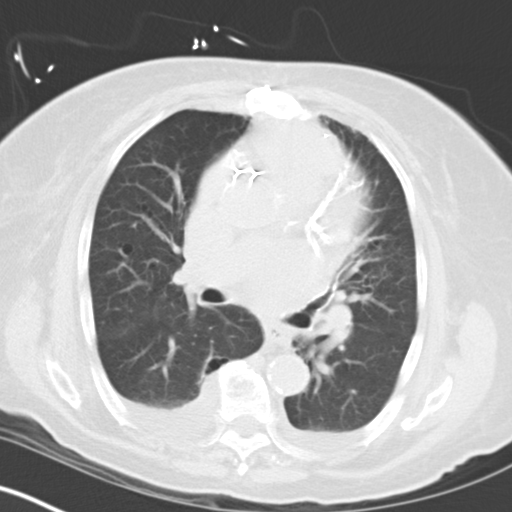
[im 16/35  mediastinal]
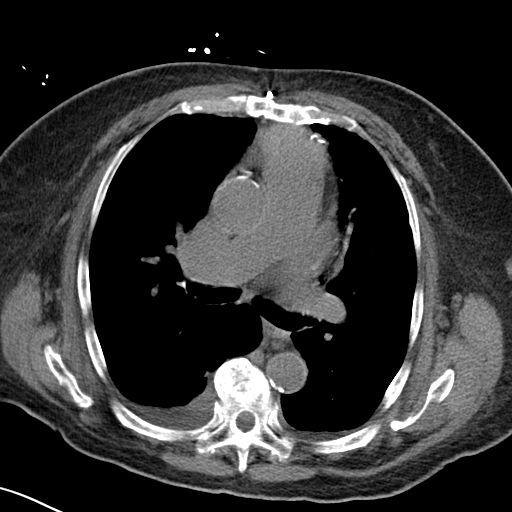
[im 16/35  lung]
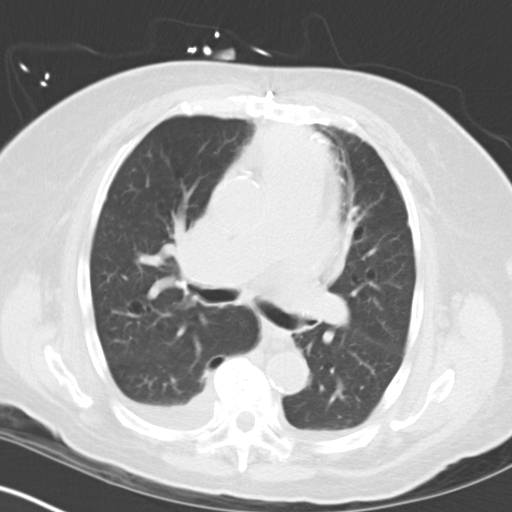
[im 19/35  lung]
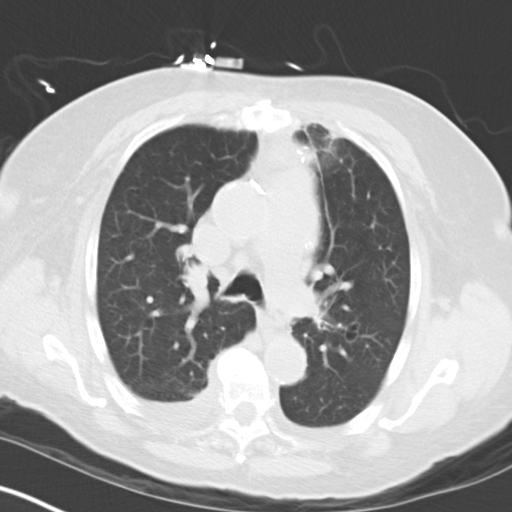
[im 21/35  lung]
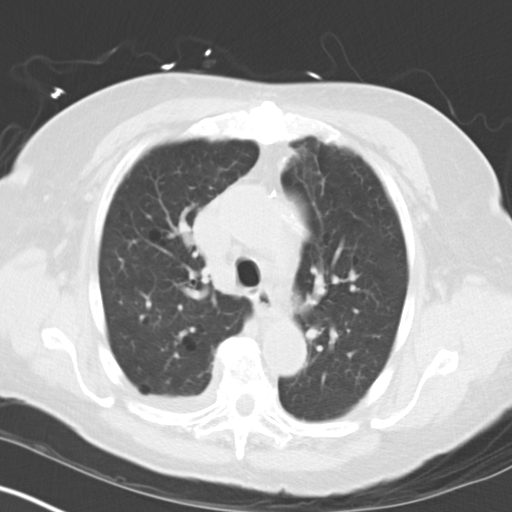
[im 27/35  lung]
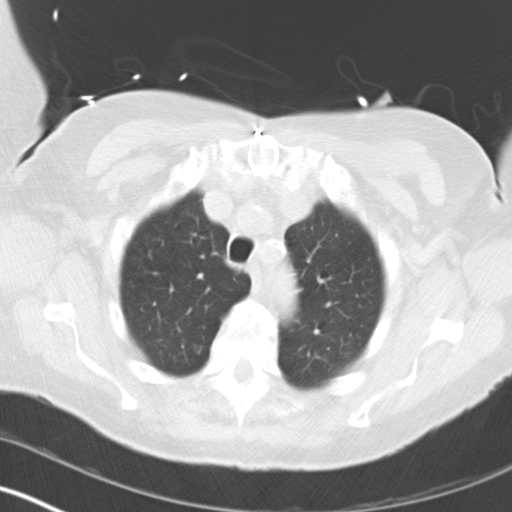
[im 29/35  mediastinal]
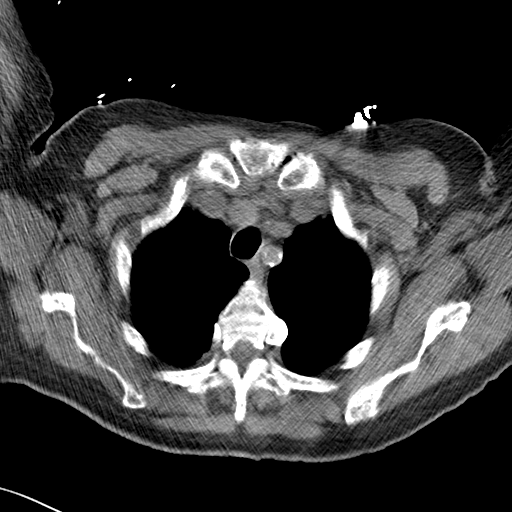
[im 29/35  lung]
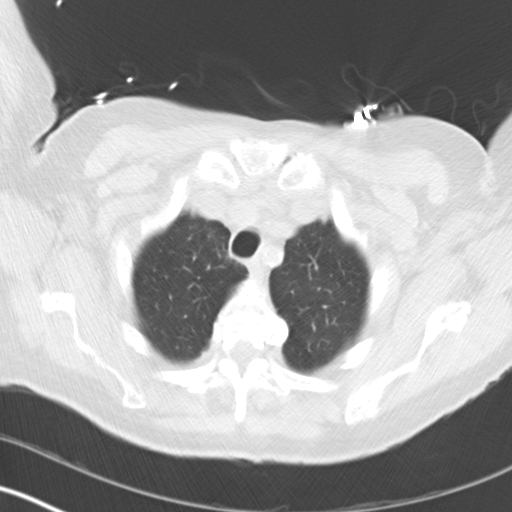
[im 32/35  lung]
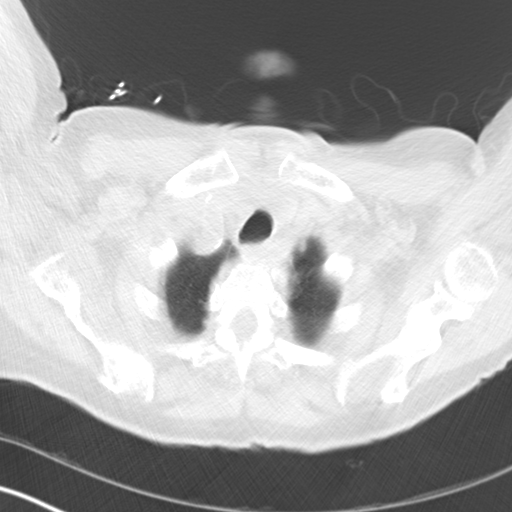

[Series 8: coronal · coronal · 0.34mm/px · 3 of 53 slices shown]
[im 11/53  lung]
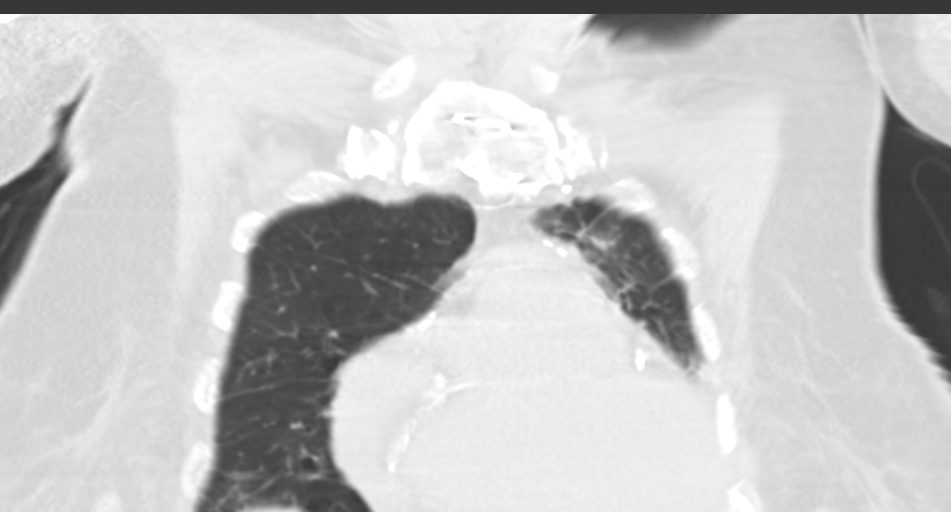
[im 21/53  lung]
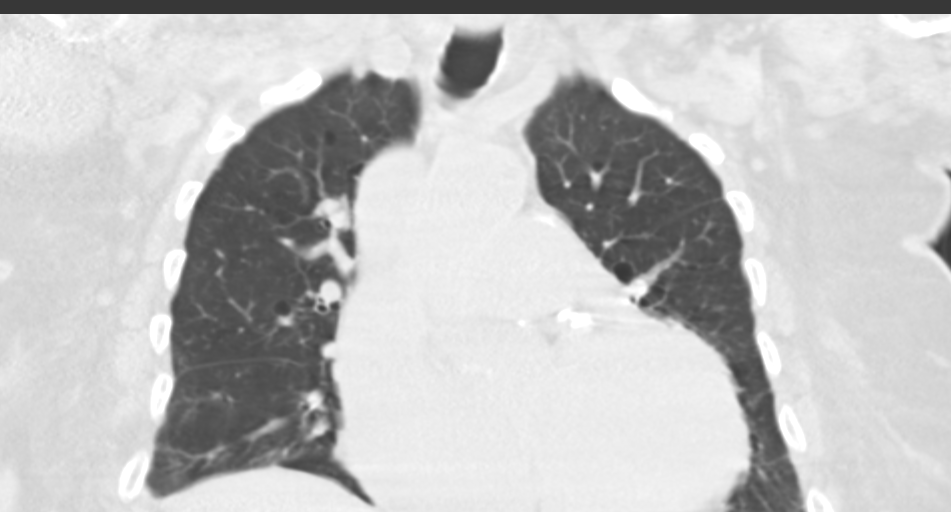
[im 32/53  lung]
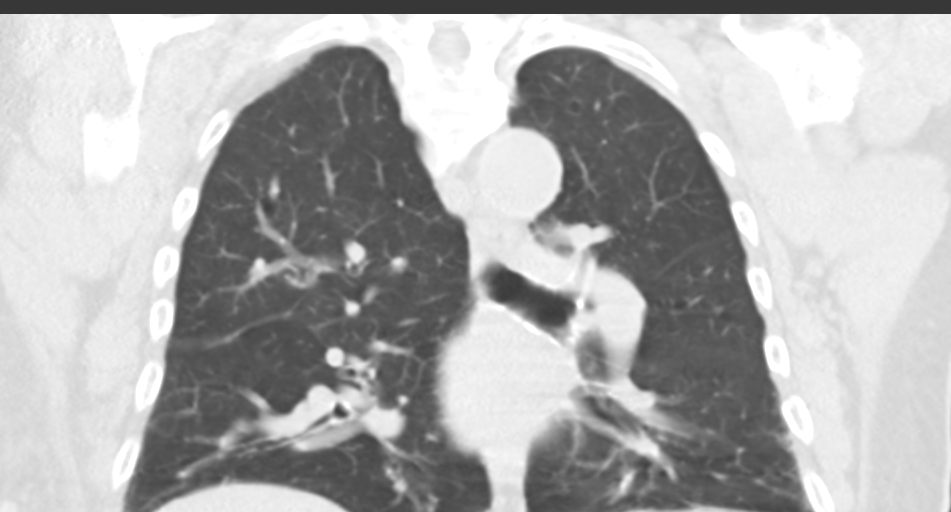

[13 of 36 positions shown; findings below may reference images not displayed]

FINDINGS: Mediastinum/Lymph Nodes: Heart size is mildly enlarged. There is no
significant pericardial fluid, thickening or pericardial
calcification. There is atherosclerosis of the thoracic aorta, the
great vessels of the mediastinum and the coronary arteries,
including calcified atherosclerotic plaque in the left main, left
anterior descending, left circumflex and right coronary arteries.
Status post median sternotomy for CABG, including [REDACTED] to the LAD.
Mild dilatation of the pulmonic trunk (3.4 cm in diameter). No
pathologically enlarged mediastinal or hilar lymph nodes. Please
note that accurate exclusion of hilar adenopathy is limited on
noncontrast CT scans. Esophagus is unremarkable in appearance.

Lungs/Pleura: Small bilateral pleural effusions (right greater than
left) with some associated passive atelectasis in the lower lobes of
the lungs bilaterally. No acute consolidative airspace disease. No
suspicious appearing pulmonary nodules or masses. High-resolution
images demonstrate no significant regions of ground-glass
attenuation, subpleural reticulation, parenchymal banding or frank
honeycombing to suggest interstitial lung disease. There is minimal
bronchiectasis and some thick walled cystic change in the medial
aspects of the lower lobes of the lungs bilaterally, strongly
favored to represent sequela of prior infection. Inspiratory and
expiratory imaging is unremarkable. Very mild centrilobular and
paraseptal emphysema.

Upper abdomen: Unremarkable.

Musculoskeletal: There are no aggressive appearing lytic or blastic
lesions noted in the visualized portions of the skeleton. Median
sternotomy wires.
IMPRESSION: 1. No evidence of interstitial lung disease.
2. There are some areas of mild fibrotic changes in the medial
aspects of the lower lobes of the lungs bilaterally with some
associated cylindrical bronchiectasis, strongly favored to represent
scarring at sites of of prior infection.
3. Mild diffuse bronchial wall thickening with mild centrilobular
and paraseptal emphysema.
4. Small bilateral pleural effusions (right greater than left).
5. Mild cardiomegaly.
6. Dilatation of the pulmonic trunk (3.4 cm in diameter), suggesting
pulmonary arterial hypertension.
7. Atherosclerosis, including left main and 3 vessel coronary artery
disease. Status post median sternotomy for CABG, including [REDACTED] to
the LAD.
